# Patient Record
Sex: Male | Born: 1955
Health system: Southern US, Community
[De-identification: ages and names within clinical notes are randomized; demographics above are authoritative.]

## PROBLEM LIST (undated history)

## (undated) DIAGNOSIS — I10 Essential (primary) hypertension: Secondary | ICD-10-CM

## (undated) DIAGNOSIS — A048 Other specified bacterial intestinal infections: Secondary | ICD-10-CM

## (undated) DIAGNOSIS — J309 Allergic rhinitis, unspecified: Secondary | ICD-10-CM

## (undated) DIAGNOSIS — K259 Gastric ulcer, unspecified as acute or chronic, without hemorrhage or perforation: Secondary | ICD-10-CM

## (undated) DIAGNOSIS — E785 Hyperlipidemia, unspecified: Secondary | ICD-10-CM

## (undated) DIAGNOSIS — R011 Cardiac murmur, unspecified: Secondary | ICD-10-CM

## (undated) DIAGNOSIS — K922 Gastrointestinal hemorrhage, unspecified: Secondary | ICD-10-CM

## (undated) HISTORY — DX: Allergic rhinitis, unspecified: J30.9

## (undated) HISTORY — DX: Gastrointestinal hemorrhage, unspecified: K92.2

## (undated) HISTORY — DX: Other specified bacterial intestinal infections: A04.8

## (undated) HISTORY — DX: Cardiac murmur, unspecified: R01.1

## (undated) HISTORY — DX: Essential (primary) hypertension: I10

## (undated) HISTORY — PX: CLAVICLE SURGERY: SHX598

## (undated) HISTORY — DX: Gastric ulcer, unspecified as acute or chronic, without hemorrhage or perforation: K25.9

## (undated) HISTORY — PX: NASAL SEPTUM SURGERY: SHX37

## (undated) HISTORY — DX: Hyperlipidemia, unspecified: E78.5

## (undated) HISTORY — PX: APPENDECTOMY: SHX54

---

## 1998-12-25 ENCOUNTER — Emergency Department (HOSPITAL_COMMUNITY): Admission: EM | Admit: 1998-12-25 | Discharge: 1998-12-25 | Payer: Self-pay | Admitting: Emergency Medicine

## 2001-08-02 ENCOUNTER — Emergency Department (HOSPITAL_COMMUNITY): Admission: EM | Admit: 2001-08-02 | Discharge: 2001-08-02 | Payer: Self-pay | Admitting: Internal Medicine

## 2001-08-29 ENCOUNTER — Emergency Department (HOSPITAL_COMMUNITY): Admission: EM | Admit: 2001-08-29 | Discharge: 2001-08-29 | Payer: Self-pay | Admitting: Emergency Medicine

## 2001-10-16 ENCOUNTER — Emergency Department (HOSPITAL_COMMUNITY): Admission: EM | Admit: 2001-10-16 | Discharge: 2001-10-16 | Payer: Self-pay | Admitting: Emergency Medicine

## 2003-06-29 ENCOUNTER — Emergency Department (HOSPITAL_COMMUNITY): Admission: EM | Admit: 2003-06-29 | Discharge: 2003-06-29 | Payer: Self-pay | Admitting: Emergency Medicine

## 2004-11-24 ENCOUNTER — Ambulatory Visit: Payer: Self-pay | Admitting: Internal Medicine

## 2005-02-18 ENCOUNTER — Ambulatory Visit: Payer: Self-pay | Admitting: Internal Medicine

## 2006-03-25 ENCOUNTER — Ambulatory Visit: Payer: Self-pay | Admitting: Internal Medicine

## 2006-06-14 ENCOUNTER — Ambulatory Visit: Payer: Self-pay | Admitting: Internal Medicine

## 2007-06-23 ENCOUNTER — Ambulatory Visit: Payer: Self-pay | Admitting: Internal Medicine

## 2007-07-25 DIAGNOSIS — I10 Essential (primary) hypertension: Secondary | ICD-10-CM

## 2007-07-25 HISTORY — DX: Essential (primary) hypertension: I10

## 2007-10-24 ENCOUNTER — Ambulatory Visit: Payer: Self-pay | Admitting: Internal Medicine

## 2007-10-24 DIAGNOSIS — L0201 Cutaneous abscess of face: Secondary | ICD-10-CM | POA: Insufficient documentation

## 2007-10-24 DIAGNOSIS — J309 Allergic rhinitis, unspecified: Secondary | ICD-10-CM

## 2007-10-24 DIAGNOSIS — L03211 Cellulitis of face: Secondary | ICD-10-CM

## 2007-10-24 HISTORY — DX: Allergic rhinitis, unspecified: J30.9

## 2008-03-06 ENCOUNTER — Ambulatory Visit: Payer: Self-pay | Admitting: Internal Medicine

## 2008-03-06 DIAGNOSIS — J069 Acute upper respiratory infection, unspecified: Secondary | ICD-10-CM | POA: Insufficient documentation

## 2008-07-09 ENCOUNTER — Encounter: Payer: Self-pay | Admitting: Internal Medicine

## 2008-09-04 ENCOUNTER — Ambulatory Visit: Payer: Self-pay | Admitting: Internal Medicine

## 2008-09-19 ENCOUNTER — Telehealth: Payer: Self-pay | Admitting: Internal Medicine

## 2009-07-25 ENCOUNTER — Telehealth: Payer: Self-pay | Admitting: Internal Medicine

## 2009-07-30 ENCOUNTER — Ambulatory Visit: Payer: Self-pay | Admitting: Internal Medicine

## 2009-08-30 ENCOUNTER — Telehealth: Payer: Self-pay | Admitting: Internal Medicine

## 2010-10-09 ENCOUNTER — Ambulatory Visit: Payer: Self-pay | Admitting: Internal Medicine

## 2010-10-09 LAB — CONVERTED CEMR LAB
ALT: 32 units/L (ref 0–53)
AST: 26 units/L (ref 0–37)
Albumin: 3.9 g/dL (ref 3.5–5.2)
Alkaline Phosphatase: 53 units/L (ref 39–117)
BUN: 13 mg/dL (ref 6–23)
Basophils Absolute: 0 10*3/uL (ref 0.0–0.1)
Basophils Relative: 0.4 % (ref 0.0–3.0)
Bilirubin Urine: NEGATIVE
Bilirubin, Direct: 0.1 mg/dL (ref 0.0–0.3)
Blood in Urine, dipstick: NEGATIVE
CO2: 30 meq/L (ref 19–32)
Calcium: 9.3 mg/dL (ref 8.4–10.5)
Chloride: 98 meq/L (ref 96–112)
Cholesterol: 220 mg/dL — ABNORMAL HIGH (ref 0–200)
Creatinine, Ser: 0.9 mg/dL (ref 0.4–1.5)
Direct LDL: 163 mg/dL
Eosinophils Absolute: 0.1 10*3/uL (ref 0.0–0.7)
Eosinophils Relative: 2.2 % (ref 0.0–5.0)
GFR calc non Af Amer: 115.73 mL/min (ref >60)
Glucose, Bld: 98 mg/dL (ref 70–99)
Glucose, Urine, Semiquant: NEGATIVE
HCT: 39.8 % (ref 39.0–52.0)
HDL: 42.5 mg/dL (ref >39.00)
Hemoglobin: 13.7 g/dL (ref 13.0–17.0)
Ketones, urine, test strip: NEGATIVE
Lymphocytes Relative: 50 % — ABNORMAL HIGH (ref 12.0–46.0)
Lymphs Abs: 1.5 10*3/uL (ref 0.7–4.0)
MCHC: 34.5 g/dL (ref 30.0–36.0)
MCV: 96.3 fL (ref 78.0–100.0)
Monocytes Absolute: 0.3 10*3/uL (ref 0.1–1.0)
Monocytes Relative: 8.9 % (ref 3.0–12.0)
Neutro Abs: 1.2 10*3/uL — ABNORMAL LOW (ref 1.4–7.7)
Neutrophils Relative %: 38.5 % — ABNORMAL LOW (ref 43.0–77.0)
Nitrite: NEGATIVE
PSA: 1.47 ng/mL (ref 0.10–4.00)
Platelets: 236 10*3/uL (ref 150.0–400.0)
Potassium: 3.9 meq/L (ref 3.5–5.1)
Protein, U semiquant: NEGATIVE
RBC: 4.13 M/uL — ABNORMAL LOW (ref 4.22–5.81)
RDW: 14.4 % (ref 11.5–14.6)
Sodium: 136 meq/L (ref 135–145)
Specific Gravity, Urine: 1.01
TSH: 0.89 microintl units/mL (ref 0.35–5.50)
Total Bilirubin: 0.8 mg/dL (ref 0.3–1.2)
Total CHOL/HDL Ratio: 5
Total Protein: 7.1 g/dL (ref 6.0–8.3)
Triglycerides: 62 mg/dL (ref 0.0–149.0)
Urobilinogen, UA: 0.2
VLDL: 12.4 mg/dL (ref 0.0–40.0)
WBC Urine, dipstick: NEGATIVE
WBC: 3 10*3/uL — ABNORMAL LOW (ref 4.5–10.5)
pH: 7

## 2010-10-16 ENCOUNTER — Ambulatory Visit: Payer: Self-pay | Admitting: Internal Medicine

## 2010-10-16 DIAGNOSIS — E7800 Pure hypercholesterolemia, unspecified: Secondary | ICD-10-CM | POA: Insufficient documentation

## 2010-10-16 DIAGNOSIS — E785 Hyperlipidemia, unspecified: Secondary | ICD-10-CM

## 2010-10-16 HISTORY — DX: Hyperlipidemia, unspecified: E78.5

## 2010-11-05 ENCOUNTER — Telehealth: Payer: Self-pay | Admitting: Internal Medicine

## 2010-12-11 ENCOUNTER — Ambulatory Visit: Payer: Self-pay | Admitting: Internal Medicine

## 2010-12-12 DIAGNOSIS — R109 Unspecified abdominal pain: Secondary | ICD-10-CM | POA: Insufficient documentation

## 2011-01-29 NOTE — Assessment & Plan Note (Signed)
Summary: CPX//SLM   Vital Signs:  Patient profile:   55 year old male Height:      70 inches Weight:      174 pounds BMI:     25.06 Temp:     98.2 degrees F oral Pulse rate:   72 / minute Resp:     14 per minute BP sitting:   130 / 82  (left arm)  Vitals Entered By: Willy Eddy, LPN (October 16, 2010 3:01 PM) CC: cpx-pt didnt want to put on a gown Is Patient Diabetic? No   CC:  cpx-pt didnt want to put on a gown.  History of Present Illness: 55 -year-old patient who is seen today for a wellness exam.  He has a history of treated hypertension and has done quite well.  no concerns or complaints  Preventive Screening-Counseling & Management  Caffeine-Diet-Exercise     Does Patient Exercise: yes  Current Problems (verified): 1)  Contact With or Exposure To Venereal Diseases  (ICD-V01.6) 2)  Uri  (ICD-465.9) 3)  Cellulitis, Face, Left  (ICD-682.0) 4)  Allergic Rhinitis  (ICD-477.9) 5)  Hypertension  (ICD-401.9)  Current Medications (verified): 1)  Viagra 50 Mg Tabs (Sildenafil Citrate) .... One Tab By Mouth At Bedtime As Needed 2)  Fexofenadine Hcl 180 Mg  Tabs (Fexofenadine Hcl) .Marland Kitchen.. 1 Once Daily 3)  Benazepril-Hydrochlorothiazide 20-25 Mg Tabs (Benazepril-Hydrochlorothiazide) .... One Daily  Allergies (verified): 1)  ! Doxycycline  Contraindications/Deferment of Procedures/Staging:    Test/Procedure: FLU VAX    Reason for deferment: patient declined     Test/Procedure: Colonoscopy    Reason for deferment: patient declined   Past History:  Past Medical History: Hypertension Allergic rhinitis Hyperlipidemia  Past Surgical History: ENT surgery for septal deviation, turbinate hypertrophy, and nasal obstruction 2006 Appendectomy  age 26 broken collarbone, age 42 colonoscopy none  Family History: Reviewed history from 09/04/2008 and no changes required. positive for hypertension Father died age 12 COPD Mother died age 38 aneurysm  3 brothers- HTN 2  sisters-   Social History: Reviewed history from 09/04/2008 and no changes required. Single Regular exercise-yes works 2  jobsDoes Patient Exercise:  yes  Review of Systems  The patient denies anorexia, fever, weight loss, weight gain, vision loss, decreased hearing, hoarseness, chest pain, syncope, dyspnea on exertion, peripheral edema, prolonged cough, headaches, hemoptysis, abdominal pain, melena, hematochezia, severe indigestion/heartburn, hematuria, incontinence, genital sores, muscle weakness, suspicious skin lesions, transient blindness, difficulty walking, depression, unusual weight change, abnormal bleeding, enlarged lymph nodes, angioedema, breast masses, and testicular masses.    Physical Exam  General:  Well-developed,well-nourished,in no acute distress; alert,appropriate and cooperative throughout examination Head:  Normocephalic and atraumatic without obvious abnormalities. No apparent alopecia or balding. Eyes:  No corneal or conjunctival inflammation noted. EOMI. Perrla. Funduscopic exam benign, without hemorrhages, exudates or papilledema. Vision grossly normal. Ears:  External ear exam shows no significant lesions or deformities.  Otoscopic examination reveals clear canals, tympanic membranes are intact bilaterally without bulging, retraction, inflammation or discharge. Hearing is grossly normal bilaterally. Nose:  External nasal examination shows no deformity or inflammation. Nasal mucosa are pink and moist without lesions or exudates. Mouth:  Oral mucosa and oropharynx without lesions or exudates.  Teeth in good repair. Neck:  No deformities, masses, or tenderness noted. Chest Wall:  No deformities, masses, tenderness or gynecomastia noted. Breasts:  No masses or gynecomastia noted Lungs:  Normal respiratory effort, chest expands symmetrically. Lungs are clear to auscultation, no crackles or wheezes. Heart:  Normal  rate and regular rhythm. S1 and S2 normal without gallop,  murmur, click, rub or other extra sounds. Abdomen:  Bowel sounds positive,abdomen soft and non-tender without masses, organomegaly or hernias noted. Rectal:  No external abnormalities noted. Normal sphincter tone. No rectal masses or tenderness. Genitalia:  Testes bilaterally descended without nodularity, tenderness or masses. No scrotal masses or lesions. No penis lesions or urethral discharge. Prostate:  Prostate gland firm and smooth, no enlargement, nodularity, tenderness, mass, asymmetry or induration. Msk:  No deformity or scoliosis noted of thoracic or lumbar spine.   Pulses:  R and L carotid,radial,femoral,dorsalis pedis and posterior tibial pulses are full and equal bilaterally Extremities:  No clubbing, cyanosis, edema, or deformity noted with normal full range of motion of all joints.   Neurologic:  No cranial nerve deficits noted. Station and gait are normal. Plantar reflexes are down-going bilaterally. DTRs are symmetrical throughout. Sensory, motor and coordinative functions appear intact. Skin:  Intact without suspicious lesions or rashes Cervical Nodes:  No lymphadenopathy noted Axillary Nodes:  No palpable lymphadenopathy Inguinal Nodes:  No significant adenopathy Psych:  Cognition and judgment appear intact. Alert and cooperative with normal attention span and concentration. No apparent delusions, illusions, hallucinations   Impression & Recommendations:  Problem # 1:  Preventive Health Care (ICD-V70.0)  Complete Medication List: 1)  Viagra 50 Mg Tabs (Sildenafil citrate) .... One tab by mouth at bedtime as needed 2)  Fexofenadine Hcl 180 Mg Tabs (Fexofenadine hcl) .Marland Kitchen.. 1 once daily 3)  Benazepril-hydrochlorothiazide 20-25 Mg Tabs (Benazepril-hydrochlorothiazide) .... One daily  Patient Instructions: 1)  Limit your Sodium (Salt). 2)  It is important that you exercise regularly at least 20 minutes 5 times a week. If you develop chest pain, have severe difficulty breathing,  or feel very tired , stop exercising immediately and seek medical attention. 3)  Schedule a colonoscopy/sigmoidoscopy to help detect colon cancer. 4)  Check your Blood Pressure regularly. If it is above: 150/90  you should make an appointment. 5)  Please schedule a follow-up appointment in 6 months. Prescriptions: BENAZEPRIL-HYDROCHLOROTHIAZIDE 20-25 MG TABS (BENAZEPRIL-HYDROCHLOROTHIAZIDE) one daily  #90 Tablet x 6   Entered and Authorized by:   Gordy Savers  MD   Signed by:   Gordy Savers  MD on 10/16/2010   Method used:   Print then Give to Patient   RxID:   [id number] VIAGRA 50 MG TABS (SILDENAFIL CITRATE) one tab by mouth at bedtime as needed  #11 x 6   Entered and Authorized by:   Gordy Savers  MD   Signed by:   Gordy Savers  MD on 10/16/2010   Method used:   Print then Give to Patient   RxID:   [id number]    Orders Added: 1)  Est. Patient 40-64 years [[address]]

## 2011-01-29 NOTE — Assessment & Plan Note (Signed)
Summary: stomach virus//ccm   Vital Signs:  Patient profile:   55 year old male Weight:      173 pounds O2 Sat:      93 % on Room air Temp:     98.4 degrees F oral Pulse rate:   68 / minute BP sitting:   110 / 64  (left arm) Cuff size:   regular  Vitals Entered By: Romualdo Bolk, CMA (AAMA) (December 12, 2010 11:16 AM)  O2 Flow:  Room air CC: Stomach cramps, bloating, some nausea but no vomiting, no diarrhea or constipation x 2 weeks.   CC:  Stomach cramps, bloating, some nausea but no vomiting, and no diarrhea or constipation x 2 weeks.Marland Kitchen  History of Present Illness: Patient presents to clinic as a workin for evaluation of abdominal discomfort. Notes 2 wk h/o increased abdominal bloating, possible mild intermittent nausea and crampy periumbilical pain. Pain does not radiate and is not associated with food. No exacerbating factors. Believes lost 3lbs of wt over 2 weeks. Denies hematemesis, hematochezia or melena. Attempted antacids and zantac without improvment however 2-3 days of prilosec helped the symptoms.  Also notes possible sensation of intermittent food "hanging up" with swallowing in the low chest area.  Preventive Screening-Counseling & Management  Caffeine-Diet-Exercise     Does Patient Exercise: yes  Current Medications (verified): 1)  Viagra 50 Mg Tabs (Sildenafil Citrate) .... One Tab By Mouth At Bedtime As Needed 2)  Fexofenadine Hcl 180 Mg  Tabs (Fexofenadine Hcl) .Marland Kitchen.. 1 Once Daily 3)  Benazepril-Hydrochlorothiazide 20-25 Mg Tabs (Benazepril-Hydrochlorothiazide) .... One Daily  Allergies (verified): 1)  ! Doxycycline  Review of Systems      See HPI GI:  Complains of abdominal pain, gas, indigestion, and nausea; denies bloody stools, change in bowel habits, constipation, dark tarry stools, diarrhea, excessive appetite, vomiting, vomiting blood, and yellowish skin color.  Physical Exam  General:  Well-developed,well-nourished,in no acute distress;  alert,appropriate and cooperative throughout examination Head:  Normocephalic and atraumatic without obvious abnormalities. No apparent alopecia or balding. Eyes:  pupils equal, pupils round, corneas and lenses clear, and no injection.   Ears:  no external deformities.   Nose:  no external deformity.   Abdomen:  soft, non-tender, normal bowel sounds, no distention, no masses, no rigidity, no abdominal hernia, no hepatomegaly, and no splenomegaly.     Impression & Recommendations:  Problem # 1:  ABDOMINAL PAIN OTHER SPECIFIED SITE (ICD-789.09) Assessment New Exam nl. Suspect gastric etiology. Begin omeprazole 40mg  by mouth once daily x 1 month. Schedule close followup for re-evaluation. Consider EGD if dysphagia or abd pain persists greater than 3-4 wks on daily ppi. Avoid caffeine, alcohol and nsaids.  Complete Medication List: 1)  Viagra 50 Mg Tabs (Sildenafil citrate) .... One tab by mouth at bedtime as needed 2)  Fexofenadine Hcl 180 Mg Tabs (Fexofenadine hcl) .Marland Kitchen.. 1 once daily 3)  Benazepril-hydrochlorothiazide 20-25 Mg Tabs (Benazepril-hydrochlorothiazide) .... One daily 4)  Omeprazole 40 Mg Cpdr (Omeprazole) .... One by mouth qd Prescriptions: OMEPRAZOLE 40 MG CPDR (OMEPRAZOLE) one by mouth qd  #30 x 11   Entered and Authorized by:   Edwyna Perfect MD   Signed by:   Edwyna Perfect MD on 12/12/2010   Method used:   Electronically to        [id number] (ret[facility])       [address]       [place], [place]  [address]       Ph:  [phone] or [phone]       Fax: [phone]   RxID:   [id number]    Orders Added: 1)  Est. Patient Level IV [[address]]

## 2011-01-29 NOTE — Progress Notes (Signed)
  Phone Note Call from Patient   Caller: Patient Call For: Gordy Savers  MD Reason for Call: Acute Illness Action Taken: Provider Notified Summary of Call: Pt is asking for an antibiotic for a boil on buttocks. No Doxycycline. [facility] [phone] Initial call taken by: [facility] CMA AAMA,  November 05, 2010 11:04 AM  Follow-up for Phone Call        generic keflex 500  #30 one three times a day  Follow-up by: Gordy Savers  MD,  November 05, 2010 11:51 AM    New/Updated Medications: KEFLEX 500 MG CAPS (CEPHALEXIN) one by mouth tid Prescriptions: KEFLEX 500 MG CAPS (CEPHALEXIN) one by mouth tid  #30 x 0   Entered by:   Lynann Beaver CMA AAMA   Authorized by:   Gordy Savers  MD   Signed by:   Lynann Beaver CMA AAMA on 11/05/2010   Method used:   Electronically to        [id number] ([facility]       [address]       [place], [place]  [address]       Ph: [phone] or [phone]       Fax: [phone]   RxID:   [id number]

## 2011-01-29 NOTE — Assessment & Plan Note (Signed)
Summary: head congestion/jls   Vital Signs:  Patient Profile:   55 Years Old Male Weight:      175 pounds Temp:     98.2 degrees F oral BP sitting:   160 / 102  (left arm) Cuff size:   regular  Vitals Entered By: Raechel Ache, RN (March 06, 2008 10:36 AM)                 Chief Complaint:  C/o head congestion and achiness and BP up..  History of Present Illness: 55 year old gentleman seen today for follow-up.  He has a history of a hypertension, now controlled off medications for the past 3 days.  She has had head and chest congestion, sore throat, weakness, headache, and a general sense of unwellness.  He has been consuming considerable to do to his illness with high sodium intake.  Blood pressure readings at home have been consistently high    Current Allergies: ! DOXYCYCLINE      Physical Exam  General:     weak but in no acute distress Head:     Normocephalic and atraumatic without obvious abnormalities. No apparent alopecia or balding. Eyes:     No corneal or conjunctival inflammation noted. EOMI. Perrla. Funduscopic exam benign, without hemorrhages, exudates or papilledema. Vision grossly normal. Ears:     External ear exam shows no significant lesions or deformities.  Otoscopic examination reveals clear canals, tympanic membranes are intact bilaterally without bulging, retraction, inflammation or discharge. Hearing is grossly normal bilaterally. Mouth:     oropharynx slightly injected Neck:     supple no adenopathy Lungs:     Normal respiratory effort, chest expands symmetrically. Lungs are clear to auscultation, no crackles or wheezes. Heart:     Normal rate and regular rhythm. S1 and S2 normal without gallop, murmur, click, rub or other extra sounds. Abdomen:     Bowel sounds positive,abdomen soft and non-tender without masses, organomegaly or hernias noted.    Impression & Recommendations:  Problem # 1:  URI (ICD-465.9)  Problem # 2:  HYPERTENSION  (ICD-401.9)  Complete Medication List: 1)  Viagra 50 Mg Tabs (Sildenafil citrate)   Patient Instructions: 1)  Check your Blood Pressure regularly. If it is above: you should make an appointment. 2)  Acute sinusitis symptoms for less than 10 days are not helped by antibiotics.Use warm moist compresses, and over the counter decongestants ( only as directed). Call if no improvement in 5-7 days, sooner if increasing pain, fever, or new symptoms.    ]

## 2011-01-29 NOTE — Assessment & Plan Note (Signed)
Summary: blood pressure/mhf   Vital Signs:  Patient Profile:   55 Years Old Male Weight:      177 pounds Temp:     98 degrees F oral BP sitting:   142 / 104  (left arm) Cuff size:   regular  Vitals Entered By: Raechel Ache, RN (September 04, 2008 10:22 AM)                 Chief Complaint:  ROV; check BP.Jay Robertson  History of Present Illness: 55 year old male seen today for follow-up of his hypertension.  He has been checking blood pressures at home and frequently, and often gets elevated readings.  He states that he was exposed to chlamydia with a recent sexual contact.    Current Allergies: ! DOXYCYCLINE  Past Medical History:    Reviewed history from 10/24/2007 and no changes required:       Hypertension       Allergic rhinitis  Past Surgical History:    Reviewed history from 10/24/2007 and no changes required:       ENT surgery for septal deviation, turbinate hypertrophy, and nasal obstruction 2006       Appendectomy H12       broken collarbone, age 89   Family History:    positive for hypertension, and diabetes  Social History:    Single    Review of Systems  The patient denies anorexia, fever, weight loss, weight gain, vision loss, decreased hearing, hoarseness, chest pain, syncope, dyspnea on exertion, peripheral edema, prolonged cough, headaches, hemoptysis, abdominal pain, melena, hematochezia, severe indigestion/heartburn, hematuria, incontinence, genital sores, muscle weakness, suspicious skin lesions, transient blindness, difficulty walking, depression, unusual weight change, abnormal bleeding, enlarged lymph nodes, angioedema, breast masses, and testicular masses.     Physical Exam  General:     Well-developed,well-nourished,in no acute distress; alert,appropriate and cooperative throughout examination;  blood pressure 140 to 144/100 Head:     Normocephalic and atraumatic without obvious abnormalities. No apparent alopecia or balding. Eyes:     No  corneal or conjunctival inflammation noted. EOMI. Perrla. Funduscopic exam benign, without hemorrhages, exudates or papilledema. Vision grossly normal. Neck:     No deformities, masses, or tenderness noted. Lungs:     Normal respiratory effort, chest expands symmetrically. Lungs are clear to auscultation, no crackles or wheezes. Heart:     Normal rate and regular rhythm. S1 and S2 normal without gallop, murmur, click, rub or other extra sounds.    Impression & Recommendations:  Problem # 1:  HYPERTENSION (ICD-401.9)  His updated medication list for this problem includes:    Benazepril Hcl 20 Mg Tabs (Benazepril hcl) ..... One daily   Problem # 2:  CONTACT WITH OR EXPOSURE TO VENEREAL DISEASES (ICD-V01.6) will treat with doxycycline for 10 days  Complete Medication List: 1)  Viagra 50 Mg Tabs (Sildenafil citrate) 2)  Fexofenadine Hcl 180 Mg Tabs (Fexofenadine hcl) .Jay Robertson.. 1 once daily 3)  Benazepril Hcl 20 Mg Tabs (Benazepril hcl) .... One daily 4)  Azithromycin 250 Mg Tabs (Azithromycin) .... Daily for 3 days   Patient Instructions: 1)  return office visit 6 weeks 2)  Limit your Sodium (Salt) to less than 2 grams a day(slightly less than 1/2 a teaspoon) to prevent fluid retention, swelling, or worsening of symptoms. 3)  It is important that you exercise regularly at least 20 minutes 5 times a week. If you develop chest pain, have severe difficulty breathing, or feel very tired , stop exercising immediately  and seek medical attention.   Prescriptions: AZITHROMYCIN 250 MG TABS (AZITHROMYCIN) daily for 3 days  #6 x 0   Entered and Authorized by:   Gordy Savers  MD   Signed by:   Gordy Savers  MD on 09/04/2008   Method used:   Print then Give to Patient   RxID:   [id number] DOXYCYCLINE HYCLATE 100 MG CAPS (DOXYCYCLINE HYCLATE) one twice daily  #20 x 0   Entered and Authorized by:   Gordy Savers  MD   Signed by:   Gordy Savers  MD on 09/04/2008    Method used:   Print then Give to Patient   RxID:   [id number] BENAZEPRIL HCL 20 MG TABS (BENAZEPRIL HCL) one daily  #90 x 4   Entered and Authorized by:   Gordy Savers  MD   Signed by:   Gordy Savers  MD on 09/04/2008   Method used:   Print then Give to Patient   RxID:   [id number] FEXOFENADINE HCL 180 MG  TABS (FEXOFENADINE HCL) 1 once daily  #50 Tablet x 2   Entered and Authorized by:   Gordy Savers  MD   Signed by:   Gordy Savers  MD on 09/04/2008   Method used:   Print then Give to Patient   RxID:   [id number] VIAGRA 50 MG TABS (SILDENAFIL CITRATE)   #12 Tablet x 11   Entered and Authorized by:   Gordy Savers  MD   Signed by:   Gordy Savers  MD on 09/04/2008   Method used:   Print then Give to Patient   RxID:   [id number]  ]

## 2011-01-29 NOTE — Assessment & Plan Note (Signed)
Summary: KNOT ON HEAD/MHF  Medications Added VIAGRA 50 MG TABS (SILDENAFIL CITRATE)  CEPHALEXIN 500 MG  CAPS (CEPHALEXIN) one three times daily        Vital Signs:  Patient Profile:   55 Years Old Male Weight:      174 pounds Temp:     98.1 degrees F oral BP sitting:   122 / 88  (left arm)  Vitals Entered By: Raechel Ache, RN (October 24, 2007 11:15 AM)                 Chief Complaint:  C/o sore bump L ear. Has had other sores on face.Marland Kitchen  History of Present Illness: 55 year old patient is soft tissues swelling and pain involving the left ear lobe. His girlfriend decompressed lesion last night with a reduction in discomfort.  Current Allergies: ! DOXYCYCLINE  Past Medical History:    Hypertension    Allergic rhinitis  Past Surgical History:    ENT surgery for septal deviation, turbinate hypertrophy, and nasal obstruction 2006    Appendectomy H12    broken collarbone, age 49   Family History:    positive for hypertension, and diabetes, and     Physical Exam  General:     Well-developed,well-nourished,in no acute distress; alert,appropriate and cooperative throughout examination Skin:     patient had minimal swelling without fluctuance and mild erythema involving the left earlobe    Impression & Recommendations:  Problem # 1:  CELLULITIS, FACE, LEFT (ICD-682.0)  His updated medication list for this problem includes:    Cephalexin 500 Mg Caps (Cephalexin) ..... One three times daily local skin care discussed  Complete Medication List: 1)  Viagra 50 Mg Tabs (Sildenafil citrate) 2)  Cephalexin 500 Mg Caps (Cephalexin) .... One three times daily   Patient Instructions: 1)  Please schedule a follow-up appointment in 4 months for annual exam 2)  Limit your Sodium (Salt). 3)  It is important that you exercise regularly at least 20 minutes 5 times a week. If you develop chest pain, have severe difficulty breathing, or feel very tired , stop exercising  immediately and seek medical attention.    Prescriptions: CEPHALEXIN 500 MG  CAPS (CEPHALEXIN) one three times daily  #30 x 0   Entered and Authorized by:   Gordy Savers  MD   Signed by:   Gordy Savers  MD on 10/24/2007   Method used:   Print then Give to Patient   RxID:   [id number]  ]

## 2011-01-29 NOTE — Progress Notes (Signed)
  Phone Note Call from Patient   Caller: Patient Call For: Jay Savers  MD Summary of Call: calling to ask about status of FMLA form ph [phone] Initial call taken by: Raechel Ache, RN,  August 30, 2009 11:04 AM  Follow-up for Phone Call        will complete today Follow-up by: Jay Savers  MD,  August 30, 2009 12:24 PM  Additional Follow-up for Phone Call Additional follow up Details #1::        called for pick-up. Additional Follow-up by: Raechel Ache, RN,  August 30, 2009 12:35 PM

## 2011-01-29 NOTE — Progress Notes (Signed)
Summary: elevated B/P and FMLA papers  Phone Note Call from Patient   Summary of Call: Patient states his blood pressure is 148/93 today. He has taken his medication today. Patient also has FMLA papers he needs to drop off to be signed. Patient can be reached or leave a message at [phone]. Initial call taken by: Darra Lis RMA,  July 25, 2009 12:10 PM    needs ROV this week  Appended Document: elevated B/P and FMLA papers Appointment made and patient informed.

## 2011-01-29 NOTE — Progress Notes (Signed)
Summary: sinus med  Phone Note Call from Patient Call back at [phone]   Caller: live Call For: kwiat Summary of Call: Took up the arithromycin.  The erythromycin works better.  [facility] round [facility] to [facility] for sinus problem not resolved.  Allergic to doxycycline - gives headache.   Initial call taken by: Rudy Jew, RN,  September 19, 2008 8:25 AM  Follow-up for Phone Call        Erythromycin 500   #40 one QID Follow-up by: Gordy Savers  MD,  September 19, 2008 10:09 AM      Appended Document: sinus med [facility] for patient.  Appended Document: sinus med Pt made aware of med at his pharmacy

## 2011-01-29 NOTE — Assessment & Plan Note (Signed)
Summary: elevated B/P/LC   Vital Signs:  Patient profile:   55 year old male Height:      70 inches Weight:      176 pounds BMI:     25.34 Temp:     98.4 degrees F oral Pulse rate:   76 / minute Resp:     14 per minute BP sitting:   140 / 88  (left arm)  Vitals Entered By: Willy Eddy, LPN (July 30, 2009 2:50 PM)  CC:  roa- bp check and fmla completion.  History of Present Illness: 55 year old patient who is in today for follow-up of his hypertension; he has been controlled on Benazapril 20 mg daily.  He is doing quite well, but complains of job-related stress.  He requests FMLA form completion.  No other concerns or complaints.  He does track.  Blood pressure readings at home usually high normal to mildly elevated  Problems Prior to Update: 1)  Contact With or Exposure To Venereal Diseases  (ICD-V01.6) 2)  Uri  (ICD-465.9) 3)  Cellulitis, Face, Left  (ICD-682.0) 4)  Allergic Rhinitis  (ICD-477.9) 5)  Hypertension  (ICD-401.9)  Allergies: 1)  ! Doxycycline  Past History:  Past Medical History: Reviewed history from 10/24/2007 and no changes required. Hypertension Allergic rhinitis  Family History: Reviewed history from 09/04/2008 and no changes required. positive for hypertension, and diabetes  Review of Systems  The patient denies anorexia, fever, weight loss, weight gain, vision loss, decreased hearing, hoarseness, chest pain, syncope, dyspnea on exertion, peripheral edema, prolonged cough, headaches, hemoptysis, abdominal pain, melena, hematochezia, severe indigestion/heartburn, hematuria, incontinence, genital sores, muscle weakness, suspicious skin lesions, transient blindness, difficulty walking, depression, unusual weight change, abnormal bleeding, enlarged lymph nodes, angioedema, breast masses, and testicular masses.    Physical Exam  General:  Well-developed,well-nourished,in no acute distress; alert,appropriate and cooperative throughout examination;  140 to 150 systolic over 88 to 100 diastolic Head:  Normocephalic and atraumatic without obvious abnormalities. No apparent alopecia or balding. Eyes:  No corneal or conjunctival inflammation noted. EOMI. Perrla. Funduscopic exam benign, without hemorrhages, exudates or papilledema. Vision grossly normal. Mouth:  Oral mucosa and oropharynx without lesions or exudates.  Teeth in good repair. Neck:  No deformities, masses, or tenderness noted. Lungs:  Normal respiratory effort, chest expands symmetrically. Lungs are clear to auscultation, no crackles or wheezes. Heart:  Normal rate and regular rhythm. S1 and S2 normal without gallop, murmur, click, rub or other extra sounds. Abdomen:  Bowel sounds positive,abdomen soft and non-tender without masses, organomegaly or hernias noted. Msk:  No deformity or scoliosis noted of thoracic or lumbar spine.   Pulses:  R and L carotid,radial,femoral,dorsalis pedis and posterior tibial pulses are full and equal bilaterally Extremities:  No clubbing, cyanosis, edema, or deformity noted with normal full range of motion of all joints.     Impression & Recommendations:  Problem # 1:  HYPERTENSION (ICD-401.9)  The following medications were removed from the medication list:    Benazepril Hcl 20 Mg Tabs (Benazepril hcl) ..... One daily His updated medication list for this problem includes:    Benazepril-hydrochlorothiazide 20-25 Mg Tabs (Benazepril-hydrochlorothiazide) ..... One daily  The following medications were removed from the medication list:    Benazepril Hcl 20 Mg Tabs (Benazepril hcl) ..... One daily His updated medication list for this problem includes:    Benazepril-hydrochlorothiazide 20-25 Mg Tabs (Benazepril-hydrochlorothiazide) ..... One daily  Complete Medication List: 1)  Viagra 50 Mg Tabs (Sildenafil citrate) 2)  Fexofenadine Hcl  180 Mg Tabs (Fexofenadine hcl) .Marland Kitchen.. 1 once daily 3)  Azithromycin 250 Mg Tabs (Azithromycin) .... Daily for 3  days 4)  Benazepril-hydrochlorothiazide 20-25 Mg Tabs (Benazepril-hydrochlorothiazide) .... One daily  Patient Instructions: 1)  Please schedule a follow-up appointment in 6 months for CPX 2)  Limit your Sodium (Salt). 3)  It is important that you exercise regularly at least 20 minutes 5 times a week. If you develop chest pain, have severe difficulty breathing, or feel very tired , stop exercising immediately and seek medical attention. 4)  Check your Blood Pressure regularly. If it is above: 140/90 you should make an appointment. Prescriptions: BENAZEPRIL-HYDROCHLOROTHIAZIDE 20-25 MG TABS (BENAZEPRIL-HYDROCHLOROTHIAZIDE) one daily  #90 x 4   Entered and Authorized by:   Gordy Savers  MD   Signed by:   Gordy Savers  MD on 07/30/2009   Method used:   Print then Give to Patient   RxID:   [id number] FEXOFENADINE HCL 180 MG  TABS (FEXOFENADINE HCL) 1 once daily  #50 Tablet x 3   Entered and Authorized by:   Gordy Savers  MD   Signed by:   Gordy Savers  MD on 07/30/2009   Method used:   Print then Give to Patient   RxID:   [id number] VIAGRA 50 MG TABS (SILDENAFIL CITRATE)   #12 Tablet x 11   Entered and Authorized by:   Gordy Savers  MD   Signed by:   Gordy Savers  MD on 07/30/2009   Method used:   Print then Give to Patient   RxID:   [id number]

## 2011-01-29 NOTE — Miscellaneous (Signed)
  Clinical Lists Changes  Medications: Added new medication of FEXOFENADINE HCL 180 MG  TABS (FEXOFENADINE HCL) 1 once daily

## 2011-01-29 NOTE — Letter (Signed)
Summary: Out of Work  Adult nurse at [employer]  [address]   [place], [place] [address]   Phone: [phone]  Fax: [phone]    March 06, 2008   Employee:  WINDLE HUEBERT    To Whom It May Concern:   For Medical reasons, please excuse the above named employee from work for the following dates:  Start:   03-05-08  End:   03-09-08  If you need additional information, please feel free to contact our office.         Sincerely,    Gordy Savers  MD

## 2011-01-29 NOTE — Letter (Signed)
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         Sincerely,    Gordy Savers  MD

## 2011-02-13 ENCOUNTER — Telehealth: Payer: Self-pay | Admitting: Internal Medicine

## 2011-02-13 ENCOUNTER — Encounter: Payer: Self-pay | Admitting: Internal Medicine

## 2011-02-13 ENCOUNTER — Ambulatory Visit (INDEPENDENT_AMBULATORY_CARE_PROVIDER_SITE_OTHER): Payer: Federal, State, Local not specified - PPO | Admitting: Internal Medicine

## 2011-02-13 VITALS — BP 110/78 | Temp 98.2°F | Wt 178.0 lb

## 2011-02-13 DIAGNOSIS — I10 Essential (primary) hypertension: Secondary | ICD-10-CM

## 2011-02-13 DIAGNOSIS — R369 Urethral discharge, unspecified: Secondary | ICD-10-CM

## 2011-02-13 LAB — POCT URINALYSIS DIPSTICK
Bilirubin, UA: NEGATIVE
Glucose, UA: NEGATIVE
Ketones, UA: NEGATIVE
Nitrite, UA: NEGATIVE
Spec Grav, UA: 1.01
Urobilinogen, UA: 0.2
pH, UA: 7.5

## 2011-02-13 MED ORDER — CIPROFLOXACIN HCL 500 MG PO TABS: 500.0000 mg | ORAL_TABLET | Freq: Two times a day (BID) | ORAL | Status: AC

## 2011-02-13 MED ORDER — CIPROFLOXACIN HCL 500 MG PO TABS: 500.0000 mg | ORAL_TABLET | Freq: Two times a day (BID) | ORAL | Status: DC

## 2011-02-13 NOTE — Telephone Encounter (Signed)
Error------pt will come in today for an uti.

## 2011-02-13 NOTE — Progress Notes (Signed)
  Subjective:    Patient ID: Jay Robertson, male    DOB: 1956-01-01, 55 y.o.   MRN: [id number]  HPI 55 year old patient who has a history of treated hypertension.  This has been stable.  For the past two days.  He has noted a scanty urethral discharge.  There's been no penile lesions, dysuria, frequency.  He states that he has had some Unprotected sex.  He has had treatment for a urethral discharges in the past. Review of Systems  Genitourinary: Positive for discharge. Negative for dysuria, urgency, frequency, hematuria, decreased urine volume, penile swelling, scrotal swelling, difficulty urinating, genital sores, penile pain and testicular pain.       Objective:   Physical Exam  Constitutional: He appears well-developed and well-nourished. No distress.       Normal blood pressure  Genitourinary: Penis normal. No penile tenderness.          Assessment & Plan:  Urethral discharge-  Will treat with 7 days of Cipro 500 mg twice daily Hypertension stable

## 2011-02-13 NOTE — Patient Instructions (Signed)
Take your antibiotic as prescribed until ALL of it is gone, but stop if you develop a rash, swelling, or any side effects of the medication.  Contact our office as soon as possible if  there are side effects of the medication.  Limit your sodium (Salt) intake  Return in 6 months for follow-up

## 2011-05-12 ENCOUNTER — Telehealth: Payer: Self-pay | Admitting: Internal Medicine

## 2011-05-12 MED ORDER — BENAZEPRIL HCL 20 MG PO TABS: 20.0000 mg | ORAL_TABLET | Freq: Every day | ORAL | Status: DC

## 2011-05-12 MED ORDER — SPIRONOLACTONE 25 MG PO TABS: 25.0000 mg | ORAL_TABLET | Freq: Every day | ORAL | Status: DC

## 2011-05-12 NOTE — Telephone Encounter (Signed)
Please advise 

## 2011-05-12 NOTE — Telephone Encounter (Signed)
Attempt to call - VM - LMTCB if questions - new med rx's sent to pharmacy. Dc lotensin HC and start spironlactone and lotensin

## 2011-05-12 NOTE — Telephone Encounter (Signed)
Wants to change his fluid pill where the sun will not affect him. He is out in the sun all the time. Please advise.

## 2011-05-12 NOTE — Telephone Encounter (Signed)
OK to change to spironolactone 25  #90 one daily

## 2011-10-15 ENCOUNTER — Encounter: Payer: Self-pay | Admitting: Internal Medicine

## 2011-10-15 ENCOUNTER — Ambulatory Visit (INDEPENDENT_AMBULATORY_CARE_PROVIDER_SITE_OTHER): Payer: Federal, State, Local not specified - PPO | Admitting: Internal Medicine

## 2011-10-15 DIAGNOSIS — I10 Essential (primary) hypertension: Secondary | ICD-10-CM

## 2011-10-15 DIAGNOSIS — N529 Male erectile dysfunction, unspecified: Secondary | ICD-10-CM

## 2011-10-15 DIAGNOSIS — E785 Hyperlipidemia, unspecified: Secondary | ICD-10-CM

## 2011-10-15 LAB — TESTOSTERONE: Testosterone: 332.81 ng/dL — ABNORMAL LOW (ref 350.00–890.00)

## 2011-10-15 NOTE — Patient Instructions (Signed)
Limit your sodium (Salt) intake    It is important that you exercise regularly, at least 20 minutes 3 to 4 times per week.  If you develop chest pain or shortness of breath seek  medical attention.  Please check your blood pressure on a regular basis.  If it is consistently greater than 150/90, please make an office appointment.  Return in 6 months for follow-up   

## 2011-10-15 NOTE — Progress Notes (Signed)
  Subjective:    Patient ID: Jay Robertson, male    DOB: 06-23-1956, 55 y.o.   MRN: [id number]  HPI  55 year old patient who is in today for followup of his hypertension. He has mild dyslipidemia and a history of allergic rhinitis he is doing quite well. He has used Viagra due to ED. He is asking about a testosterone supplement. In general doing quite well. 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    Review of Systems  Constitutional: Positive for fatigue. Negative for fever, chills and appetite change.  HENT: Negative for hearing loss, ear pain, congestion, sore throat, trouble swallowing, neck stiffness, dental problem, voice change and tinnitus.   Eyes: Negative for pain, discharge and visual disturbance.  Respiratory: Negative for cough, chest tightness, wheezing and stridor.   Cardiovascular: Negative for chest pain, palpitations and leg swelling.  Gastrointestinal: Negative for nausea, vomiting, abdominal pain, diarrhea, constipation, blood in stool and abdominal distention.  Genitourinary: Negative for urgency, hematuria, flank pain, discharge, difficulty urinating and genital sores.  Musculoskeletal: Negative for myalgias, back pain, joint swelling, arthralgias and gait problem.  Skin: Negative for rash.  Neurological: Negative for dizziness, syncope, speech difficulty, weakness, numbness and headaches.  Hematological: Negative for adenopathy. Does not bruise/bleed easily.  Psychiatric/Behavioral: Negative for behavioral problems and dysphoric mood. The patient is not nervous/anxious.        Objective:   Physical Exam  Constitutional: He is oriented to person, place, and time. He appears well-developed.  HENT:  Head: Normocephalic.  Right Ear: External ear normal.  Left Ear: External ear normal.  Eyes: Conjunctivae and EOM are normal.  Neck: Normal range of motion.  Cardiovascular: Normal rate and normal heart sounds.   Pulmonary/Chest: Breath sounds normal.    Abdominal: Bowel sounds are normal.  Musculoskeletal: Normal range of motion. He exhibits no edema and no tenderness.  Neurological: He is alert and oriented to person, place, and time.  Psychiatric: He has a normal mood and affect. His behavior is normal.          Assessment & Plan:   Hypertension stable Allergic rhinitis ED  . We'll check a testosterone level at his request

## 2011-10-16 NOTE — Progress Notes (Signed)
Quick Note:  Attempt to call - VM - lmtcb if questions - lab normal repeat in 1 year ______

## 2011-10-20 ENCOUNTER — Other Ambulatory Visit: Payer: Self-pay | Admitting: Internal Medicine

## 2012-01-06 ENCOUNTER — Telehealth: Payer: Self-pay | Admitting: Internal Medicine

## 2012-01-06 MED ORDER — CEPHALEXIN 500 MG PO CAPS: 500.0000 mg | ORAL_CAPSULE | Freq: Four times a day (QID) | ORAL | Status: DC

## 2012-01-06 NOTE — Telephone Encounter (Signed)
Cephalexin 500 mg #28 one 4 times a day

## 2012-01-06 NOTE — Telephone Encounter (Signed)
Cephalexin 500 mg #28 one 4 times a day 

## 2012-01-06 NOTE — Telephone Encounter (Signed)
Pt is getting a boil and is requesting an rx be called in [address]

## 2012-01-06 NOTE — Telephone Encounter (Signed)
done

## 2012-01-06 NOTE — Telephone Encounter (Signed)
Please advise 

## 2012-02-03 ENCOUNTER — Other Ambulatory Visit: Payer: Self-pay | Admitting: Internal Medicine

## 2012-02-04 NOTE — Telephone Encounter (Signed)
ok 

## 2012-02-04 NOTE — Telephone Encounter (Signed)
Refill request for Kelfex - last seen 10/15/11 for med refills - last call 01/06/12 for abx for boil  Please advise

## 2012-02-16 ENCOUNTER — Encounter: Payer: Self-pay | Admitting: Internal Medicine

## 2012-02-16 ENCOUNTER — Ambulatory Visit (INDEPENDENT_AMBULATORY_CARE_PROVIDER_SITE_OTHER): Payer: Federal, State, Local not specified - PPO | Admitting: Internal Medicine

## 2012-02-16 VITALS — BP 132/90 | Temp 98.2°F | Wt 173.0 lb

## 2012-02-16 DIAGNOSIS — M545 Low back pain, unspecified: Secondary | ICD-10-CM

## 2012-02-16 DIAGNOSIS — N390 Urinary tract infection, site not specified: Secondary | ICD-10-CM

## 2012-02-16 DIAGNOSIS — I10 Essential (primary) hypertension: Secondary | ICD-10-CM

## 2012-02-16 LAB — POCT URINALYSIS DIPSTICK
Bilirubin, UA: NEGATIVE
Glucose, UA: NEGATIVE
Nitrite, UA: POSITIVE
Spec Grav, UA: 1.01
Urobilinogen, UA: 0.2
pH, UA: 5.5

## 2012-02-16 MED ORDER — CIPROFLOXACIN HCL 500 MG PO TABS: 500.0000 mg | ORAL_TABLET | Freq: Two times a day (BID) | ORAL | Status: AC

## 2012-02-16 NOTE — Progress Notes (Signed)
  Subjective:    Patient ID: Jay Robertson, male    DOB: Jan 25, 1956, 56 y.o.   MRN: [id number]  HPI  56 year old patient who is in today for followup. He has treated hypertension. On the weekend he noted some hematuria he also describes some fullness in suprapubic area when he voids but no true dysuria he does have a history of STDs in the past but no history of urethral discharge. He has been in a monogamous relationship for about one year.    Review of Systems  Genitourinary: Positive for hematuria. Negative for discharge.  Musculoskeletal: Positive for back pain.       Objective:   Physical Exam  Constitutional: He appears well-developed and well-nourished. No distress.       Blood pressure 124/80          Assessment & Plan:   Probable UTI. Urinalysis reviewed and did reveal +3 blood as well as +2 leukocytes. Will treat with Cipro for 7 days he is asked to return for repeat urinalysis in 3-4 weeks. If hematuria persists will need urological evaluation Hypertension stable Will set up for a complete physical in 3 months

## 2012-02-16 NOTE — Patient Instructions (Signed)
Return in 3 weeks to have a repeat urinalysis checked  Take your antibiotic as prescribed until ALL of it is gone, but stop if you develop a rash, swelling, or any side effects of the medication.  Contact our office as soon as possible if  there are side effects of the medication.  Annual examination in 3 months

## 2012-02-18 DIAGNOSIS — Z0289 Encounter for other administrative examinations: Secondary | ICD-10-CM

## 2012-03-09 ENCOUNTER — Other Ambulatory Visit: Payer: Federal, State, Local not specified - PPO

## 2012-04-07 ENCOUNTER — Other Ambulatory Visit: Payer: Federal, State, Local not specified - PPO

## 2012-04-14 ENCOUNTER — Encounter: Payer: Federal, State, Local not specified - PPO | Admitting: Internal Medicine

## 2012-06-08 ENCOUNTER — Other Ambulatory Visit: Payer: Self-pay | Admitting: Internal Medicine

## 2012-06-23 ENCOUNTER — Telehealth: Payer: Self-pay | Admitting: Internal Medicine

## 2012-06-23 NOTE — Telephone Encounter (Signed)
Caller: Myron/Patient; PCP: Eleonore Chiquito; CB#: [phone];  Call regarding Irregular Heart Beat;  Patient states he has been experiencing intermittent "heart fluttering", onset X 3 weeks. Denies chest pain. States intermittently accompanied by shortness of breath. States heart rate does not feel rapid, but feels irregular. States episode started 06/23/12 1100. States intermittent since 1100 06/23/12. Denies feeling weak or dizzy. Denies shortness of breath at present. Pulse rate 62. BP 141/74. States has noticed decreased energy at work.  Triage per Irregular Heartbeat Protocol. Care advice given per guidelines. Patient advised to avoid Caffeine/Stimulants. Patient advised to change positions slowly. Patient advised not to drive. Call back parameters reviewed. Patient verbalizes understanding.  RN spoke with Nelva Bush in office and requested to speak with [place] or [place]. States neither are available. RN spoke wtih Hal Morales in office. Informed of above. States discussed above sx with Dr. Frederica Kuster. Orders received: If heart rate is 100 or above with sx, patient to go to UC, if pulse rate is below 100, patient may seen in office 06/24/12. Patient advised of above. Patient declines offered appt. for 06/24/12 a.m. Patient requests afternoon appt. Appt. scheduled for 06/24/12 1430 with Dr. Frederica Kuster. Call back parameters reviewed. Patient verbalizes understanding.

## 2012-06-24 ENCOUNTER — Ambulatory Visit (INDEPENDENT_AMBULATORY_CARE_PROVIDER_SITE_OTHER): Payer: Federal, State, Local not specified - PPO | Admitting: Internal Medicine

## 2012-06-24 ENCOUNTER — Encounter: Payer: Self-pay | Admitting: Internal Medicine

## 2012-06-24 VITALS — BP 110/80 | Temp 97.8°F | Wt 174.0 lb

## 2012-06-24 DIAGNOSIS — I10 Essential (primary) hypertension: Secondary | ICD-10-CM

## 2012-06-24 DIAGNOSIS — R002 Palpitations: Secondary | ICD-10-CM

## 2012-06-24 NOTE — Progress Notes (Signed)
  Subjective:    Patient ID: Jay Robertson, male    DOB: Nov 03, 1956, 56 y.o.   MRN: [id number]  HPI 56 year old patient who has a history of treated hypertension. He also has a history of mild allergic rhinitis. He presents today complaining of irregular heartbeat. He states that he is under considerable situational stress due to to work and financial issues. He states he works 2 jobs and gets only 3-1/2 hours of sleep daily. He denies any excessive caffeine use although does consume a fairly modest amount. No decongestant use. Denies any exertional chest pain    Review of Systems  Constitutional: Negative for fever, chills, appetite change and fatigue.  HENT: Negative for hearing loss, ear pain, congestion, sore throat, trouble swallowing, neck stiffness, dental problem, voice change and tinnitus.   Eyes: Negative for pain, discharge and visual disturbance.  Respiratory: Negative for cough, chest tightness, wheezing and stridor.   Cardiovascular: Positive for palpitations. Negative for chest pain and leg swelling.  Gastrointestinal: Negative for nausea, vomiting, abdominal pain, diarrhea, constipation, blood in stool and abdominal distention.  Genitourinary: Negative for urgency, hematuria, flank pain, discharge, difficulty urinating and genital sores.  Musculoskeletal: Negative for myalgias, back pain, joint swelling, arthralgias and gait problem.  Skin: Negative for rash.  Neurological: Negative for dizziness, syncope, speech difficulty, weakness, numbness and headaches.  Hematological: Negative for adenopathy. Does not bruise/bleed easily.  Psychiatric/Behavioral: Negative for behavioral problems and dysphoric mood. The patient is not nervous/anxious.        Objective:   Physical Exam  Constitutional: He is oriented to person, place, and time. He appears well-developed.       Blood pressure 110/80  HENT:  Head: Normocephalic.  Right Ear: External ear normal.  Left Ear: External ear  normal.  Eyes: Conjunctivae and EOM are normal.  Neck: Normal range of motion.  Cardiovascular: Normal rate and normal heart sounds.   Pulmonary/Chest: Breath sounds normal.  Abdominal: Bowel sounds are normal.  Musculoskeletal: Normal range of motion. He exhibits no edema and no tenderness.  Neurological: He is alert and oriented to person, place, and time.  Psychiatric: He has a normal mood and affect. His behavior is normal.          Assessment & Plan:   Hypertension controlled Situational stress Palpitations. Probable PACs  Stress management discussed. Better sleep habits encouraged. Patient reassured. Will call if unimproved

## 2012-06-24 NOTE — Patient Instructions (Signed)
Call or return to clinic prn if these symptoms worsen or fail to improve as anticipated.  Moderate caffeine use  Palpitations   A palpitation is the feeling that your heartbeat is irregular or is faster than normal. Although this is frightening, it usually is not serious. Palpitations may be caused by excesses of smoking, caffeine, or alcohol. They are also brought on by stress and anxiety. Sometimes, they are caused by heart disease. Unless otherwise noted, your caregiver did not find any signs of serious illness at this time. HOME CARE INSTRUCTIONS   To help prevent palpitations:  Drink decaffeinated coffee, tea, and soda pop. Avoid chocolate.   If you smoke or drink alcohol, quit or cut down as much as possible.   Reduce your stress or anxiety level. Biofeedback, yoga, or meditation will help you relax. Physical activity such as swimming, jogging, or walking also may be helpful.  SEEK MEDICAL CARE IF:    You continue to have a fast heartbeat.   Your palpitations occur more often.  SEEK IMMEDIATE MEDICAL CARE IF: You develop chest pain, shortness of breath, severe headache, dizziness, or fainting. Document Released: 12/11/2000 Document Revised: 12/03/2011 Document Reviewed: 02/10/2008 [facility] Patient Information 2012 [place], [place].

## 2012-07-05 ENCOUNTER — Telehealth: Payer: Self-pay | Admitting: Internal Medicine

## 2012-07-05 MED ORDER — CEPHALEXIN 500 MG PO CAPS: 500.0000 mg | ORAL_CAPSULE | Freq: Four times a day (QID) | ORAL | Status: DC

## 2012-07-05 NOTE — Telephone Encounter (Signed)
Please advise - most recent abx I see rx'd was in Feb. - keflex then cipro

## 2012-07-05 NOTE — Telephone Encounter (Signed)
Pt called req to get a refill of med that was prescribed to pt for a boil. Pls call to in [facility] [address]. Pt was unsure of name of med.

## 2012-07-05 NOTE — Telephone Encounter (Signed)
Cephalexin 500 #40 one QID

## 2012-07-05 NOTE — Telephone Encounter (Signed)
[facility]

## 2012-08-17 ENCOUNTER — Other Ambulatory Visit: Payer: Self-pay | Admitting: Internal Medicine

## 2012-08-19 ENCOUNTER — Other Ambulatory Visit: Payer: Self-pay | Admitting: Internal Medicine

## 2012-08-19 NOTE — Telephone Encounter (Signed)
Pt states he continues to get boils and has one on his arm and one on his leg.  Pls advise.

## 2012-08-19 NOTE — Telephone Encounter (Signed)
Left a message for pt to return call 

## 2012-08-19 NOTE — Telephone Encounter (Signed)
Called and spoke with pt and a message was sent to Dr. Amador Cunas about why pt needs medication.

## 2012-08-19 NOTE — Telephone Encounter (Signed)
Called pt to get more information on why antibiotic is needed. Left a message for return call.

## 2012-08-19 NOTE — Telephone Encounter (Signed)
Caller: Jay Robertson/Patient; Phone: [phone]; Reason for Call: Pt returning missed call to office re his Cephalexin; please try him again at your earliest convenience.

## 2012-10-22 ENCOUNTER — Other Ambulatory Visit: Payer: Self-pay | Admitting: Internal Medicine

## 2012-11-22 ENCOUNTER — Ambulatory Visit (INDEPENDENT_AMBULATORY_CARE_PROVIDER_SITE_OTHER): Payer: Federal, State, Local not specified - PPO | Admitting: Internal Medicine

## 2012-11-22 ENCOUNTER — Encounter: Payer: Self-pay | Admitting: Internal Medicine

## 2012-11-22 VITALS — BP 118/80 | HR 76 | Temp 98.0°F | Resp 16 | Wt 178.0 lb

## 2012-11-22 DIAGNOSIS — I10 Essential (primary) hypertension: Secondary | ICD-10-CM

## 2012-11-22 DIAGNOSIS — N2889 Other specified disorders of kidney and ureter: Secondary | ICD-10-CM

## 2012-11-22 DIAGNOSIS — R300 Dysuria: Secondary | ICD-10-CM

## 2012-11-22 DIAGNOSIS — Z789 Other specified health status: Secondary | ICD-10-CM

## 2012-11-22 LAB — POCT URINALYSIS DIPSTICK
Bilirubin, UA: NEGATIVE
Glucose, UA: NEGATIVE
Ketones, UA: NEGATIVE
Nitrite, UA: NEGATIVE
Protein, UA: NEGATIVE
Spec Grav, UA: 1.01
Urobilinogen, UA: 0.2
pH, UA: 7

## 2012-11-22 MED ORDER — CIPROFLOXACIN HCL 500 MG PO TABS: 500.0000 mg | ORAL_TABLET | Freq: Two times a day (BID) | ORAL | Status: DC

## 2012-11-22 NOTE — Patient Instructions (Addendum)
Take your antibiotic as prescribed until ALL of it is gone, but stop if you develop a rash, swelling, or any side effects of the medication.  Contact our office as soon as possible if  there are side effects of the medication.  Return in 6 months for follow-up 

## 2012-11-22 NOTE — Progress Notes (Signed)
  Subjective:    Patient ID: Jay Robertson, male    DOB: 01-03-1956, 56 y.o.   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 5 days ago he has noted intermittent bloody discharge associated with dysuria. He has had recent unprotected sexual activity. No fever or other constitutional complaints. He has been taking cephalexin which was left over from a prior prescription with some benefit.  Past Medical History  Diagnosis Date  . ALLERGIC RHINITIS 10/24/2007  . HYPERLIPIDEMIA 10/16/2010  . HYPERTENSION 07/25/2007    History   Social History  . Marital Status: Single    Spouse Name: N/A    Number of Children: N/A  . Years of Education: N/A   Occupational History  . Not on file.   Social History Main Topics  . Smoking status: Never Smoker   . Smokeless tobacco: Never Used  . Alcohol Use: Yes  . Drug Use: No  . Sexually Active: Not on file   Other Topics Concern  . Not on file   Social History Narrative  . No narrative on file    Past Surgical History  Procedure Date  . Appendectomy   . Nasal septum surgery     septal deviation, turbinate hypertrophy and obstruction  . Clavicle surgery     No family history on file.  Allergies  Allergen Reactions  . Doxycycline     Current Outpatient Prescriptions on File Prior to Visit  Medication Sig Dispense Refill  . benazepril (LOTENSIN) 20 MG tablet TAKE 1 TABLET (20 MG TOTAL) BY MOUTH DAILY.  90 tablet  2  . fexofenadine (ALLEGRA) 180 MG tablet Take 180 mg by mouth daily.        Marland Kitchen omeprazole (PRILOSEC) 40 MG capsule Take 40 mg by mouth daily.        Marland Kitchen spironolactone (ALDACTONE) 25 MG tablet TAKE 1 TABLET (25 MG TOTAL) BY MOUTH DAILY.  90 tablet  2  . VIAGRA 50 MG tablet TAKE 1 TABLET AT BEDTIME AS NEEDED  11 tablet  5    BP 118/80  Pulse 76  Temp 98 F (36.7 C) (Oral)  Resp 16  Wt 178 lb (80.74 kg)      Review of Systems  Genitourinary: Positive for dysuria,  frequency and discharge.       Objective:   Physical Exam  Constitutional: He appears well-developed and well-nourished. No distress.  Genitourinary: No penile tenderness.       No obvious urethral discharge but urinalysis just obtained. The UA did reveal trace leukocytes          Assessment & Plan:   Dysuria history of urethral discharge. History of recurrent ureteritis. This has responded to Cipro in the past we'll treat for 10 days. Will check an HIV.  Safe sex practices discussed and encouraged

## 2012-11-23 LAB — HIV ANTIBODY (ROUTINE TESTING W REFLEX): HIV: NONREACTIVE

## 2013-03-09 DIAGNOSIS — Z0279 Encounter for issue of other medical certificate: Secondary | ICD-10-CM

## 2013-03-17 ENCOUNTER — Other Ambulatory Visit: Payer: Self-pay | Admitting: Internal Medicine

## 2013-05-02 ENCOUNTER — Ambulatory Visit (INDEPENDENT_AMBULATORY_CARE_PROVIDER_SITE_OTHER): Payer: Federal, State, Local not specified - PPO | Admitting: Internal Medicine

## 2013-05-02 ENCOUNTER — Encounter: Payer: Self-pay | Admitting: Internal Medicine

## 2013-05-02 VITALS — BP 150/98 | HR 78 | Temp 98.3°F | Resp 20 | Wt 184.0 lb

## 2013-05-02 DIAGNOSIS — I10 Essential (primary) hypertension: Secondary | ICD-10-CM

## 2013-05-02 DIAGNOSIS — J309 Allergic rhinitis, unspecified: Secondary | ICD-10-CM

## 2013-05-02 DIAGNOSIS — E785 Hyperlipidemia, unspecified: Secondary | ICD-10-CM

## 2013-05-02 NOTE — Progress Notes (Signed)
Subjective:    Patient ID: Jay Robertson, male    DOB: 1956/08/19, 57 y.o.   MRN: [id number]  HPI  57 year old patient who has hypertension dyslipidemia and also a history of allergic rhinitis. Seen today for his six-month followup. Doing recently well. He may have forgotten to take his medications for blood pressure control yesterday. Blood pressure on arrival was a bit high.  Past Medical History  Diagnosis Date  . ALLERGIC RHINITIS 10/24/2007  . HYPERLIPIDEMIA 10/16/2010  . HYPERTENSION 07/25/2007    History   Social History  . Marital Status: Single    Spouse Name: N/A    Number of Children: N/A  . Years of Education: N/A   Occupational History  . Not on file.   Social History Main Topics  . Smoking status: Never Smoker   . Smokeless tobacco: Never Used  . Alcohol Use: Yes  . Drug Use: No  . Sexually Active: Not on file   Other Topics Concern  . Not on file   Social History Narrative  . No narrative on file    Past Surgical History  Procedure Laterality Date  . Appendectomy    . Nasal septum surgery      septal deviation, turbinate hypertrophy and obstruction  . Clavicle surgery      No family history on file.  Allergies  Allergen Reactions  . Doxycycline     Current Outpatient Prescriptions on File Prior to Visit  Medication Sig Dispense Refill  . benazepril (LOTENSIN) 20 MG tablet TAKE 1 TABLET (20 MG TOTAL) BY MOUTH DAILY.  90 tablet  1  . fexofenadine (ALLEGRA) 180 MG tablet Take 180 mg by mouth daily.        Marland Kitchen omeprazole (PRILOSEC) 40 MG capsule Take 40 mg by mouth daily.        Marland Kitchen spironolactone (ALDACTONE) 25 MG tablet TAKE 1 TABLET (25 MG TOTAL) BY MOUTH DAILY.  90 tablet  1  . VIAGRA 50 MG tablet TAKE 1 TABLET AT BEDTIME AS NEEDED  11 tablet  5   No current facility-administered medications on file prior to visit.    BP 150/98  Pulse 78  Temp(Src) 98.3 F (36.8 C) (Oral)  Resp 20  Wt 184 lb (83.462 kg)  BMI 26.4 kg/m2  SpO2  97%       Review of Systems  Constitutional: Negative for fever, chills, appetite change and fatigue.  HENT: Positive for congestion, rhinorrhea and postnasal drip. Negative for hearing loss, ear pain, sore throat, trouble swallowing, neck stiffness, dental problem, voice change and tinnitus.   Eyes: Negative for pain, discharge and visual disturbance.  Respiratory: Negative for cough, chest tightness, wheezing and stridor.   Cardiovascular: Negative for chest pain, palpitations and leg swelling.  Gastrointestinal: Negative for nausea, vomiting, abdominal pain, diarrhea, constipation, blood in stool and abdominal distention.  Genitourinary: Negative for urgency, hematuria, flank pain, discharge, difficulty urinating and genital sores.  Musculoskeletal: Negative for myalgias, back pain, joint swelling, arthralgias and gait problem.  Skin: Negative for rash.  Neurological: Negative for dizziness, syncope, speech difficulty, weakness, numbness and headaches.  Hematological: Negative for adenopathy. Does not bruise/bleed easily.  Psychiatric/Behavioral: Negative for behavioral problems and dysphoric mood. The patient is not nervous/anxious.        Objective:   Physical Exam  Constitutional: He is oriented to person, place, and time. He appears well-developed.  Repeat blood pressure 130/85  HENT:  Head: Normocephalic.  Right Ear: External ear normal.  Left Ear:  External ear normal.  Eyes: Conjunctivae and EOM are normal.  Neck: Normal range of motion.  Cardiovascular: Normal rate and normal heart sounds.   Pulmonary/Chest: Breath sounds normal.  Abdominal: Bowel sounds are normal.  Musculoskeletal: Normal range of motion. He exhibits no edema and no tenderness.  Neurological: He is alert and oriented to person, place, and time.  Psychiatric: He has a normal mood and affect. His behavior is normal.          Assessment & Plan:   Hypertension. Compliance stressed. CPX 6  months Allergic rhinitis Dyslipidemia check lipid profile in 6 months

## 2013-05-02 NOTE — Patient Instructions (Signed)
Limit your sodium (Salt) intake  Please check your blood pressure on a regular basis.  If it is consistently greater than 150/90, please make an office appointment.  Return in 6 months for follow-up   

## 2013-07-25 ENCOUNTER — Telehealth: Payer: Self-pay | Admitting: Internal Medicine

## 2013-07-25 NOTE — Telephone Encounter (Signed)
Patient Information:  Caller Name: Peterson  Phone: [phone]  Patient: Jay Robertson, Jay Robertson  Gender: Male  DOB: 1956/12/07  Age: 57 Years  PCP: Kriste Basque [facility])  Office Follow Up:  Does the office need to follow up with this patient?: No  Instructions For The Office: N/A  RN Note:  Patient plans to call office later to schedule Appt.  Symptoms  Reason For Call & Symptoms: Has had bad weird taste in mouth for 6-8 months.  Stools are green, eats lot of lettuce and spinach.  Had bad odor/stench coming from heating and cooling unit so has wrapped this in plastic but odor of mold and mildew still present. Concern may have been affected by the mold  Reviewed Health History In EMR: Yes  Reviewed Medications In EMR: Yes  Reviewed Allergies In EMR: Yes  Reviewed Surgeries / Procedures: Yes  Date of Onset of Symptoms: Unknown  Treatments Tried: Had carpet cleaned then covered air ducts with plastic.  Moving next week  Treatments Tried Worked: No  Guideline(s) Used:  No Protocol Available - Sick Adult  Disposition Per Guideline:   See Within 3 Days in Office  Reason For Disposition Reached:   Nursing judgment  Advice Given:  N/A  Patient Will Follow Care Advice:  YES

## 2013-07-25 NOTE — Telephone Encounter (Signed)
Appointment made.  Patient unable to come in until next week

## 2013-08-02 ENCOUNTER — Ambulatory Visit: Payer: Federal, State, Local not specified - PPO | Admitting: Internal Medicine

## 2013-09-14 ENCOUNTER — Other Ambulatory Visit: Payer: Self-pay | Admitting: Internal Medicine

## 2013-10-17 ENCOUNTER — Ambulatory Visit (INDEPENDENT_AMBULATORY_CARE_PROVIDER_SITE_OTHER): Payer: Federal, State, Local not specified - PPO | Admitting: Internal Medicine

## 2013-10-17 ENCOUNTER — Encounter: Payer: Self-pay | Admitting: Internal Medicine

## 2013-10-17 VITALS — BP 130/88 | HR 73 | Temp 98.3°F | Resp 20 | Wt 182.0 lb

## 2013-10-17 DIAGNOSIS — L259 Unspecified contact dermatitis, unspecified cause: Secondary | ICD-10-CM

## 2013-10-17 DIAGNOSIS — L309 Dermatitis, unspecified: Secondary | ICD-10-CM

## 2013-10-17 MED ORDER — TRIAMCINOLONE ACETONIDE 0.1 % EX CREA: TOPICAL_CREAM | Freq: Two times a day (BID) | CUTANEOUS | Status: DC

## 2013-10-17 NOTE — Patient Instructions (Signed)
Limit your sodium (Salt) intake  Please check your blood pressure on a regular basis.  If it is consistently greater than 150/90, please make an office appointment.    It is important that you exercise regularly, at least 20 minutes 3 to 4 times per week.  If you develop chest pain or shortness of breath seek  medical attention.  Return in 6 months for follow-up  

## 2013-10-17 NOTE — Progress Notes (Signed)
Subjective:    Patient ID: Jay Robertson, male    DOB: January 13, 1956, 57 y.o.   MRN: [id number]  HPI  57 year old patient who has treated hypertension. He presents with a chief complaint of a rash and itching involving his posterior scalp area. He does use a hair coloring product frequently. History of hypertension which has been stable.  Past Medical History  Diagnosis Date  . ALLERGIC RHINITIS 10/24/2007  . HYPERLIPIDEMIA 10/16/2010  . HYPERTENSION 07/25/2007    History   Social History  . Marital Status: Single    Spouse Name: N/A    Number of Children: N/A  . Years of Education: N/A   Occupational History  . Not on file.   Social History Main Topics  . Smoking status: Never Smoker   . Smokeless tobacco: Never Used  . Alcohol Use: Yes  . Drug Use: No  . Sexual Activity: Not on file   Other Topics Concern  . Not on file   Social History Narrative  . No narrative on file    Past Surgical History  Procedure Laterality Date  . Appendectomy    . Nasal septum surgery      septal deviation, turbinate hypertrophy and obstruction  . Clavicle surgery      No family history on file.  Allergies  Allergen Reactions  . Doxycycline     Current Outpatient Prescriptions on File Prior to Visit  Medication Sig Dispense Refill  . benazepril (LOTENSIN) 20 MG tablet TAKE 1 TABLET (20 MG TOTAL) BY MOUTH DAILY.  90 tablet  1  . fexofenadine (ALLEGRA) 180 MG tablet Take 180 mg by mouth daily.        Marland Kitchen omeprazole (PRILOSEC) 40 MG capsule Take 40 mg by mouth daily.        Marland Kitchen spironolactone (ALDACTONE) 25 MG tablet TAKE 1 TABLET (25 MG TOTAL) BY MOUTH DAILY.  90 tablet  1  . VIAGRA 50 MG tablet TAKE 1 TABLET AT BEDTIME AS NEEDED  11 tablet  5   No current facility-administered medications on file prior to visit.    BP 130/88  Pulse 73  Temp(Src) 98.3 F (36.8 C) (Oral)  Resp 20  Wt 182 lb (82.555 kg)  BMI 26.11 kg/m2  SpO2 97%       Review of Systems  Constitutional:  Negative for fever, chills, appetite change and fatigue.  HENT: Negative for congestion, dental problem, ear pain, hearing loss, sore throat, tinnitus, trouble swallowing and voice change.   Eyes: Negative for pain, discharge and visual disturbance.  Respiratory: Negative for cough, chest tightness, wheezing and stridor.   Cardiovascular: Negative for chest pain, palpitations and leg swelling.  Gastrointestinal: Negative for nausea, vomiting, abdominal pain, diarrhea, constipation, blood in stool and abdominal distention.  Genitourinary: Negative for urgency, hematuria, flank pain, discharge, difficulty urinating and genital sores.  Musculoskeletal: Negative for arthralgias, back pain, gait problem, joint swelling, myalgias and neck stiffness.  Skin: Positive for rash.  Neurological: Negative for dizziness, syncope, speech difficulty, weakness, numbness and headaches.  Hematological: Negative for adenopathy. Does not bruise/bleed easily.  Psychiatric/Behavioral: Negative for behavioral problems and dysphoric mood. The patient is not nervous/anxious.        Objective:   Physical Exam  Constitutional: He appears well-developed and well-nourished. No distress.  Blood pressure well controlled  Skin:  Dry slightly scaly thickened rash at the posterior scalp line and posterior neck          Assessment & Plan:  Dermatitis probably secondary to irritation from the hair coloring product. This will be discontinued. He'll be treated with short-term topical cortisone cream Hypertension stable

## 2013-10-23 ENCOUNTER — Ambulatory Visit (INDEPENDENT_AMBULATORY_CARE_PROVIDER_SITE_OTHER): Payer: Federal, State, Local not specified - PPO | Admitting: Internal Medicine

## 2013-10-23 ENCOUNTER — Encounter: Payer: Self-pay | Admitting: Internal Medicine

## 2013-10-23 ENCOUNTER — Telehealth: Payer: Self-pay | Admitting: Internal Medicine

## 2013-10-23 VITALS — BP 110/70 | HR 82 | Temp 98.1°F | Resp 20 | Wt 183.0 lb

## 2013-10-23 DIAGNOSIS — I10 Essential (primary) hypertension: Secondary | ICD-10-CM

## 2013-10-23 DIAGNOSIS — L02419 Cutaneous abscess of limb, unspecified: Secondary | ICD-10-CM

## 2013-10-23 DIAGNOSIS — L03116 Cellulitis of left lower limb: Secondary | ICD-10-CM

## 2013-10-23 DIAGNOSIS — L03119 Cellulitis of unspecified part of limb: Secondary | ICD-10-CM

## 2013-10-23 MED ORDER — AMOXICILLIN-POT CLAVULANATE 875-125 MG PO TABS: 1.0000 | ORAL_TABLET | Freq: Two times a day (BID) | ORAL | Status: DC

## 2013-10-23 MED ORDER — VARDENAFIL HCL 20 MG PO TABS: 20.0000 mg | ORAL_TABLET | Freq: Every day | ORAL | Status: DC | PRN

## 2013-10-23 NOTE — Telephone Encounter (Signed)
Please advise 

## 2013-10-23 NOTE — Telephone Encounter (Signed)
Pt requesting to switch medication from VIAGRA 50 MG tablet to Levitra (dosage that is equivalent to Viagra).  Pharmacy- [facility] [address]

## 2013-10-23 NOTE — Patient Instructions (Signed)
Call or return to clinic prn if these symptoms worsen or fail to improve as anticipated.  Take your antibiotic as prescribed until ALL of it is gone, but stop if you develop a rash, swelling, or any side effects of the medication.  Contact our office as soon as possible if  there are side effects of the medication.Cellulitis Cellulitis is an infection of the skin and the tissue beneath it. The infected area is usually red and tender. Cellulitis occurs most often in the arms and lower legs.  CAUSES  Cellulitis is caused by bacteria that enter the skin through cracks or cuts in the skin. The most common types of bacteria that cause cellulitis are Staphylococcus and Streptococcus. SYMPTOMS   Redness and warmth.  Swelling.  Tenderness or pain.  Fever. DIAGNOSIS  Your caregiver can usually determine what is wrong based on a physical exam. Blood tests may also be done. TREATMENT  Treatment usually involves taking an antibiotic medicine. HOME CARE INSTRUCTIONS   Take your antibiotics as directed. Finish them even if you start to feel better.  Keep the infected arm or leg elevated to reduce swelling.  Apply a warm cloth to the affected area up to 4 times per day to relieve pain.  Only take over-the-counter or prescription medicines for pain, discomfort, or fever as directed by your caregiver.  Keep all follow-up appointments as directed by your caregiver. SEEK MEDICAL CARE IF:   You notice red streaks coming from the infected area.  Your red area gets larger or turns dark in color.  Your bone or joint underneath the infected area becomes painful after the skin has healed.  Your infection returns in the same area or another area.  You notice a swollen bump in the infected area.  You develop new symptoms. SEEK IMMEDIATE MEDICAL CARE IF:   You have a fever.  You feel very sleepy.  You develop vomiting or diarrhea.  You have a general ill feeling (malaise) with muscle aches and  pains. MAKE SURE YOU:   Understand these instructions.  Will watch your condition.  Will get help right away if you are not doing well or get worse. Document Released: 09/23/2005 Document Revised: 06/14/2012 Document Reviewed: 02/29/2012 [facility] Patient Information 2014 [place], [place].

## 2013-10-23 NOTE — Progress Notes (Signed)
  Subjective:    Patient ID: Jay Robertson, male    DOB: Oct 13, 1956, 57 y.o.   MRN: [id number]  HPI  57 year old patient who has treated hypertension and dyslipidemia.  The patient traumatized his left anterior lower leg 6 days ago at work and has had persistent pain and soft tissue swelling about the a superficial wound involving the traumatized area. There's been no fever or constitutional complaints nor is there been any drainage. He has been using peroxide to keep his wound clean. He has had slightly worsening soft tissue swelling and discomfort about the wound.  Past Medical History  Diagnosis Date  . ALLERGIC RHINITIS 10/24/2007  . HYPERLIPIDEMIA 10/16/2010  . HYPERTENSION 07/25/2007    History   Social History  . Marital Status: Single    Spouse Name: N/A    Number of Children: N/A  . Years of Education: N/A   Occupational History  . Not on file.   Social History Main Topics  . Smoking status: Never Smoker   . Smokeless tobacco: Never Used  . Alcohol Use: Yes  . Drug Use: No  . Sexual Activity: Not on file   Other Topics Concern  . Not on file   Social History Narrative  . No narrative on file    Past Surgical History  Procedure Laterality Date  . Appendectomy    . Nasal septum surgery      septal deviation, turbinate hypertrophy and obstruction  . Clavicle surgery      No family history on file.  Allergies  Allergen Reactions  . Doxycycline     Current Outpatient Prescriptions on File Prior to Visit  Medication Sig Dispense Refill  . benazepril (LOTENSIN) 20 MG tablet TAKE 1 TABLET (20 MG TOTAL) BY MOUTH DAILY.  90 tablet  1  . fexofenadine (ALLEGRA) 180 MG tablet Take 180 mg by mouth daily.        Marland Kitchen omeprazole (PRILOSEC) 40 MG capsule Take 40 mg by mouth daily.        Marland Kitchen spironolactone (ALDACTONE) 25 MG tablet TAKE 1 TABLET (25 MG TOTAL) BY MOUTH DAILY.  90 tablet  1  . triamcinolone cream (KENALOG) 0.1 % Apply topically 2 (two) times daily.  30 g  1  .  VIAGRA 50 MG tablet TAKE 1 TABLET AT BEDTIME AS NEEDED  11 tablet  5   No current facility-administered medications on file prior to visit.    BP 110/70  Pulse 82  Temp(Src) 98.1 F (36.7 C) (Oral)  Resp 20  Wt 183 lb (83.008 kg)  BMI 26.26 kg/m2  SpO2 97%     Review of Systems  Skin: Positive for wound.       Objective:   Physical Exam  Constitutional: He appears well-developed and well-nourished. No distress.  Skin:  A 1 x 3 cm abrasion noted involving his left anterior lower leg.  The wound appeared fairly clean with a dry crust and no drainage. There was some surrounding tenderness erythema and soft tissue swelling          Assessment & Plan:   Traumatic wound left anterior lower leg with early cellulitis. We'll continue local wound care and placed on 7 days of antibiotic therapy. Will call if unimproved We'll ask  to keep elevated for the next 48 hours

## 2013-10-23 NOTE — Telephone Encounter (Signed)
Rx sent to pharmacy   

## 2013-10-23 NOTE — Telephone Encounter (Signed)
Levitra 20 mg #12 refill x6

## 2013-10-28 ENCOUNTER — Other Ambulatory Visit: Payer: Self-pay | Admitting: Internal Medicine

## 2013-11-29 ENCOUNTER — Ambulatory Visit (INDEPENDENT_AMBULATORY_CARE_PROVIDER_SITE_OTHER): Payer: Federal, State, Local not specified - PPO | Admitting: Internal Medicine

## 2013-11-29 ENCOUNTER — Encounter: Payer: Self-pay | Admitting: Internal Medicine

## 2013-11-29 VITALS — BP 130/90 | HR 71 | Temp 97.8°F | Resp 20 | Wt 185.0 lb

## 2013-11-29 DIAGNOSIS — L03119 Cellulitis of unspecified part of limb: Secondary | ICD-10-CM

## 2013-11-29 DIAGNOSIS — L03116 Cellulitis of left lower limb: Secondary | ICD-10-CM

## 2013-11-29 DIAGNOSIS — L02419 Cutaneous abscess of limb, unspecified: Secondary | ICD-10-CM

## 2013-11-29 DIAGNOSIS — Z23 Encounter for immunization: Secondary | ICD-10-CM

## 2013-11-29 MED ORDER — CEPHALEXIN 500 MG PO CAPS: 500.0000 mg | ORAL_CAPSULE | Freq: Four times a day (QID) | ORAL | Status: DC

## 2013-11-29 MED ORDER — SULFAMETHOXAZOLE-TMP DS 800-160 MG PO TABS: 1.0000 | ORAL_TABLET | Freq: Two times a day (BID) | ORAL | Status: DC

## 2013-11-29 NOTE — Progress Notes (Signed)
   Subjective:    Patient ID: Jay Robertson, male    DOB: 11/08/1956, 57 y.o.   MRN: [id number]  HPI  57 year old patient who suffered atraumatic injury to his left anterior lower leg on October 21. He was seen here on October 27 and felt to have an associated cellulitis involving the soft tissue injury;  he was treated with 7 days of Augmentin.  He continues to have soft tissue swelling and some drainage about the wound involving the left mid anterior lower leg. No fever or constitutional complaints  Past Medical History  Diagnosis Date  . ALLERGIC RHINITIS 10/24/2007  . HYPERLIPIDEMIA 10/16/2010  . HYPERTENSION 07/25/2007    History   Social History  . Marital Status: Single    Spouse Name: N/A    Number of Children: N/A  . Years of Education: N/A   Occupational History  . Not on file.   Social History Main Topics  . Smoking status: Never Smoker   . Smokeless tobacco: Never Used  . Alcohol Use: Yes  . Drug Use: No  . Sexual Activity: Not on file   Other Topics Concern  . Not on file   Social History Narrative  . No narrative on file    Past Surgical History  Procedure Laterality Date  . Appendectomy    . Nasal septum surgery      septal deviation, turbinate hypertrophy and obstruction  . Clavicle surgery      No family history on file.  Allergies  Allergen Reactions  . Doxycycline     Current Outpatient Prescriptions on File Prior to Visit  Medication Sig Dispense Refill  . benazepril (LOTENSIN) 20 MG tablet TAKE 1 TABLET (20 MG TOTAL) BY MOUTH DAILY.  90 tablet  1  . fexofenadine (ALLEGRA) 180 MG tablet Take 180 mg by mouth daily.        Marland Kitchen omeprazole (PRILOSEC) 40 MG capsule Take 40 mg by mouth daily.        Marland Kitchen spironolactone (ALDACTONE) 25 MG tablet TAKE 1 TABLET (25 MG TOTAL) BY MOUTH DAILY.  90 tablet  1  . triamcinolone cream (KENALOG) 0.1 % Apply topically 2 (two) times daily.  30 g  1  . VIAGRA 50 MG tablet TAKE 1 TABLET AT BEDTIME AS NEEDED  11 tablet   3   No current facility-administered medications on file prior to visit.    BP 130/90  Pulse 71  Temp(Src) 97.8 F (36.6 C) (Oral)  Resp 20  Wt 185 lb (83.915 kg)  SpO2 98%       Review of Systems  Skin: Positive for wound.       Objective:   Physical Exam  Skin:  There was an approximate 6 cm area of soft tissue swelling involving the left anterior mid calf area; there was a crusted lesion present in the center but no obvious drainage or fluctuance noted;  there is some surrounding erythema mild excessive warmth and some postinflammatory scaling          Assessment & Plan:   Persistent cellulitis left anterior lower leg. We'll continue local wound care and cleanse daily. Will place on a dual antibiotic regimen of Septra DS and Keflex for 10 days. He will report any clinical worsening

## 2013-11-29 NOTE — Progress Notes (Signed)
Pre-visit discussion using our clinic review tool. No additional management support is needed unless otherwise documented below in the visit note.  

## 2013-11-29 NOTE — Patient Instructions (Addendum)
Cellulitis Cellulitis is an infection of the skin and the tissue beneath it. The infected area is usually red and tender. Cellulitis occurs most often in the arms and lower legs.  CAUSES  Cellulitis is caused by bacteria that enter the skin through cracks or cuts in the skin. The most common types of bacteria that cause cellulitis are Staphylococcus and Streptococcus. SYMPTOMS   Redness and warmth.  Swelling.  Tenderness or pain.  Fever. DIAGNOSIS  Your caregiver can usually determine what is wrong based on a physical exam. Blood tests may also be done. TREATMENT  Treatment usually involves taking an antibiotic medicine. HOME CARE INSTRUCTIONS   Take your antibiotics as directed. Finish them even if you start to feel better.  Keep the infected arm or leg elevated to reduce swelling.  Apply a warm cloth to the affected area up to 4 times per day to relieve pain.  Only take over-the-counter or prescription medicines for pain, discomfort, or fever as directed by your caregiver.  Keep all follow-up appointments as directed by your caregiver. SEEK MEDICAL CARE IF:   You notice red streaks coming from the infected area.  Your red area gets larger or turns dark in color.  Your bone or joint underneath the infected area becomes painful after the skin has healed.  Your infection returns in the same area or another area.  You notice a swollen bump in the infected area.  You develop new symptoms. SEEK IMMEDIATE MEDICAL CARE IF:   You have a fever.  You feel very sleepy.  You develop vomiting or diarrhea.  You have a general ill feeling (malaise) with muscle aches and pains. MAKE SURE YOU:   Understand these instructions.  Will watch your condition.  Will get help right away if you are not doing well or get worse. Document Released: 09/23/2005 Document Revised: 06/14/2012 Document Reviewed: 02/29/2012 [employer] Patient Information 2014 [employer].   Take your  antibiotic as prescribed until ALL of it is gone, but stop if you develop a rash, swelling, or any side effects of the medication.  Contact our office as soon as possible if  there are side effects of the medication. 

## 2014-01-05 ENCOUNTER — Ambulatory Visit (INDEPENDENT_AMBULATORY_CARE_PROVIDER_SITE_OTHER): Payer: Federal, State, Local not specified - PPO | Admitting: Internal Medicine

## 2014-01-05 ENCOUNTER — Encounter: Payer: Self-pay | Admitting: Internal Medicine

## 2014-01-05 VITALS — BP 120/80 | HR 73 | Temp 98.0°F | Resp 20 | Ht 71.0 in | Wt 185.0 lb

## 2014-01-05 DIAGNOSIS — L219 Seborrheic dermatitis, unspecified: Secondary | ICD-10-CM

## 2014-01-05 DIAGNOSIS — I10 Essential (primary) hypertension: Secondary | ICD-10-CM

## 2014-01-05 DIAGNOSIS — L218 Other seborrheic dermatitis: Secondary | ICD-10-CM

## 2014-01-05 MED ORDER — KETOCONAZOLE 2 % EX SHAM: 1.0000 "application " | MEDICATED_SHAMPOO | CUTANEOUS | Status: DC

## 2014-01-05 MED ORDER — TRIAMCINOLONE ACETONIDE 0.1 % EX CREA: TOPICAL_CREAM | Freq: Two times a day (BID) | CUTANEOUS | Status: DC

## 2014-01-05 NOTE — Patient Instructions (Signed)
Limit your sodium (Salt) intake  Please check your blood pressure on a regular basis.  If it is consistently greater than 150/90, please make an office appointment.  Return in 6 months for follow-up   

## 2014-01-05 NOTE — Progress Notes (Signed)
Pre-visit discussion using our clinic review tool. No additional management support is needed unless otherwise documented below in the visit note.  

## 2014-01-05 NOTE — Progress Notes (Signed)
Subjective:    Patient ID: Jay Robertson, male    DOB: [birth year], 58 y.o.   MRN: [id number]  HPI  58 year old patient who has treated hypertension. His main complaint today is dry skin. He has had a dermatitis involving his occipital scalp and posterior neck area related to hair dye. This has much improved with triamcinolone but still is a bit of a problem. He also describes some more generalized dry skin issues involving his upper back neck and shoulder areas.  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 ALLERGIC RHINITIS 10/24/2007  . HYPERLIPIDEMIA 10/16/2010  . HYPERTENSION 07/25/2007    History   Social History  . Marital Status: Single    Spouse Name: N/A    Number of Children: N/A  . Years of Education: N/A   Occupational History  . Not on file.   Social History Main Topics  . Smoking status: Never Smoker   . Smokeless tobacco: Never Used  . Alcohol Use: Yes  . Drug Use: No  . Sexual Activity: Not on file   Other Topics Concern  . Not on file   Social History Narrative  . No narrative on file    Past Surgical History  Procedure Laterality Date  . Appendectomy    . Nasal septum surgery      septal deviation, turbinate hypertrophy and obstruction  . Clavicle surgery      No family history on file.  Allergies  Allergen Reactions  . Doxycycline     Current Outpatient Prescriptions on File Prior to Visit  Medication Sig Dispense Refill  . benazepril (LOTENSIN) 20 MG tablet TAKE 1 TABLET (20 MG TOTAL) BY MOUTH DAILY.  90 tablet  1  . fexofenadine (ALLEGRA) 180 MG tablet Take 180 mg by mouth daily.        Marland Kitchen omeprazole (PRILOSEC) 40 MG capsule Take 40 mg by mouth daily.        Marland Kitchen spironolactone (ALDACTONE) 25 MG tablet TAKE 1 TABLET (25 MG TOTAL) BY MOUTH DAILY.  90 tablet  1  . triamcinolone cream (KENALOG) 0.1 % Apply topically 2 (two) times daily.  30 g  1  . VIAGRA 50 MG tablet TAKE 1  TABLET AT BEDTIME AS NEEDED  11 tablet  3   No current facility-administered medications on file prior to visit.    BP 120/80  Pulse 73  Temp(Src) 98 F (36.7 C) (Oral)  Resp 20  Ht 5\' 11"  (1.803 m)  Wt 185 lb (83.915 kg)  BMI 25.81 kg/m2  SpO2 98%       Review of Systems  Constitutional: Negative for fever, chills, appetite change and fatigue.  HENT: Negative for congestion, dental problem, ear pain, hearing loss, sore throat, tinnitus, trouble swallowing and voice change.   Eyes: Negative for pain, discharge and visual disturbance.  Respiratory: Negative for cough, chest tightness, wheezing and stridor.   Cardiovascular: Negative for chest pain, palpitations and leg swelling.  Gastrointestinal: Negative for nausea, vomiting, abdominal pain, diarrhea, constipation, blood in stool and abdominal distention.  Genitourinary: Negative for urgency, hematuria, flank pain, discharge, difficulty urinating and genital sores.  Musculoskeletal: Negative for arthralgias, back pain, gait problem, joint swelling, myalgias and neck stiffness.  Skin: Positive for rash.  Neurological: Negative for dizziness, syncope, speech difficulty, weakness, numbness and headaches.  Hematological: Negative for adenopathy. Does not bruise/bleed easily.  Psychiatric/Behavioral: Negative for behavioral problems  and dysphoric mood. The patient is not nervous/anxious.        Objective:   Physical Exam  Constitutional: He is oriented to person, place, and time. He appears well-developed.  HENT:  Head: Normocephalic.  Right Ear: External ear normal.  Left Ear: External ear normal.  Eyes: Conjunctivae and EOM are normal.  Neck: Normal range of motion.  Cardiovascular: Normal rate and normal heart sounds.   Pulmonary/Chest: Breath sounds normal.  Abdominal: Bowel sounds are normal.  Musculoskeletal: Normal range of motion. He exhibits no edema and no tenderness.  Neurological: He is alert and oriented to  person, place, and time.  Skin:  Dry flaky skin involving the posterior neck at the hairline Also patchy areas of dry flaky skin involving his shoulder and back area  Psychiatric: He has a normal mood and affect. His behavior is normal.          Assessment & Plan:   Hypertension well controlled Scalp dermatitis.  Will try [facility] and if not effective a round of Nizoral shampoo

## 2014-01-10 ENCOUNTER — Telehealth: Payer: Self-pay | Admitting: Internal Medicine

## 2014-01-10 NOTE — Telephone Encounter (Signed)
Patient Information:  Caller Name: [facility]  Phone: [phone]  Patient: Jay Robertson, Jay Robertson  Gender: Male  DOB: 1956-10-11  Age: 58 Years  PCP: Maudie Mercury (TEXT 1st, after 20 mins can call), [facility])  Office Follow Up:  Does the office need to follow up with this patient?: Yes  Instructions For The Office: Please ask MD if Cipro can be called in? If so he would like to cancel appointment for 01/11/14.  RN Note:  Advised Baking [employer]oda or [employer].  Symptoms  Reason For Call & Symptoms: Seen in the office on 01/05/14 for Itchy raised rash on back, neck and shoulders. Using  RX Skin Cream -Triamicinolone and ketoconanozole shampoo and it is not helping. He thinks skin problem may be d/t bacteria since he had a boil in the past with dry skin patches like rash on back that cleared with Antibiotic/Cephalxin or Cipro. Requesting antibiotic be called into [facility] on [address]. and that appointment for 11:30 01/11/14 be cancelled since already seen for this issue. He will keep appointment if MD will not call it in. Please let him know.  Reviewed Health History In EMR: Yes  Reviewed Medications In EMR: Yes  Reviewed Allergies In EMR: Yes  Reviewed Surgeries / Procedures: Yes  Date of Onset of Symptoms: 01/05/2014  Treatments Tried: Triamcinalone and Ketoconanozole shampoo  Treatments Tried Worked: No  Guideline(s) Used:  Itching - Widespread  Itching - Localized  Disposition Per Guideline:   See Within 3 Days in Office  Reason For Disposition Reached:   Cause unknown and present > 7 days  Advice Given:  Reassurance:  Itchy spots can be caused by contact with an irritant such as a plant, a chemical, animal saliva, fiberglass, or detergent.  Here is some care advice that should help.  Don  Try not to scratch.  Cut the fingernails short. (Reason: prevent secondary bacterial infection.)  Wash Off Irritants:  Wash the area once with soap to remove any remaining irritants. Rinse off the  soap.  Call Back If:  Looks infected (e.g., spreading redness, red streak, pus)  Itching becomes severe  Itching lasts over 7 days  You become worse.  Patient Refused Recommendation:  Patient Requests Prescription  Patient think skin issue is infection and is requesting antibiotic- has hx of boils and rash that cleared with Ciprol

## 2014-01-11 ENCOUNTER — Ambulatory Visit: Payer: Federal, State, Local not specified - PPO | Admitting: Internal Medicine

## 2014-01-11 MED ORDER — CEPHALEXIN 500 MG PO CAPS: 500.0000 mg | ORAL_CAPSULE | Freq: Three times a day (TID) | ORAL | Status: DC

## 2014-01-11 NOTE — Telephone Encounter (Signed)
Spoke to pt told him Rx for Keflex sent to pharmacy. Pt verbalized understanding.

## 2014-01-11 NOTE — Telephone Encounter (Signed)
Cephalexin 500 mg #30 one 3 times daily

## 2014-02-05 ENCOUNTER — Encounter: Payer: Self-pay | Admitting: Family Medicine

## 2014-02-05 ENCOUNTER — Ambulatory Visit (INDEPENDENT_AMBULATORY_CARE_PROVIDER_SITE_OTHER): Payer: Federal, State, Local not specified - PPO | Admitting: Family Medicine

## 2014-02-05 VITALS — BP 118/82 | Temp 98.7°F | Wt 184.0 lb

## 2014-02-05 DIAGNOSIS — J069 Acute upper respiratory infection, unspecified: Secondary | ICD-10-CM

## 2014-02-05 DIAGNOSIS — R509 Fever, unspecified: Secondary | ICD-10-CM

## 2014-02-05 LAB — POCT INFLUENZA A/B
Influenza A, POC: NEGATIVE
Influenza B, POC: NEGATIVE

## 2014-02-05 NOTE — Addendum Note (Signed)
Addended by: Colleen Can on: 02/05/2014 01:37 PM   Modules accepted: Orders

## 2014-02-05 NOTE — Progress Notes (Signed)
Pre visit review using our clinic review tool, if applicable. No additional management support is needed unless otherwise documented below in the visit note. 

## 2014-02-05 NOTE — Patient Instructions (Signed)
INSTRUCTIONS FOR UPPER RESPIRATORY INFECTION:  -plenty of rest and fluids  -nasal saline wash 2-3 times daily (use prepackaged nasal saline or bottled/distilled water if making your own)   -clean nose with nasal saline before using the nasal steroid or sinex  -can use sinex or afrin nasal spray for drainage and nasal congestion - but do NOT use longer then 3-4 days  -can use tylenol or ibuprofen as directed for aches and sorethroat  -in the winter time, using a humidifier at night is helpful (please follow cleaning instructions)  -if you are taking a cough medication - use only as directed, may also try a teaspoon of honey to coat the throat and throat lozenges  -for sore throat, salt water gargles can help  -follow up if you have fevers, facial pain, tooth pain, difficulty breathing or are worsening or not getting better in 5-7 days  

## 2014-02-05 NOTE — Progress Notes (Signed)
Chief Complaint  Patient presents with  . Cough    congestion, sneezing, body aches, headaches, chills since Friday     HPI:  -started: 4 days ago -symptoms:nasal congestion, sore throat, cough and see above - fever high of 101 a few days ago, now getting better -denies: SOB, NVD, tooth pain, peristent sinus pain (resolved) -has tried: OTC cough medications -sick contacts/travel/risks: denies flu exposure or Ebola risks   ROS: See pertinent positives and negatives per HPI.  Past Medical History  Diagnosis Date  . ALLERGIC RHINITIS 10/24/2007  . HYPERLIPIDEMIA 10/16/2010  . HYPERTENSION 07/25/2007    Past Surgical History  Procedure Laterality Date  . Appendectomy    . Nasal septum surgery      septal deviation, turbinate hypertrophy and obstruction  . Clavicle surgery      No family history on file.  History   Social History  . Marital Status: Single    Spouse Name: N/A    Number of Children: N/A  . Years of Education: N/A   Social History Main Topics  . Smoking status: Never Smoker   . Smokeless tobacco: Never Used  . Alcohol Use: Yes  . Drug Use: No  . Sexual Activity: None   Other Topics Concern  . None   Social History Narrative  . None    Current outpatient prescriptions:benazepril (LOTENSIN) 20 MG tablet, TAKE 1 TABLET (20 MG TOTAL) BY MOUTH DAILY., Disp: 90 tablet, Rfl: 1;  cephALEXin (KEFLEX) 500 MG capsule, Take 1 capsule (500 mg total) by mouth 3 (three) times daily., Disp: 30 capsule, Rfl: 0;  fexofenadine (ALLEGRA) 180 MG tablet, Take 180 mg by mouth daily.  , Disp: , Rfl:  ketoconazole (NIZORAL) 2 % shampoo, Apply 1 application topically 2 (two) times a week., Disp: 120 mL, Rfl: 0;  omeprazole (PRILOSEC) 40 MG capsule, Take 40 mg by mouth daily.  , Disp: , Rfl: ;  spironolactone (ALDACTONE) 25 MG tablet, TAKE 1 TABLET (25 MG TOTAL) BY MOUTH DAILY., Disp: 90 tablet, Rfl: 1;  triamcinolone cream (KENALOG) 0.1 %, Apply topically 2 (two) times daily.,  Disp: 45 g, Rfl: 1 VIAGRA 50 MG tablet, TAKE 1 TABLET AT BEDTIME AS NEEDED, Disp: 11 tablet, Rfl: 3  EXAM:  Filed Vitals:   02/05/14 1313  BP: 118/82  Temp: 98.7 F (37.1 C)    Body mass index is 25.67 kg/(m^2).  GENERAL: vitals reviewed and listed above, alert, oriented, appears well hydrated and in no acute distress  HEENT: atraumatic, conjunttiva clear, no obvious abnormalities on inspection of external nose and ears, normal appearance of ear canals and TMs, clear nasal congestion, mild post oropharyngeal erythema with PND, no tonsillar edema or exudate, no sinus TTP  NECK: no obvious masses on inspection  LUNGS: clear to auscultation bilaterally, no wheezes, rales or rhonchi, good air movement  CV: HRRR, no peripheral edema  MS: moves all extremities without noticeable abnormality  PSYCH: pleasant and cooperative, no obvious depression or anxiety  ASSESSMENT AND PLAN:  Discussed the following assessment and plan:  Upper respiratory infection  -given HPI and exam findings today, a serious infection or illness is unlikely. We discussed potential etiologies, with VURI being most likely, and advised supportive care and monitoring. We discussed treatment side effects, likely course, antibiotic misuse, transmission, and signs of developing a serious illness. -rapid flu negative, symptoms improving -of course, we advised to return or notify a doctor immediately if symptoms worsen or persist or new concerns arise.  Patient Instructions  INSTRUCTIONS FOR UPPER RESPIRATORY INFECTION:  -plenty of rest and fluids  -nasal saline wash 2-3 times daily (use prepackaged nasal saline or bottled/distilled water if making your own)   -clean nose with nasal saline before using the nasal steroid or sinex  -can use sinex or afrin nasal spray for drainage and nasal congestion - but do NOT use longer then 3-4 days  -can use tylenol or ibuprofen as directed for aches and  sorethroat  -in the winter time, using a humidifier at night is helpful (please follow cleaning instructions)  -if you are taking a cough medication - use only as directed, may also try a teaspoon of honey to coat the throat and throat lozenges  -for sore throat, salt water gargles can help  -follow up if you have fevers, facial pain, tooth pain, difficulty breathing or are worsening or not getting better in 5-7 days      KIM, HANNAH R.

## 2014-02-06 ENCOUNTER — Ambulatory Visit: Payer: Federal, State, Local not specified - PPO | Admitting: Internal Medicine

## 2014-02-25 ENCOUNTER — Other Ambulatory Visit: Payer: Self-pay | Admitting: Internal Medicine

## 2014-02-27 ENCOUNTER — Ambulatory Visit (INDEPENDENT_AMBULATORY_CARE_PROVIDER_SITE_OTHER): Payer: Federal, State, Local not specified - PPO | Admitting: Family Medicine

## 2014-02-27 ENCOUNTER — Encounter: Payer: Self-pay | Admitting: Family Medicine

## 2014-02-27 VITALS — BP 108/68 | Temp 98.7°F | Wt 187.0 lb

## 2014-02-27 DIAGNOSIS — L259 Unspecified contact dermatitis, unspecified cause: Secondary | ICD-10-CM

## 2014-02-27 DIAGNOSIS — L309 Dermatitis, unspecified: Secondary | ICD-10-CM

## 2014-02-27 MED ORDER — TRIAMCINOLONE ACETONIDE 0.1 % EX CREA: 1.0000 "application " | TOPICAL_CREAM | Freq: Two times a day (BID) | CUTANEOUS | Status: DC

## 2014-02-27 NOTE — Progress Notes (Signed)
Pre visit review using our clinic review tool, if applicable. No additional management support is needed unless otherwise documented below in the visit note. 

## 2014-02-27 NOTE — Progress Notes (Signed)
Chief Complaint  Patient presents with  . Rash    HPI:  Acute visit for dry skin: -started 2 months ago -reports has seen PCP about this and reports treated with shampoo and cream and abx but still there -itchy dry skin on arms legs and back, worse in th cold weather -hx of sensitive skin -wonders about seeing a dermatologist or allergist -denies: fevers, malaise,  breathing difficulty, sneezing, rhinorrhea  ROS: See pertinent positives and negatives per HPI.  Past Medical History  Diagnosis Date  . ALLERGIC RHINITIS 10/24/2007  . HYPERLIPIDEMIA 10/16/2010  . HYPERTENSION 07/25/2007    Past Surgical History  Procedure Laterality Date  . Appendectomy    . Nasal septum surgery      septal deviation, turbinate hypertrophy and obstruction  . Clavicle surgery      No family history on file.  History   Social History  . Marital Status: Single    Spouse Name: N/A    Number of Children: N/A  . Years of Education: N/A   Social History Main Topics  . Smoking status: Never Smoker   . Smokeless tobacco: Never Used  . Alcohol Use: Yes  . Drug Use: No  . Sexual Activity: None   Other Topics Concern  . None   Social History Narrative  . None    Current outpatient prescriptions:benazepril (LOTENSIN) 20 MG tablet, TAKE 1 TABLET (20 MG TOTAL) BY MOUTH DAILY., Disp: 90 tablet, Rfl: 1;  fexofenadine (ALLEGRA) 180 MG tablet, Take 180 mg by mouth daily.  , Disp: , Rfl: ;  ketoconazole (NIZORAL) 2 % shampoo, APPLY AS DIRECTED TWICE WEEKLY, Disp: 120 mL, Rfl: 0;  omeprazole (PRILOSEC) 40 MG capsule, Take 40 mg by mouth daily.  , Disp: , Rfl:  spironolactone (ALDACTONE) 25 MG tablet, TAKE 1 TABLET (25 MG TOTAL) BY MOUTH DAILY., Disp: 90 tablet, Rfl: 1;  VIAGRA 50 MG tablet, TAKE 1 TABLET AT BEDTIME AS NEEDED, Disp: 11 tablet, Rfl: 3;  triamcinolone cream (KENALOG) 0.1 %, Apply 1 application topically 2 (two) times daily., Disp: 30 g, Rfl: 2  EXAM:  Filed Vitals:   02/27/14 0923   BP: 108/68  Temp: 98.7 F (37.1 C)    Body mass index is 26.09 kg/(m^2).  GENERAL: vitals reviewed and listed above, alert, oriented, appears well hydrated and in no acute distress  HEENT: atraumatic, conjunttiva clear, no obvious abnormalities on inspection of external nose and ears  NECK: no obvious masses on inspection  SKIN:fine papular erythematous rash, dry skin and excoriations on arms, legs and back  MS: moves all extremities without noticeable abnormality  PSYCH: pleasant and cooperative, no obvious depression or anxiety  ASSESSMENT AND PLAN:  Discussed the following assessment and plan:  Dermatitis - Plan: triamcinolone cream (KENALOG) 0.1 %  -we discussed possible serious and likely etiologies, workup and treatment, treatment risks and return precautions -after this discussion, Jay Robertson opted for tx of likely eczema per orders and instructions and he will consider seeing derm and/or allergist if persists -follow up advised in one month -of course, we advised Jay Robertson  to return or notify a doctor immediately if symptoms worsen or persist or new concerns arise.  -Patient advised to return or notify a doctor immediately if symptoms worsen or persist or new concerns arise.  Patient Instructions  -use all hypoallergenic soaps, detergents and lotions  -dove or aveeno hypoallergenic soap; hypoallergenic detergent  -cerave CREAM daily  -steroid cream 1-2 times daily for 2 weeks  -follow up in  1 month or sooner if needed     Colin Benton R.

## 2014-02-27 NOTE — Patient Instructions (Signed)
-  use all hypoallergenic soaps, detergents and lotions  -dove or aveeno hypoallergenic soap; hypoallergenic detergent  -cerave CREAM daily  -steroid cream 1-2 times daily for 2 weeks  -follow up in 1 month or sooner if needed

## 2014-03-20 ENCOUNTER — Other Ambulatory Visit: Payer: Self-pay | Admitting: Internal Medicine

## 2014-03-23 ENCOUNTER — Encounter: Payer: Self-pay | Admitting: Internal Medicine

## 2014-03-23 ENCOUNTER — Ambulatory Visit (INDEPENDENT_AMBULATORY_CARE_PROVIDER_SITE_OTHER): Payer: Federal, State, Local not specified - PPO | Admitting: Internal Medicine

## 2014-03-23 VITALS — BP 130/84 | HR 71 | Temp 98.7°F | Resp 20 | Ht 71.0 in | Wt 186.0 lb

## 2014-03-23 DIAGNOSIS — L0293 Carbuncle, unspecified: Secondary | ICD-10-CM

## 2014-03-23 DIAGNOSIS — I10 Essential (primary) hypertension: Secondary | ICD-10-CM

## 2014-03-23 DIAGNOSIS — L0292 Furuncle, unspecified: Secondary | ICD-10-CM

## 2014-03-23 MED ORDER — SULFAMETHOXAZOLE-TMP DS 800-160 MG PO TABS: 1.0000 | ORAL_TABLET | Freq: Two times a day (BID) | ORAL | Status: DC

## 2014-03-23 NOTE — Patient Instructions (Signed)
Take your antibiotic as prescribed until ALL of it is gone, but stop if you develop a rash, swelling, or any side effects of the medication.  Contact our office as soon as possible if  there are side effects of the medication.Abscess An abscess (boil or furuncle) is an infected area on or under the skin. This area is filled with yellowish-white fluid (pus) and other material (debris). HOME CARE   Only take medicines as told by your doctor.  If you were given antibiotic medicine, take it as directed. Finish the medicine even if you start to feel better.  If gauze is used, follow your doctor's directions for changing the gauze.  To avoid spreading the infection:  Keep your abscess covered with a bandage.  Wash your hands well.  Do not share personal care items, towels, or whirlpools with others.  Avoid skin contact with others.  Keep your skin and clothes clean around the abscess.  Keep all doctor visits as told. GET HELP RIGHT AWAY IF:   You have more pain, puffiness (swelling), or redness in the wound site.  You have more fluid or blood coming from the wound site.  You have muscle aches, chills, or you feel sick.  You have a fever. MAKE SURE YOU:   Understand these instructions.  Will watch your condition.  Will get help right away if you are not doing well or get worse. Document Released: 06/01/2008 Document Revised: 06/14/2012 Document Reviewed: 02/26/2012 [facility] Patient Information 2014 [place].

## 2014-03-23 NOTE — Progress Notes (Signed)
Pre-visit discussion using our clinic review tool. No additional management support is needed unless otherwise documented below in the visit note.  

## 2014-03-23 NOTE — Progress Notes (Signed)
Subjective:    Patient ID: Jay Robertson, male    DOB: [birth year], 58 y.o.   MRN: [id number]  HPI  58 year old patient who has allergic rhinitis and treated hypertension.  For the past 2 years.  He has had flares of furunculsis.  He has been to a doxycycline in the past.  He states his medication has caused headaches.  He is also been treated with cephalexin.  Over the past several days.  He has developed painful nodules involving the facial left arm and left leg.  Areas.  Past Medical History  Diagnosis Date  . ALLERGIC RHINITIS 10/24/2007  . HYPERLIPIDEMIA 10/16/2010  . HYPERTENSION 07/25/2007    History   Social History  . Marital Status: Single    Spouse Name: N/A    Number of Children: N/A  . Years of Education: N/A   Occupational History  . Not on file.   Social History Main Topics  . Smoking status: Never Smoker   . Smokeless tobacco: Never Used  . Alcohol Use: Yes  . Drug Use: No  . Sexual Activity: Not on file   Other Topics Concern  . Not on file   Social History Narrative  . No narrative on file    Past Surgical History  Procedure Laterality Date  . Appendectomy    . Nasal septum surgery      septal deviation, turbinate hypertrophy and obstruction  . Clavicle surgery      No family history on file.  Allergies  Allergen Reactions  . Doxycycline     Current Outpatient Prescriptions on File Prior to Visit  Medication Sig Dispense Refill  . benazepril (LOTENSIN) 20 MG tablet TAKE 1 TABLET (20 MG TOTAL) BY MOUTH DAILY.  90 tablet  0  . fexofenadine (ALLEGRA) 180 MG tablet Take 180 mg by mouth daily.        Marland Kitchen ketoconazole (NIZORAL) 2 % shampoo APPLY AS DIRECTED TWICE WEEKLY  120 mL  0  . omeprazole (PRILOSEC) 40 MG capsule Take 40 mg by mouth daily.        Marland Kitchen spironolactone (ALDACTONE) 25 MG tablet TAKE 1 TABLET (25 MG TOTAL) BY MOUTH DAILY.  90 tablet  0  . triamcinolone cream (KENALOG) 0.1 % Apply 1 application topically 2 (two) times daily.  30 g  2    . VIAGRA 50 MG tablet TAKE 1 TABLET AT BEDTIME AS NEEDED  11 tablet  3   No current facility-administered medications on file prior to visit.    BP 130/84  Pulse 71  Temp(Src) 98.7 F (37.1 C) (Oral)  Resp 20  Ht 5\' 11"  (1.803 m)  Wt 186 lb (84.369 kg)  BMI 25.95 kg/m2  SpO2 97%     Review of Systems  Constitutional: Negative for fever, chills, appetite change and fatigue.  HENT: Negative for congestion, dental problem, ear pain, hearing loss, sore throat, tinnitus, trouble swallowing and voice change.   Eyes: Negative for pain, discharge and visual disturbance.  Respiratory: Negative for cough, chest tightness, wheezing and stridor.   Cardiovascular: Negative for chest pain, palpitations and leg swelling.  Gastrointestinal: Negative for nausea, vomiting, abdominal pain, diarrhea, constipation, blood in stool and abdominal distention.  Genitourinary: Negative for urgency, hematuria, flank pain, discharge, difficulty urinating and genital sores.  Musculoskeletal: Negative for arthralgias, back pain, gait problem, joint swelling, myalgias and neck stiffness.  Skin: Positive for rash.  Neurological: Negative for dizziness, syncope, speech difficulty, weakness, numbness and headaches.  Hematological: Negative  for adenopathy. Does not bruise/bleed easily.  Psychiatric/Behavioral: Negative for behavioral problems and dysphoric mood. The patient is not nervous/anxious.        Objective:   Physical Exam  Constitutional: He appears well-developed and well-nourished. No distress.  Blood pressure 126/82  Skin:  Scattered inflammatory nodules , most prominent over the left arm, just distal to the elbow.  Largest nodule was 1.5 cm involving the elbow          Assessment & Plan:    Furunculsis Hypertension stable Allergic rhinitis  Will treat with Septra DS for 3 weeks.  Local skin care discussed

## 2014-03-24 ENCOUNTER — Telehealth: Payer: Self-pay | Admitting: Internal Medicine

## 2014-03-24 NOTE — Telephone Encounter (Signed)
Relevant patient education mailed to patient.  

## 2014-03-28 ENCOUNTER — Ambulatory Visit: Payer: Federal, State, Local not specified - PPO | Admitting: Internal Medicine

## 2014-06-20 ENCOUNTER — Telehealth: Payer: Self-pay | Admitting: Internal Medicine

## 2014-06-20 MED ORDER — CEPHALEXIN 500 MG PO CAPS: 500.0000 mg | ORAL_CAPSULE | Freq: Four times a day (QID) | ORAL | Status: DC

## 2014-06-20 NOTE — Telephone Encounter (Signed)
Spoke to pt told him Rx for Cephalexin sent to pharmacy. Pt verbalized understanding.

## 2014-06-20 NOTE — Telephone Encounter (Signed)
Pt has boil on his arm would like if dr Raliegh Ip would send rx cephALEXin (KEFLEX) 500 MG capsule to [facility] /[address]

## 2014-06-20 NOTE — Telephone Encounter (Signed)
Cephalexin 500, #40 one 4 times a day

## 2014-06-25 ENCOUNTER — Other Ambulatory Visit: Payer: Self-pay | Admitting: Internal Medicine

## 2014-09-29 ENCOUNTER — Other Ambulatory Visit: Payer: Self-pay | Admitting: Internal Medicine

## 2014-10-05 ENCOUNTER — Encounter: Payer: Self-pay | Admitting: Internal Medicine

## 2014-10-05 ENCOUNTER — Ambulatory Visit (INDEPENDENT_AMBULATORY_CARE_PROVIDER_SITE_OTHER): Payer: Federal, State, Local not specified - PPO | Admitting: Internal Medicine

## 2014-10-05 VITALS — BP 148/96 | HR 72 | Temp 97.3°F | Ht 71.0 in | Wt 183.0 lb

## 2014-10-05 DIAGNOSIS — E7800 Pure hypercholesterolemia, unspecified: Secondary | ICD-10-CM

## 2014-10-05 DIAGNOSIS — J3089 Other allergic rhinitis: Secondary | ICD-10-CM

## 2014-10-05 DIAGNOSIS — I10 Essential (primary) hypertension: Secondary | ICD-10-CM

## 2014-10-05 MED ORDER — SPIRONOLACTONE 25 MG PO TABS: ORAL_TABLET | ORAL | Status: DC

## 2014-10-05 MED ORDER — BENAZEPRIL HCL 20 MG PO TABS: ORAL_TABLET | ORAL | Status: DC

## 2014-10-05 NOTE — Patient Instructions (Signed)
Limit your sodium (Salt) intake  Please check your blood pressure on a regular basis.  If it is consistently greater than 150/90, please make an office appointment.    It is important that you exercise regularly, at least 20 minutes 3 to 4 times per week.  If you develop chest pain or shortness of breath seek  medical attention.  Return in 6 months for follow-up  

## 2014-10-05 NOTE — Progress Notes (Signed)
Subjective:    Patient ID: Jay Robertson, male    DOB: 23-Nov-1956, 58 y.o.   MRN: [id number]  HPI BP Readings from Last 3 Encounters:  10/05/14 148/96  03/23/14 130/84  02/27/14 69/30   58 year old patient who has long-standing hypertension.  He has been on combination therapy, but self discontinued medication 2 months ago and tried a nonpharmacological approach.  Blood pressure has trended up.  He had 2 blood pressure readings in March, which were well-controlled. He requires FMLA form completion.  Due to his hypertension.  He works at the [employer].  He may be retiring next month  Past Medical History  Diagnosis Date  . ALLERGIC RHINITIS 10/24/2007  . HYPERLIPIDEMIA 10/16/2010  . HYPERTENSION 07/25/2007    History   Social History  . Marital Status: Single    Spouse Name: N/A    Number of Children: N/A  . Years of Education: N/A   Occupational History  . Not on file.   Social History Main Topics  . Smoking status: Never Smoker   . Smokeless tobacco: Never Used  . Alcohol Use: Yes  . Drug Use: No  . Sexual Activity: Not on file   Other Topics Concern  . Not on file   Social History Narrative  . No narrative on file    Past Surgical History  Procedure Laterality Date  . Appendectomy    . Nasal septum surgery      septal deviation, turbinate hypertrophy and obstruction  . Clavicle surgery      No family history on file.  Allergies  Allergen Reactions  . Doxycycline     Current Outpatient Prescriptions on File Prior to Visit  Medication Sig Dispense Refill  . benazepril (LOTENSIN) 20 MG tablet TAKE 1 TABLET (20 MG TOTAL) BY MOUTH DAILY.  90 tablet  0  . spironolactone (ALDACTONE) 25 MG tablet TAKE 1 TABLET (25 MG TOTAL) BY MOUTH DAILY.  90 tablet  0  . VIAGRA 50 MG tablet TAKE 1 TABLET AT BEDTIME AS NEEDED  11 tablet  3  . ketoconazole (NIZORAL) 2 % shampoo APPLY AS DIRECTED TWICE WEEKLY  120 mL  0  . triamcinolone cream (KENALOG) 0.1 % Apply 1  application topically 2 (two) times daily.  30 g  2   No current facility-administered medications on file prior to visit.    BP 148/96  Pulse 72  Temp(Src) 97.3 F (36.3 C) (Oral)  Ht 5\' 11"  (1.803 m)  Wt 183 lb (83.008 kg)  BMI 25.53 kg/m2     Review of Systems  Constitutional: Negative for fever, chills, appetite change and fatigue.  HENT: Negative for congestion, dental problem, ear pain, hearing loss, sore throat, tinnitus, trouble swallowing and voice change.   Eyes: Negative for pain, discharge and visual disturbance.  Respiratory: Negative for cough, chest tightness, wheezing and stridor.   Cardiovascular: Negative for chest pain, palpitations and leg swelling.  Gastrointestinal: Negative for nausea, vomiting, abdominal pain, diarrhea, constipation, blood in stool and abdominal distention.  Genitourinary: Negative for urgency, hematuria, flank pain, discharge, difficulty urinating and genital sores.  Musculoskeletal: Negative for arthralgias, back pain, gait problem, joint swelling, myalgias and neck stiffness.  Skin: Negative for rash.  Neurological: Negative for dizziness, syncope, speech difficulty, weakness, numbness and headaches.  Hematological: Negative for adenopathy. Does not bruise/bleed easily.  Psychiatric/Behavioral: Negative for behavioral problems and dysphoric mood. The patient is not nervous/anxious.        Objective:   Physical Exam  Constitutional: He is oriented to person, place, and time. He appears well-developed.  Blood pressure 160/90  HENT:  Head: Normocephalic.  Right Ear: External ear normal.  Left Ear: External ear normal.  Eyes: Conjunctivae and EOM are normal.  Neck: Normal range of motion.  Cardiovascular: Normal rate and normal heart sounds.   Pulmonary/Chest: Breath sounds normal.  Abdominal: Bowel sounds are normal.  Musculoskeletal: Normal range of motion. He exhibits no edema and no tenderness.  Neurological: He is alert and  oriented to person, place, and time.  Psychiatric: He has a normal mood and affect. His behavior is normal.          Assessment & Plan:   Hypertension.  The patient has resumed antihypertensive medication within the past 48 hours.  We'll continue to track home blood pressure monitoring compliance with his medications.  Encouraged.  Low salt diet recommended as well as regular exercise program Allergic rhinitis, stable  FMLA form completion  Schedule CPX  Medications refilled

## 2014-10-05 NOTE — Progress Notes (Signed)
Pre visit review using our clinic review tool, if applicable. No additional management support is needed unless otherwise documented below in the visit note. 

## 2014-10-16 ENCOUNTER — Telehealth: Payer: Self-pay | Admitting: Internal Medicine

## 2014-10-16 MED ORDER — CIPROFLOXACIN HCL 500 MG PO TABS: 500.0000 mg | ORAL_TABLET | Freq: Two times a day (BID) | ORAL | Status: DC

## 2014-10-16 NOTE — Telephone Encounter (Signed)
Ok #14

## 2014-10-16 NOTE — Telephone Encounter (Signed)
Please advise if okay to refill. 

## 2014-10-16 NOTE — Telephone Encounter (Signed)
Pt would like a refill on generic cipro call into [facility] [address]. Pt has break out on his face. Pt stated his girlfriend has too many animals.

## 2014-10-16 NOTE — Telephone Encounter (Signed)
Pt notified Rx sent to pharmacy

## 2014-11-27 ENCOUNTER — Other Ambulatory Visit: Payer: Self-pay | Admitting: Internal Medicine

## 2015-01-09 ENCOUNTER — Encounter (HOSPITAL_COMMUNITY): Payer: Self-pay | Admitting: Emergency Medicine

## 2015-01-09 ENCOUNTER — Emergency Department (HOSPITAL_COMMUNITY): Payer: Federal, State, Local not specified - PPO

## 2015-01-09 ENCOUNTER — Emergency Department (HOSPITAL_COMMUNITY)
Admission: EM | Admit: 2015-01-09 | Discharge: 2015-01-10 | Disposition: A | Discharge status: Home / Self Care | Payer: Federal, State, Local not specified - PPO | Attending: Emergency Medicine | Admitting: Emergency Medicine

## 2015-01-09 DIAGNOSIS — Z8639 Personal history of other endocrine, nutritional and metabolic disease: Secondary | ICD-10-CM | POA: Diagnosis not present

## 2015-01-09 DIAGNOSIS — H5500 Unspecified nystagmus: Secondary | ICD-10-CM | POA: Diagnosis not present

## 2015-01-09 DIAGNOSIS — I10 Essential (primary) hypertension: Secondary | ICD-10-CM | POA: Insufficient documentation

## 2015-01-09 DIAGNOSIS — Z79899 Other long term (current) drug therapy: Secondary | ICD-10-CM | POA: Insufficient documentation

## 2015-01-09 DIAGNOSIS — R112 Nausea with vomiting, unspecified: Secondary | ICD-10-CM | POA: Diagnosis not present

## 2015-01-09 DIAGNOSIS — R42 Dizziness and giddiness: Secondary | ICD-10-CM | POA: Insufficient documentation

## 2015-01-09 LAB — CBC WITH DIFFERENTIAL/PLATELET
Basophils Absolute: 0 10*3/uL (ref 0.0–0.1)
Basophils Relative: 0 % (ref 0–1)
Eosinophils Absolute: 0.1 10*3/uL (ref 0.0–0.7)
Eosinophils Relative: 2 % (ref 0–5)
HCT: 38.4 % — ABNORMAL LOW (ref 39.0–52.0)
Hemoglobin: 13.7 g/dL (ref 13.0–17.0)
Lymphocytes Relative: 24 % (ref 12–46)
Lymphs Abs: 1.7 10*3/uL (ref 0.7–4.0)
MCH: 31.5 pg (ref 26.0–34.0)
MCHC: 35.7 g/dL (ref 30.0–36.0)
MCV: 88.3 fL (ref 78.0–100.0)
Monocytes Absolute: 0.5 10*3/uL (ref 0.1–1.0)
Monocytes Relative: 7 % (ref 3–12)
Neutro Abs: 5 10*3/uL (ref 1.7–7.7)
Neutrophils Relative %: 67 % (ref 43–77)
Platelets: 242 10*3/uL (ref 150–400)
RBC: 4.35 MIL/uL (ref 4.22–5.81)
RDW: 13.8 % (ref 11.5–15.5)
WBC: 7.3 10*3/uL (ref 4.0–10.5)

## 2015-01-09 LAB — URINALYSIS, ROUTINE W REFLEX MICROSCOPIC
Bilirubin Urine: NEGATIVE
Glucose, UA: NEGATIVE mg/dL
Hgb urine dipstick: NEGATIVE
Ketones, ur: NEGATIVE mg/dL
Leukocytes, UA: NEGATIVE
Nitrite: NEGATIVE
Protein, ur: NEGATIVE mg/dL
Specific Gravity, Urine: 1.023 (ref 1.005–1.030)
Urobilinogen, UA: 0.2 mg/dL (ref 0.0–1.0)
pH: 7 (ref 5.0–8.0)

## 2015-01-09 LAB — BASIC METABOLIC PANEL
Anion gap: 7 (ref 5–15)
BUN: 17 mg/dL (ref 6–23)
CO2: 30 mmol/L (ref 19–32)
Calcium: 9.4 mg/dL (ref 8.4–10.5)
Chloride: 101 mEq/L (ref 96–112)
Creatinine, Ser: 0.98 mg/dL (ref 0.50–1.35)
GFR calc Af Amer: >90 mL/min (ref >90)
GFR calc non Af Amer: 88 mL/min — ABNORMAL LOW (ref >90)
Glucose, Bld: 105 mg/dL — ABNORMAL HIGH (ref 70–99)
Potassium: 5.1 mmol/L (ref 3.5–5.1)
Sodium: 138 mmol/L (ref 135–145)

## 2015-01-09 LAB — TROPONIN I: Troponin I: <0.03 ng/mL (ref <0.031)

## 2015-01-09 MED ORDER — MECLIZINE HCL 25 MG PO TABS
25.0000 mg | ORAL_TABLET | Freq: Once | ORAL | Status: AC
  Administered 2015-01-09: 25 mg via ORAL
  Filled 2015-01-09: qty 1

## 2015-01-09 MED ORDER — FAMOTIDINE IN NACL 20-0.9 MG/50ML-% IV SOLN
20.0000 mg | Freq: Once | INTRAVENOUS | Status: AC
  Administered 2015-01-09: 20 mg via INTRAVENOUS
  Filled 2015-01-09: qty 50

## 2015-01-09 MED ORDER — ONDANSETRON HCL 4 MG/2ML IJ SOLN
4.0000 mg | Freq: Once | INTRAMUSCULAR | Status: AC
  Administered 2015-01-09: 4 mg via INTRAVENOUS
  Filled 2015-01-09: qty 2

## 2015-01-09 MED ORDER — SODIUM CHLORIDE 0.9 % IV BOLUS (SEPSIS)
1000.0000 mL | Freq: Once | INTRAVENOUS | Status: AC
  Administered 2015-01-09: 1000 mL via INTRAVENOUS

## 2015-01-09 NOTE — ED Provider Notes (Signed)
CSN: [id number]     Arrival date & time 01/09/15  1752 History   First MD Initiated Contact with Patient 01/09/15 1800     Chief Complaint  Patient presents with  . Dizziness     (Consider location/radiation/quality/duration/timing/severity/associated sxs/prior Treatment) HPI Comments: Patient is a 59 year old male past medical history significant for hyperlipidemia, hypertension presenting to the emergency department for acute onset dizziness with nausea and vomiting that began at work around 4:30 PM this afternoon. 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 He states his dizziness is aggravated with positional changes. No modifying factors identified. Patient denies any fevers, chills, CP, SOB, syncope, abdominal pain. No history of similar symptoms.    Past Medical History  Diagnosis Date  . ALLERGIC RHINITIS 10/24/2007  . HYPERLIPIDEMIA 10/16/2010  . HYPERTENSION 07/25/2007   Past Surgical History  Procedure Laterality Date  . Appendectomy    . Nasal septum surgery      septal deviation, turbinate hypertrophy and obstruction  . Clavicle surgery     History reviewed. No pertinent family history. History  Substance Use Topics  . Smoking status: Never Smoker   . Smokeless tobacco: Never Used  . Alcohol Use: Yes    Review of Systems  Gastrointestinal: Positive for nausea and vomiting.  Neurological: Positive for dizziness and light-headedness.  All other systems reviewed and are negative.     Allergies  Doxycycline  Home Medications   Prior to Admission medications   Medication Sig Start Date End Date Taking? Authorizing Provider  benazepril (LOTENSIN) 20 MG tablet TAKE 1 TABLET (20 MG TOTAL) BY MOUTH DAILY. 10/05/14  Yes Marletta Lor, MD  fexofenadine (ALLEGRA) 180 MG tablet Take 180 mg by mouth daily.   Yes Historical Provider, MD  spironolactone (ALDACTONE) 25 MG tablet TAKE 1 TABLET (25 MG TOTAL) BY MOUTH DAILY. 10/05/14  Yes  Marletta Lor, MD  tetrahydrozoline 0.05 % ophthalmic solution Place 1-2 drops into both eyes 2 (two) times daily as needed (for dry eyes and irritation).   Yes Historical Provider, MD  VIAGRA 50 MG tablet TAKE 1 TABLET AT BEDTIME AS NEEDED 11/28/14  Yes Marletta Lor, MD  ciprofloxacin (CIPRO) 500 MG tablet Take 1 tablet (500 mg total) by mouth 2 (two) times daily. Patient not taking: Reported on 01/09/2015 10/16/14   Marletta Lor, MD  ketoconazole (NIZORAL) 2 % shampoo APPLY AS DIRECTED TWICE WEEKLY Patient not taking: Reported on 01/09/2015 03/20/14   Marletta Lor, MD  meclizine (ANTIVERT) 50 MG tablet Take 1 tablet (50 mg total) by mouth 3 (three) times daily as needed. 01/10/15   Jennifer L Piepenbrink, PA-C  triamcinolone cream (KENALOG) 0.1 % Apply 1 application topically 2 (two) times daily. Patient not taking: Reported on 01/09/2015 02/27/14   Lucretia Kern, DO   BP 130/80 mmHg  Pulse 63  Temp(Src) 98 F (36.7 C) (Oral)  Resp 20  Ht 5\' 11"  (1.803 m)  Wt 174 lb (78.926 kg)  BMI 24.28 kg/m2  SpO2 99% Physical Exam  Constitutional: He is oriented to person, place, and time. He appears well-developed and well-nourished. No distress.  HENT:  Head: Normocephalic and atraumatic.  Right Ear: Hearing, tympanic membrane, external ear and ear canal normal.  Left Ear: Hearing, tympanic membrane, external ear and ear canal normal.  Nose: Nose normal.  Mouth/Throat: Oropharynx is clear and moist. No oropharyngeal exudate.  Eyes: Conjunctivae and lids are normal. Pupils are equal, round, and reactive  to light. Right eye exhibits nystagmus (horizontal). Left eye exhibits nystagmus (horizontal).  Neck: Normal range of motion. Neck supple.  Cardiovascular: Normal rate, regular rhythm, normal heart sounds and intact distal pulses.   Pulmonary/Chest: Effort normal and breath sounds normal. No respiratory distress.  Abdominal: Soft. There is no tenderness.  Neurological: He is  alert and oriented to person, place, and time. He has normal strength. No cranial nerve deficit. Gait normal. GCS eye subscore is 4. GCS verbal subscore is 5. GCS motor subscore is 6.  Sensation grossly intact.  No pronator drift.  Bilateral heel-knee-shin intact.  Skin: Skin is warm and dry. He is not diaphoretic.  Nursing note and vitals reviewed.   ED Course  Procedures (including critical care time) Medications  sodium chloride 0.9 % bolus 1,000 mL (0 mLs Intravenous Stopped 01/09/15 2003)  meclizine (ANTIVERT) tablet 25 mg (25 mg Oral Given 01/09/15 1847)  ondansetron (ZOFRAN) injection 4 mg (4 mg Intravenous Given 01/09/15 2122)  famotidine (PEPCID) IVPB 20 mg (0 mg Intravenous Stopped 01/09/15 2243)  meclizine (ANTIVERT) tablet 25 mg (25 mg Oral Given 01/10/15 0024)    Labs Review Labs Reviewed  BASIC METABOLIC PANEL - Abnormal; Notable for the following:    Glucose, Bld 105 (*)    GFR calc non Af Amer 88 (*)    All other components within normal limits  CBC WITH DIFFERENTIAL - Abnormal; Notable for the following:    HCT 38.4 (*)    All other components within normal limits  URINALYSIS, ROUTINE W REFLEX MICROSCOPIC - Abnormal; Notable for the following:    APPearance HAZY (*)    All other components within normal limits  TROPONIN I    Imaging Review Ct Head Wo Contrast  01/09/2015   CLINICAL DATA:  Dizziness, no loss of consciousness, alert and oriented  EXAM: CT HEAD WITHOUT CONTRAST  TECHNIQUE: Contiguous axial images were obtained from the base of the skull through the vertex without intravenous contrast.  COMPARISON:  None.  FINDINGS: There is no evidence of mass effect, midline shift or extra-axial fluid collections. There is no evidence of a space-occupying lesion or intracranial hemorrhage. There is no evidence of a cortical-based area of acute infarction.  The ventricles and sulci are appropriate for the patient's age. The basal cisterns are patent.  Visualized portions  of the orbits are unremarkable. The visualized portions of the paranasal sinuses and mastoid air cells are unremarkable.  The osseous structures are unremarkable.  IMPRESSION: Normal CT of the brain without intravenous contrast.   Electronically Signed   By: Kathreen Devoid   On: 01/09/2015 20:11     EKG Interpretation   Date/Time:  Wednesday January 09 2015 17:58:38 EST Ventricular Rate:  63 PR Interval:  196 QRS Duration: 103 QT Interval:  409 QTC Calculation: 419 R Axis:   76 Text Interpretation:  Sinus rhythm Abnormal R-wave progression, early  transition ST elevation suggests acute pericarditis No old tracing to  compare Confirmed by Winfred Leeds  MD, SAM [id number]) on 01/09/2015 6:07:00 PM      MDM   Final diagnoses:  Dizziness  Vertigo    Filed Vitals:   01/09/15 2300  BP: 130/80  Pulse: 63  Temp:   Resp:    Afebrile, NAD, non-toxic appearing, AAOx4. I have reviewed nursing notes, vital signs, and all appropriate lab and imaging results for this patient. No neurofocal deficits on examination. Symptoms induced with position changes. Symptoms improved with Meclizine, patient able to ambulate. Second episode  of nausea and vomiting in the ED, antiemetics given and patient passed PO challenge. Given acute severe onset of symptoms with reproducibility likely peripheral etiology rather than central. Return precautions discussed. Patient is agreeable to plan. Patient is stable at time of discharge. Patient d/w with Dr. Winfred Leeds, agrees with plan.      Harlow Mares, PA-C 01/10/15 0110  Orlie Dakin, MD 01/10/15 0110

## 2015-01-09 NOTE — ED Notes (Signed)
Pt ambulated in the hallway with no assistance. Pt had no c/o dizziness. Pt got back in bed and was nauseas and vomited x1. EDP notified

## 2015-01-09 NOTE — ED Notes (Signed)
Water given to pt 

## 2015-01-09 NOTE — ED Notes (Signed)
Pt has drank 2 cups of water at this time without any problems.

## 2015-01-09 NOTE — ED Notes (Signed)
Pt reports dizziness no N/V. Notified EDP

## 2015-01-09 NOTE — ED Notes (Signed)
Patient transported to CT 

## 2015-01-09 NOTE — ED Notes (Signed)
Per EMS, pt comes from work at [employer] with c/o of dizziness/lightheadness with n/v. No syncope or LOC. Pt A&OX4, NAD noted. Pt denies chest pain, SOB or headache. Pt has h/o hypertension. Pt took BP med at 1545. VSS: BP 140/80, P66, R16, 96% rm air, CBG 111.

## 2015-01-10 MED ORDER — MECLIZINE HCL 50 MG PO TABS: 50.0000 mg | ORAL_TABLET | Freq: Three times a day (TID) | ORAL | Status: DC | PRN

## 2015-01-10 MED ORDER — MECLIZINE HCL 25 MG PO TABS
25.0000 mg | ORAL_TABLET | Freq: Once | ORAL | Status: AC
  Administered 2015-01-10: 25 mg via ORAL
  Filled 2015-01-10: qty 1

## 2015-01-10 NOTE — Discharge Instructions (Signed)
Please follow up with your primary care physician in 1-2 days. If you do not have one please call the [place] [facility] number listed above. Please take Meclizine as prescribed. Please read all discharge instructions and return precautions.     Benign Positional Vertigo Vertigo means you feel like you or your surroundings are moving when they are not. Benign positional vertigo is the most common form of vertigo. Benign means that the cause of your condition is not serious. Benign positional vertigo is more common in older adults. CAUSES  Benign positional vertigo is the result of an upset in the labyrinth system. This is an area in the middle ear that helps control your balance. This may be caused by a viral infection, head injury, or repetitive motion. However, often no specific cause is found. SYMPTOMS  Symptoms of benign positional vertigo occur when you move your head or eyes in different directions. Some of the symptoms may include:  Loss of balance and falls.  Vomiting.  Blurred vision.  Dizziness.  Nausea.  Involuntary eye movements (nystagmus). DIAGNOSIS  Benign positional vertigo is usually diagnosed by physical exam. If the specific cause of your benign positional vertigo is unknown, your caregiver may perform imaging tests, such as magnetic resonance imaging (MRI) or computed tomography (CT). TREATMENT  Your caregiver may recommend movements or procedures to correct the benign positional vertigo. Medicines such as meclizine, benzodiazepines, and medicines for nausea may be used to treat your symptoms. In rare cases, if your symptoms are caused by certain conditions that affect the inner ear, you may need surgery. HOME CARE INSTRUCTIONS   Follow your caregiver's instructions.  Move slowly. Do not make sudden body or head movements.  Avoid driving.  Avoid operating heavy machinery.  Avoid performing any tasks that would be dangerous to you or others during a  vertigo episode.  Drink enough fluids to keep your urine clear or pale yellow. SEEK IMMEDIATE MEDICAL CARE IF:   You develop problems with walking, weakness, numbness, or using your arms, hands, or legs.  You have difficulty speaking.  You develop severe headaches.  Your nausea or vomiting continues or gets worse.  You develop visual changes.  Your family or friends notice any behavioral changes.  Your condition gets worse.  You have a fever.  You develop a stiff neck or sensitivity to light. MAKE SURE YOU:   Understand these instructions.  Will watch your condition.  Will get help right away if you are not doing well or get worse. Document Released: 09/21/2006 Document Revised: 03/07/2012 Document Reviewed: 09/03/2011 [facility] Patient Information 2015 [place], [place]. This information is not intended to replace advice given to you by your health care provider. Make sure you discuss any questions you have with your health care provider.

## 2015-01-14 ENCOUNTER — Ambulatory Visit: Payer: Federal, State, Local not specified - PPO | Admitting: Internal Medicine

## 2015-01-31 ENCOUNTER — Encounter: Payer: Self-pay | Admitting: Internal Medicine

## 2015-01-31 ENCOUNTER — Ambulatory Visit (INDEPENDENT_AMBULATORY_CARE_PROVIDER_SITE_OTHER): Payer: Federal, State, Local not specified - PPO | Admitting: Internal Medicine

## 2015-01-31 VITALS — BP 130/88 | HR 74 | Temp 98.0°F | Resp 20 | Ht 71.0 in | Wt 183.0 lb

## 2015-01-31 DIAGNOSIS — I10 Essential (primary) hypertension: Secondary | ICD-10-CM

## 2015-01-31 DIAGNOSIS — E7800 Pure hypercholesterolemia, unspecified: Secondary | ICD-10-CM

## 2015-01-31 DIAGNOSIS — J3089 Other allergic rhinitis: Secondary | ICD-10-CM

## 2015-01-31 NOTE — Progress Notes (Signed)
Subjective:    Patient ID: Jay Robertson, male    DOB: 03-31-1956, 59 y.o.   MRN: [id number]  HPI  BP Readings from Last 3 Encounters:  01/31/15 130/88  01/09/15 130/80  10/05/14 79/63   59 year old patient who is seen today in follow-up.  He was seen in the ED approximately 2 weeks ago for vertigo.  This was fairly brief and self-limited.  Generally has done fairly well.  Has had some blood pressures concerns with some readings in a low-normal range.  He also has had some palpitations that have improved since cutting back on his caffeine load.  EKGs in the past reviewed and have revealed PVCs. Denies any chest pain or shortness of breath  Past Medical History  Diagnosis Date  . ALLERGIC RHINITIS 10/24/2007  . HYPERLIPIDEMIA 10/16/2010  . HYPERTENSION 07/25/2007    History   Social History  . Marital Status: Single    Spouse Name: N/A    Number of Children: N/A  . Years of Education: N/A   Occupational History  . Not on file.   Social History Main Topics  . Smoking status: Never Smoker   . Smokeless tobacco: Never Used  . Alcohol Use: Yes  . Drug Use: No  . Sexual Activity: Not on file   Other Topics Concern  . Not on file   Social History Narrative    Past Surgical History  Procedure Laterality Date  . Appendectomy    . Nasal septum surgery      septal deviation, turbinate hypertrophy and obstruction  . Clavicle surgery      No family history on file.  Allergies  Allergen Reactions  . Doxycycline Other (See Comments)    Headaches    Current Outpatient Prescriptions on File Prior to Visit  Medication Sig Dispense Refill  . benazepril (LOTENSIN) 20 MG tablet TAKE 1 TABLET (20 MG TOTAL) BY MOUTH DAILY. 90 tablet 0  . fexofenadine (ALLEGRA) 180 MG tablet Take 180 mg by mouth daily.    . meclizine (ANTIVERT) 50 MG tablet Take 1 tablet (50 mg total) by mouth 3 (three) times daily as needed. 30 tablet 0  . spironolactone (ALDACTONE) 25 MG tablet TAKE 1  TABLET (25 MG TOTAL) BY MOUTH DAILY. 90 tablet 0  . tetrahydrozoline 0.05 % ophthalmic solution Place 1-2 drops into both eyes 2 (two) times daily as needed (for dry eyes and irritation).    Marland Kitchen VIAGRA 50 MG tablet TAKE 1 TABLET AT BEDTIME AS NEEDED 11 tablet 0   No current facility-administered medications on file prior to visit.    BP 130/88 mmHg  Pulse 74  Temp(Src) 98 F (36.7 C) (Oral)  Resp 20  Ht 5\' 11"  (1.803 m)  Wt 183 lb (83.008 kg)  BMI 25.53 kg/m2  SpO2 98%    Review of Systems  Constitutional: Negative for fever, chills, appetite change and fatigue.  HENT: Negative for congestion, dental problem, ear pain, hearing loss, sore throat, tinnitus, trouble swallowing and voice change.   Eyes: Negative for pain, discharge and visual disturbance.  Respiratory: Negative for cough, chest tightness, wheezing and stridor.   Cardiovascular: Positive for palpitations. Negative for chest pain and leg swelling.  Gastrointestinal: Negative for nausea, vomiting, abdominal pain, diarrhea, constipation, blood in stool and abdominal distention.  Genitourinary: Negative for urgency, hematuria, flank pain, discharge, difficulty urinating and genital sores.  Musculoskeletal: Negative for myalgias, back pain, joint swelling, arthralgias, gait problem and neck stiffness.  Skin: Negative for rash.  Neurological: Negative for dizziness, syncope, speech difficulty, weakness, numbness and headaches.  Hematological: Negative for adenopathy. Does not bruise/bleed easily.  Psychiatric/Behavioral: Negative for behavioral problems and dysphoric mood. The patient is not nervous/anxious.        Objective:   Physical Exam  Constitutional: He is oriented to person, place, and time. He appears well-developed.  Blood pressure 130/84  HENT:  Head: Normocephalic.  Right Ear: External ear normal.  Left Ear: External ear normal.  Eyes: Conjunctivae and EOM are normal.  Neck: Normal range of motion.    Cardiovascular: Normal rate, regular rhythm and normal heart sounds.   No ectopics noted during the course of the exam  Pulmonary/Chest: Breath sounds normal.  Abdominal: Bowel sounds are normal.  Musculoskeletal: Normal range of motion. He exhibits no edema or tenderness.  Neurological: He is alert and oriented to person, place, and time.  Psychiatric: He has a normal mood and affect. His behavior is normal.          Assessment & Plan:   Palpitations.  These have improved with the cutting back on caffeine.  We'll continue to observe.  Discussed at length and patient information dispensed Hypertension.  Blood pressure appears to be well controlled and flex weight from a low-normal to a high normal range.  No change in therapy.  We'll continue home blood pressure monitoring and low-salt diet  CPX 6 months

## 2015-01-31 NOTE — Patient Instructions (Signed)
Limit your sodium (Salt) intake  Please check your blood pressure on a regular basis.  If it is consistently greater than 150/90, please make an office appointment.  Palpitations A palpitation is the feeling that your heartbeat is irregular or is faster than normal. It may feel like your heart is fluttering or skipping a beat. Palpitations are usually not a serious problem. However, in some cases, you may need further medical evaluation. CAUSES  Palpitations can be caused by:  Smoking.  Caffeine or other stimulants, such as diet pills or energy drinks.  Alcohol.  Stress and anxiety.  Strenuous physical activity.  Fatigue.  Certain medicines.  Heart disease, especially if you have a history of irregular heart rhythms (arrhythmias), such as atrial fibrillation, atrial flutter, or supraventricular tachycardia.  An improperly working pacemaker or defibrillator. DIAGNOSIS  To find the cause of your palpitations, your health care provider will take your medical history and perform a physical exam. Your health care provider may also have you take a test called an ambulatory electrocardiogram (ECG). An ECG records your heartbeat patterns over a 24-hour period. You may also have other tests, such as:  Transthoracic echocardiogram (TTE). During echocardiography, sound waves are used to evaluate how blood flows through your heart.  Transesophageal echocardiogram (TEE).  Cardiac monitoring. This allows your health care provider to monitor your heart rate and rhythm in real time.  Holter monitor. This is a portable device that records your heartbeat and can help diagnose heart arrhythmias. It allows your health care provider to track your heart activity for several days, if needed.  Stress tests by exercise or by giving medicine that makes the heart beat faster. TREATMENT  Treatment of palpitations depends on the cause of your symptoms and can vary greatly. Most cases of palpitations do not  require any treatment other than time, relaxation, and monitoring your symptoms. Other causes, such as atrial fibrillation, atrial flutter, or supraventricular tachycardia, usually require further treatment. HOME CARE INSTRUCTIONS   Avoid:  Caffeinated coffee, tea, soft drinks, diet pills, and energy drinks.  Chocolate.  Alcohol.  Stop smoking if you smoke.  Reduce your stress and anxiety. Things that can help you relax include:  A method of controlling things in your body, such as your heartbeats, with your mind (biofeedback).  Yoga.  Meditation.  Physical activity such as swimming, jogging, or walking.  Get plenty of rest and sleep. SEEK MEDICAL CARE IF:   You continue to have a fast or irregular heartbeat beyond 24 hours.  Your palpitations occur more often. SEEK IMMEDIATE MEDICAL CARE IF:  You have chest pain or shortness of breath.  You have a severe headache.  You feel dizzy or you faint. MAKE SURE YOU:  Understand these instructions.  Will watch your condition.  Will get help right away if you are not doing well or get worse. Document Released: 12/11/2000 Document Revised: 12/19/2013 Document Reviewed: 02/12/2012 [facility] Patient Information 2015 [place], [place]. This information is not intended to replace advice given to you by your health care provider. Make sure you discuss any questions you have with your health care provider.

## 2015-01-31 NOTE — Progress Notes (Signed)
Pre visit review using our clinic review tool, if applicable. No additional management support is needed unless otherwise documented below in the visit note. 

## 2015-02-08 ENCOUNTER — Other Ambulatory Visit: Payer: Self-pay | Admitting: Internal Medicine

## 2015-04-07 ENCOUNTER — Other Ambulatory Visit: Payer: Self-pay | Admitting: Internal Medicine

## 2015-07-04 ENCOUNTER — Encounter: Payer: Self-pay | Admitting: Internal Medicine

## 2015-07-04 ENCOUNTER — Ambulatory Visit (INDEPENDENT_AMBULATORY_CARE_PROVIDER_SITE_OTHER): Payer: Federal, State, Local not specified - PPO | Admitting: Internal Medicine

## 2015-07-04 ENCOUNTER — Other Ambulatory Visit: Payer: Self-pay | Admitting: Internal Medicine

## 2015-07-04 VITALS — BP 130/90 | HR 70 | Temp 98.1°F | Resp 20 | Ht 71.0 in | Wt 191.0 lb

## 2015-07-04 DIAGNOSIS — E7800 Pure hypercholesterolemia, unspecified: Secondary | ICD-10-CM

## 2015-07-04 DIAGNOSIS — L03116 Cellulitis of left lower limb: Secondary | ICD-10-CM | POA: Diagnosis not present

## 2015-07-04 DIAGNOSIS — I10 Essential (primary) hypertension: Secondary | ICD-10-CM | POA: Diagnosis not present

## 2015-07-04 MED ORDER — AMOXICILLIN-POT CLAVULANATE 875-125 MG PO TABS: 1.0000 | ORAL_TABLET | Freq: Two times a day (BID) | ORAL | Status: DC

## 2015-07-04 NOTE — Progress Notes (Signed)
Subjective:    Patient ID: Jay Robertson, male    DOB: September 20, 1956, 59 y.o.   MRN: [id number]  HPI  BP Readings from Last 3 Encounters:  07/04/15 130/90  01/31/15 130/88  01/09/15 106/24   59 year old patient who is seen today for his six-month follow-up of essential hypertension. For the past 5 days has had increasing pain, swelling and redness involving the lateral aspect of his left lower leg just distal to his knee.  He did have a mild injury in breakage of the skin in this area prior to the onset of pain and swelling.  There is been no fever or constitutional complaints. Otherwise, doing quite well  Past Medical History  Diagnosis Date  . ALLERGIC RHINITIS 10/24/2007  . HYPERLIPIDEMIA 10/16/2010  . HYPERTENSION 07/25/2007    History   Social History  . Marital Status: Single    Spouse Name: N/A  . Number of Children: N/A  . Years of Education: N/A   Occupational History  . Not on file.   Social History Main Topics  . Smoking status: Never Smoker   . Smokeless tobacco: Never Used  . Alcohol Use: Yes  . Drug Use: No  . Sexual Activity: Not on file   Other Topics Concern  . Not on file   Social History Narrative    Past Surgical History  Procedure Laterality Date  . Appendectomy    . Nasal septum surgery      septal deviation, turbinate hypertrophy and obstruction  . Clavicle surgery      No family history on file.  Allergies  Allergen Reactions  . Doxycycline Other (See Comments)    Headaches    Current Outpatient Prescriptions on File Prior to Visit  Medication Sig Dispense Refill  . benazepril (LOTENSIN) 20 MG tablet TAKE 1 TABLET (20 MG TOTAL) BY MOUTH DAILY. 90 tablet 0  . fexofenadine (ALLEGRA) 180 MG tablet Take 180 mg by mouth daily.    Marland Kitchen spironolactone (ALDACTONE) 25 MG tablet TAKE 1 TABLET (25 MG TOTAL) BY MOUTH DAILY. 90 tablet 0  . tetrahydrozoline 0.05 % ophthalmic solution Place 1-2 drops into both eyes 2 (two) times daily as needed (for  dry eyes and irritation).    Marland Kitchen VIAGRA 50 MG tablet TAKE 1 TABLET AT BEDTIME AS NEEDED 11 tablet 0   No current facility-administered medications on file prior to visit.    BP 130/90 mmHg  Pulse 70  Temp(Src) 98.1 F (36.7 C) (Oral)  Resp 20  Ht 5\' 11"  (1.803 m)  Wt 191 lb (86.637 kg)  BMI 26.65 kg/m2  SpO2 98%      Review of Systems  Constitutional: Negative for fever, chills, appetite change and fatigue.  HENT: Negative for congestion, dental problem, ear pain, hearing loss, sore throat, tinnitus, trouble swallowing and voice change.   Eyes: Negative for pain, discharge and visual disturbance.  Respiratory: Negative for cough, chest tightness, wheezing and stridor.   Cardiovascular: Negative for chest pain, palpitations and leg swelling.  Gastrointestinal: Negative for nausea, vomiting, abdominal pain, diarrhea, constipation, blood in stool and abdominal distention.  Genitourinary: Negative for urgency, hematuria, flank pain, discharge, difficulty urinating and genital sores.  Musculoskeletal: Negative for myalgias, back pain, joint swelling, arthralgias, gait problem and neck stiffness.  Skin: Positive for rash.  Neurological: Negative for dizziness, syncope, speech difficulty, weakness, numbness and headaches.  Hematological: Negative for adenopathy. Does not bruise/bleed easily.  Psychiatric/Behavioral: Negative for behavioral problems and dysphoric mood. The patient is  not nervous/anxious.        Objective:   Physical Exam  Constitutional: He appears well-developed and well-nourished. No distress.  Repeat blood pressure 122/80  Skin:  Area of cellulitis approximately 6 x 8 cm involving the left lateral lower leg just distal to the knee          Assessment & Plan:   Hypertension, well-controlled Cellulitis, left lower leg.  Will treat with Augmentin.  Patient instructions discussed and dispensed  CPX 6 months

## 2015-07-04 NOTE — Progress Notes (Signed)
Pre visit review using our clinic review tool, if applicable. No additional management support is needed unless otherwise documented below in the visit note. 

## 2015-07-04 NOTE — Patient Instructions (Addendum)
Cellulitis Cellulitis is an infection of the skin and the tissue beneath it. The infected area is usually red and tender. Cellulitis occurs most often in the arms and lower legs.  CAUSES  Cellulitis is caused by bacteria that enter the skin through cracks or cuts in the skin. The most common types of bacteria that cause cellulitis are staphylococci and streptococci. SIGNS AND SYMPTOMS   Redness and warmth.  Swelling.  Tenderness or pain.  Fever. DIAGNOSIS  Your health care provider can usually determine what is wrong based on a physical exam. Blood tests may also be done. TREATMENT  Treatment usually involves taking an antibiotic medicine. HOME CARE INSTRUCTIONS   Take your antibiotic medicine as directed by your health care provider. Finish the antibiotic even if you start to feel better.  Keep the infected arm or leg elevated to reduce swelling.  Apply a warm cloth to the affected area up to 4 times per day to relieve pain.  Take medicines only as directed by your health care provider.  Keep all follow-up visits as directed by your health care provider. SEEK MEDICAL CARE IF:   You notice red streaks coming from the infected area.  Your red area gets larger or turns dark in color.  Your bone or joint underneath the infected area becomes painful after the skin has healed.  Your infection returns in the same area or another area.  You notice a swollen bump in the infected area.  You develop new symptoms.  You have a fever. SEEK IMMEDIATE MEDICAL CARE IF:   You feel very sleepy.  You develop vomiting or diarrhea.  You have a general ill feeling (malaise) with muscle aches and pains. MAKE SURE YOU:   Understand these instructions.  Will watch your condition.  Will get help right away if you are not doing well or get worse. Document Released: 09/23/2005 Document Revised: 04/30/2014 Document Reviewed: 02/29/2012 [facility] Patient Information 2015 [place], [place].  This information is not intended to replace advice given to you by your health care provider. Make sure you discuss any questions you have with your health care provider.  Limit your sodium (Salt) intake  Please check your blood pressure on a regular basis.  If it is consistently greater than 150/90, please make an office appointment.  Return in 6 months for follow-up

## 2015-07-08 ENCOUNTER — Ambulatory Visit: Payer: Federal, State, Local not specified - PPO | Admitting: Internal Medicine

## 2015-07-29 ENCOUNTER — Other Ambulatory Visit: Payer: Self-pay | Admitting: Internal Medicine

## 2015-10-01 ENCOUNTER — Other Ambulatory Visit: Payer: Self-pay | Admitting: Internal Medicine

## 2015-10-08 ENCOUNTER — Telehealth: Payer: Self-pay | Admitting: Internal Medicine

## 2015-10-08 MED ORDER — AMOXICILLIN-POT CLAVULANATE 875-125 MG PO TABS: 1.0000 | ORAL_TABLET | Freq: Two times a day (BID) | ORAL | Status: DC

## 2015-10-08 NOTE — Telephone Encounter (Signed)
Pt needs refill on amoxicillin-clavulanate (AUGMENTIN) 875-125 MG per tablet for cellulitis.

## 2015-10-08 NOTE — Telephone Encounter (Signed)
Please advise if okay for refill.

## 2015-10-08 NOTE — Telephone Encounter (Signed)
Ok to rf? 

## 2015-10-08 NOTE — Telephone Encounter (Signed)
Left message on voicemail Rx sent to pharmacy as requested. 

## 2015-11-19 ENCOUNTER — Other Ambulatory Visit: Payer: Self-pay | Admitting: Family Medicine

## 2015-12-04 ENCOUNTER — Telehealth: Payer: Self-pay | Admitting: Internal Medicine

## 2015-12-04 MED ORDER — AMOXICILLIN-POT CLAVULANATE 875-125 MG PO TABS: 1.0000 | ORAL_TABLET | Freq: Two times a day (BID) | ORAL | Status: DC

## 2015-12-04 NOTE — Telephone Encounter (Signed)
Pt request refill of the following: amoxicillin-clavulanate (AUGMENTIN) 875-125 MG tablet   Phamacy: [place]

## 2015-12-04 NOTE — Telephone Encounter (Signed)
Please see message and advise if refill okay? 

## 2015-12-04 NOTE — Telephone Encounter (Signed)
Spoke to pt, said he has a boil and you usually give him antibiotic. Okay to refill?

## 2015-12-04 NOTE — Telephone Encounter (Signed)
Is this for recurrent cellulitis?

## 2015-12-04 NOTE — Telephone Encounter (Signed)
Ok 20

## 2015-12-04 NOTE — Telephone Encounter (Signed)
Left message on personal voicemail Rx sent to pharmacy as requested. 

## 2015-12-10 ENCOUNTER — Ambulatory Visit: Payer: Federal, State, Local not specified - PPO | Admitting: Internal Medicine

## 2016-01-01 ENCOUNTER — Other Ambulatory Visit: Payer: Self-pay | Admitting: Internal Medicine

## 2016-01-08 ENCOUNTER — Other Ambulatory Visit: Payer: Self-pay | Admitting: Internal Medicine

## 2016-01-08 ENCOUNTER — Telehealth: Payer: Self-pay | Admitting: Internal Medicine

## 2016-01-08 MED ORDER — DOXYCYCLINE HYCLATE 100 MG PO TABS: 100.0000 mg | ORAL_TABLET | Freq: Two times a day (BID) | ORAL | Status: DC

## 2016-01-08 MED ORDER — AMOXICILLIN-POT CLAVULANATE 875-125 MG PO TABS: 1.0000 | ORAL_TABLET | Freq: Two times a day (BID) | ORAL | Status: DC

## 2016-01-08 NOTE — Telephone Encounter (Signed)
Spoke to pt, told him Rx for Doxycycline was sent to pharmacy. Pt said he can not take Doxy gets headaches. Told pt okay will discuss with Dr. Raliegh Ip and get back to him. Pt verbalized understanding.   Discussed with Dr. Raliegh Ip, verbal order given for Augmentin 875 mg one BID, disp # 14.  Called pt back told him Rx changed to Augmentin one twice a day x 7 days. If symptoms do not improve with in a day or two need to schedule appt. Pt verbalized understanding.

## 2016-01-08 NOTE — Telephone Encounter (Signed)
Doxycycline 100 mg #14 one twice a day Follow-up office visit.  If there is not prompt clinical improvement

## 2016-01-08 NOTE — Telephone Encounter (Signed)
Pt was last seen in July 2016. Pt would like cipro call [facility] [address] for cellulitis

## 2016-01-08 NOTE — Telephone Encounter (Signed)
Please see message and advise 

## 2016-01-15 ENCOUNTER — Encounter: Payer: Self-pay | Admitting: Internal Medicine

## 2016-01-15 ENCOUNTER — Ambulatory Visit (INDEPENDENT_AMBULATORY_CARE_PROVIDER_SITE_OTHER): Payer: Federal, State, Local not specified - PPO | Admitting: Internal Medicine

## 2016-01-15 VITALS — BP 128/90 | HR 72 | Temp 98.3°F | Resp 20 | Ht 71.0 in | Wt 179.0 lb

## 2016-01-15 DIAGNOSIS — I10 Essential (primary) hypertension: Secondary | ICD-10-CM | POA: Diagnosis not present

## 2016-01-15 DIAGNOSIS — J309 Allergic rhinitis, unspecified: Secondary | ICD-10-CM

## 2016-01-15 NOTE — Progress Notes (Signed)
Subjective:    Patient ID: Jay Robertson, male    DOB: 1956/05/12, 60 y.o.   MRN: [id number]  HPI  60 year old patient who is seen today for follow-up.  He has essential hypertension.  He is still well today without any major concerns or complaints.  He states he may be retiring later this year.  He has allergic rhinitis which has been stable.  History of mild dyslipidemia.  Past Medical History  Diagnosis Date  . ALLERGIC RHINITIS 10/24/2007  . HYPERLIPIDEMIA 10/16/2010  . HYPERTENSION 07/25/2007    Social History   Social History  . Marital Status: Single    Spouse Name: N/A  . Number of Children: N/A  . Years of Education: N/A   Occupational History  . Not on file.   Social History Main Topics  . Smoking status: Never Smoker   . Smokeless tobacco: Never Used  . Alcohol Use: Yes  . Drug Use: No  . Sexual Activity: Not on file   Other Topics Concern  . Not on file   Social History Narrative    Past Surgical History  Procedure Laterality Date  . Appendectomy    . Nasal septum surgery      septal deviation, turbinate hypertrophy and obstruction  . Clavicle surgery      No family history on file.  Allergies  Allergen Reactions  . Doxycycline Other (See Comments)    Headaches    Current Outpatient Prescriptions on File Prior to Visit  Medication Sig Dispense Refill  . amoxicillin-clavulanate (AUGMENTIN) 875-125 MG tablet Take 1 tablet by mouth 2 (two) times daily. 14 tablet 0  . benazepril (LOTENSIN) 20 MG tablet TAKE 1 TABLET (20 MG TOTAL) BY MOUTH DAILY. 90 tablet 0  . doxycycline (VIBRA-TABS) 100 MG tablet Take 1 tablet (100 mg total) by mouth 2 (two) times daily. 14 tablet 0  . fexofenadine (ALLEGRA) 180 MG tablet Take 180 mg by mouth daily.    Marland Kitchen ketoconazole (NIZORAL) 2 % shampoo APPLY AS DIRECTED TWICE WEEKLY 120 mL 0  . spironolactone (ALDACTONE) 25 MG tablet TAKE 1 TABLET (25 MG TOTAL) BY MOUTH DAILY. 90 tablet 0  . tetrahydrozoline 0.05 % ophthalmic  solution Place 1-2 drops into both eyes 2 (two) times daily as needed (for dry eyes and irritation).    . triamcinolone cream (KENALOG) 0.1 % APPLY 1 APPLICATION TOPICALLY 2 (TWO) TIMES DAILY. 30 g 2  . VIAGRA 50 MG tablet TAKE 1 TABLET AT BEDTIME AS NEEDED 11 tablet 2   No current facility-administered medications on file prior to visit.    BP 128/90 mmHg  Pulse 72  Temp(Src) 98.3 F (36.8 C) (Oral)  Resp 20  Ht 5\' 11"  (1.803 m)  Wt 179 lb (81.194 kg)  BMI 24.98 kg/m2  SpO2 97%     Review of Systems  Constitutional: Negative for fever, chills, appetite change and fatigue.  HENT: Negative for congestion, dental problem, ear pain, hearing loss, sore throat, tinnitus, trouble swallowing and voice change.   Eyes: Negative for pain, discharge and visual disturbance.  Respiratory: Negative for cough, chest tightness, wheezing and stridor.   Cardiovascular: Negative for chest pain, palpitations and leg swelling.  Gastrointestinal: Negative for nausea, vomiting, abdominal pain, diarrhea, constipation, blood in stool and abdominal distention.  Genitourinary: Negative for urgency, hematuria, flank pain, discharge, difficulty urinating and genital sores.  Musculoskeletal: Negative for myalgias, back pain, joint swelling, arthralgias, gait problem and neck stiffness.  Skin: Negative for rash.  Neurological:  Negative for dizziness, syncope, speech difficulty, weakness, numbness and headaches.  Hematological: Negative for adenopathy. Does not bruise/bleed easily.  Psychiatric/Behavioral: Negative for behavioral problems and dysphoric mood. The patient is not nervous/anxious.        Objective:   Physical Exam  Constitutional: He is oriented to person, place, and time. He appears well-developed.  Repeat blood pressure improved 120 over 80  HENT:  Head: Normocephalic.  Right Ear: External ear normal.  Left Ear: External ear normal.  Eyes: Conjunctivae and EOM are normal.  Neck: Normal  range of motion.  Cardiovascular: Normal rate and normal heart sounds.   Pulmonary/Chest: Breath sounds normal.  Abdominal: Bowel sounds are normal.  Musculoskeletal: Normal range of motion. He exhibits no edema or tenderness.  Neurological: He is alert and oriented to person, place, and time.  Psychiatric: He has a normal mood and affect. His behavior is normal.          Assessment & Plan:   Hypertension, well-controlled Mild dyslipidemia.  Will recheck lipid profile at the time of his CPX in 6 months allergic rhinitis, stable  Medicines updated CPX 6 months

## 2016-01-15 NOTE — Progress Notes (Signed)
   Subjective:    Patient ID: Jay Robertson, male    DOB: 1956-05-03, 60 y.o.   MRN: [id number]  HPI  BP Readings from Last 3 Encounters:  01/15/16 128/90  07/04/15 130/90  01/31/15 130/88    Review of Systems     Objective:   Physical Exam        Assessment & Plan:

## 2016-01-15 NOTE — Progress Notes (Signed)
Pre visit review using our clinic review tool, if applicable. No additional management support is needed unless otherwise documented below in the visit note. 

## 2016-01-15 NOTE — Patient Instructions (Signed)
Limit your sodium (Salt) intake    It is important that you exercise regularly, at least 20 minutes 3 to 4 times per week.  If you develop chest pain or shortness of breath seek  medical attention.  Return in 6 months for follow-up  

## 2016-01-23 ENCOUNTER — Telehealth: Payer: Self-pay | Admitting: Internal Medicine

## 2016-01-23 MED ORDER — SULFAMETHOXAZOLE-TRIMETHOPRIM 800-160 MG PO TABS: 1.0000 | ORAL_TABLET | Freq: Two times a day (BID) | ORAL | Status: DC

## 2016-01-23 NOTE — Telephone Encounter (Signed)
Pt states the cellulitis has returned on knees and legs after finishing that last med Dr Raliegh Ip prescribed. It went away but has returned. Pt states he has used Cipro before and that helped. Pt has appt on Monday at 11:30 am. Would like to know if dr Raliegh Ip will call in Cipro . Even if dr Raliegh Ip does refill Cipro, pt still wants to keep his appointment on Monday,.  [facility] [address]

## 2016-01-23 NOTE — Telephone Encounter (Signed)
Spoke to pt, told him Rx for Bactrim DS sent to pharmacy, take one twice a day x 10 days. Also please keep appt for Monday. Pt verbalized understanding.

## 2016-01-23 NOTE — Telephone Encounter (Signed)
Please see message and advise if okay to refill Cipro?

## 2016-01-23 NOTE — Telephone Encounter (Signed)
Generic Bactrim DS No. 20 one twice a day

## 2016-01-27 ENCOUNTER — Ambulatory Visit (INDEPENDENT_AMBULATORY_CARE_PROVIDER_SITE_OTHER): Payer: Federal, State, Local not specified - PPO | Admitting: Internal Medicine

## 2016-01-27 ENCOUNTER — Encounter: Payer: Self-pay | Admitting: Internal Medicine

## 2016-01-27 VITALS — BP 110/78 | HR 80 | Temp 98.3°F | Resp 20 | Ht 71.0 in | Wt 177.0 lb

## 2016-01-27 DIAGNOSIS — L739 Follicular disorder, unspecified: Secondary | ICD-10-CM

## 2016-01-27 DIAGNOSIS — I10 Essential (primary) hypertension: Secondary | ICD-10-CM | POA: Diagnosis not present

## 2016-01-27 NOTE — Progress Notes (Signed)
   Subjective:    Patient ID: Jay Robertson, male    DOB: 09/13/1956, 60 y.o.   MRN: [id number]  HPI 60 year old patient who is seen today in follow-up.  Last week he complained of a lower extremity rash and was treated with Bactrim DS.  The rash has responding well.  He has treated hypertension which has been stable  Past Medical History  Diagnosis Date  . ALLERGIC RHINITIS 10/24/2007  . HYPERLIPIDEMIA 10/16/2010  . HYPERTENSION 07/25/2007    Social History   Social History  . Marital Status: Single    Spouse Name: N/A  . Number of Children: N/A  . Years of Education: N/A   Occupational History  . Not on file.   Social History Main Topics  . Smoking status: Never Smoker   . Smokeless tobacco: Never Used  . Alcohol Use: Yes  . Drug Use: No  . Sexual Activity: Not on file   Other Topics Concern  . Not on file   Social History Narrative    Past Surgical History  Procedure Laterality Date  . Appendectomy    . Nasal septum surgery      septal deviation, turbinate hypertrophy and obstruction  . Clavicle surgery      No family history on file.  Allergies  Allergen Reactions  . Doxycycline Other (See Comments)    Headaches    Current Outpatient Prescriptions on File Prior to Visit  Medication Sig Dispense Refill  . benazepril (LOTENSIN) 20 MG tablet TAKE 1 TABLET (20 MG TOTAL) BY MOUTH DAILY. 90 tablet 0  . fexofenadine (ALLEGRA) 180 MG tablet Take 180 mg by mouth daily.    Marland Kitchen ketoconazole (NIZORAL) 2 % shampoo APPLY AS DIRECTED TWICE WEEKLY 120 mL 0  . spironolactone (ALDACTONE) 25 MG tablet TAKE 1 TABLET (25 MG TOTAL) BY MOUTH DAILY. 90 tablet 0  . sulfamethoxazole-trimethoprim (BACTRIM DS,SEPTRA DS) 800-160 MG tablet Take 1 tablet by mouth 2 (two) times daily. 20 tablet 0  . tetrahydrozoline 0.05 % ophthalmic solution Place 1-2 drops into both eyes 2 (two) times daily as needed (for dry eyes and irritation).    . triamcinolone cream (KENALOG) 0.1 % APPLY 1  APPLICATION TOPICALLY 2 (TWO) TIMES DAILY. 30 g 2  . VIAGRA 50 MG tablet TAKE 1 TABLET AT BEDTIME AS NEEDED 11 tablet 2   No current facility-administered medications on file prior to visit.    BP 110/78 mmHg  Pulse 80  Temp(Src) 98.3 F (36.8 C) (Oral)  Resp 20  Ht 5\' 11"  (1.803 m)  Wt 177 lb (80.287 kg)  BMI 24.70 kg/m2  SpO2 98%      Review of Systems  Skin: Positive for rash.       Objective:   Physical Exam  Constitutional:  Blood pressure 110/78  Skin:  Scattered areas of folliculitis over the anterior lower legs          Assessment & Plan:   Folliculitis.  Patient is responding well to Bactrim.  Will complete antibiotic regimen Hypertension, stable  CPX as scheduled in one year

## 2016-01-27 NOTE — Patient Instructions (Signed)
Take your antibiotic as prescribed until ALL of it is gone, but stop if you develop a rash, swelling, or any side effects of the medication.  Contact our office as soon as possible if  there are side effects of the medication.  Call or return to clinic prn if these symptoms worsen or fail to improve as anticipated.  Limit your sodium (Salt) intake  Please check your blood pressure on a regular basis.  If it is consistently greater than 150/90, please make an office appointment.

## 2016-01-27 NOTE — Progress Notes (Signed)
Pre visit review using our clinic review tool, if applicable. No additional management support is needed unless otherwise documented below in the visit note. 

## 2016-03-02 ENCOUNTER — Other Ambulatory Visit: Payer: Self-pay | Admitting: Internal Medicine

## 2016-03-06 ENCOUNTER — Telehealth: Payer: Self-pay | Admitting: Internal Medicine

## 2016-03-06 NOTE — Telephone Encounter (Signed)
[facility] - Client [place] [facility]  Patient Name: Jay Robertson  DOB: 1956-08-09    Initial Comment Caller states he is running a fever. He wants to know if a stress test will help determine heart issues.   Nurse Assessment  Nurse: Justine Null, RN, Rodena Piety Date/Time (Eastern Time): 03/06/2016 4:30:40 PM  Confirm and document reason for call. If symptomatic, describe symptoms. You must click the next button to save text entered. ---Caller states he is running a fever. He wants to know if a stress test will help determine heart issues. caller stated that he has been having the fevers and has not taken his temperature and has no chest pain at present and has been having a runny nose with clear drainage for 3 to 4 days and has been cough with congestion and has decreased and has no difficulty breathing and has been able to eat and drink well  Has the patient traveled out of the country within the last 30 days? ---No  Does the patient have any new or worsening symptoms? ---Yes  Will a triage be completed? ---Yes  Related visit to physician within the last 2 weeks? ---No  Does the PT have any chronic conditions? (i.e. diabetes, asthma, etc.) ---Yes  List chronic conditions. ---HTN  Is this a behavioral health or substance abuse call? ---No     Guidelines    Guideline Title Affirmed Question Affirmed Notes  Common Cold Fever present > 3 days (72 hours)    Final Disposition User   See Physician within [place], RN, [facility]    Referrals  [facility]   Disagree/Comply: Comply

## 2016-03-06 NOTE — Telephone Encounter (Signed)
FYI

## 2016-04-08 ENCOUNTER — Other Ambulatory Visit: Payer: Self-pay | Admitting: Internal Medicine

## 2016-07-08 ENCOUNTER — Other Ambulatory Visit: Payer: Self-pay | Admitting: *Deleted

## 2016-07-08 MED ORDER — BENAZEPRIL HCL 20 MG PO TABS: ORAL_TABLET | ORAL | Status: DC

## 2016-07-08 MED ORDER — SPIRONOLACTONE 25 MG PO TABS: ORAL_TABLET | ORAL | Status: DC

## 2016-08-08 ENCOUNTER — Other Ambulatory Visit: Payer: Self-pay | Admitting: Internal Medicine

## 2016-09-02 ENCOUNTER — Encounter: Payer: Self-pay | Admitting: Internal Medicine

## 2016-09-02 ENCOUNTER — Ambulatory Visit (INDEPENDENT_AMBULATORY_CARE_PROVIDER_SITE_OTHER): Payer: Federal, State, Local not specified - PPO | Admitting: Internal Medicine

## 2016-09-02 VITALS — BP 126/90 | HR 65 | Temp 97.9°F | Resp 20 | Ht 71.0 in | Wt 176.0 lb

## 2016-09-02 DIAGNOSIS — E7800 Pure hypercholesterolemia, unspecified: Secondary | ICD-10-CM

## 2016-09-02 DIAGNOSIS — I10 Essential (primary) hypertension: Secondary | ICD-10-CM

## 2016-09-02 MED ORDER — SILDENAFIL CITRATE 20 MG PO TABS: ORAL_TABLET | ORAL | 4 refills | Status: DC

## 2016-09-02 NOTE — Patient Instructions (Signed)
Limit your sodium (Salt) intake  Please check your blood pressure on a regular basis.  If it is consistently greater than 150/90, please make an office appointment.    It is important that you exercise regularly, at least 20 minutes 3 to 4 times per week.  If you develop chest pain or shortness of breath seek  medical attention.  Return in 6 months for follow-up  

## 2016-09-02 NOTE — Progress Notes (Signed)
Pre visit review using our clinic review tool, if applicable. No additional management support is needed unless otherwise documented below in the visit note. 

## 2016-09-02 NOTE — Progress Notes (Signed)
   Subjective:    Patient ID: Jay Robertson, male    DOB: 20-Apr-1956, 60 y.o.   MRN: [id number]  HPI    Review of Systems     Objective:   Physical Exam        Assessment & Plan:

## 2016-09-02 NOTE — Progress Notes (Signed)
Subjective:    Patient ID: Jay Robertson, male    DOB: 1956-08-19, 60 y.o.   MRN: [id number]  HPI  60 year old seen today for follow-up of hypertension.  He has a history of ADD and uses Viagra No new concerns or complaints  Very little in the way of home blood pressure monitoring. No recent lab  Past Medical History:  Diagnosis Date  . ALLERGIC RHINITIS 10/24/2007  . HYPERLIPIDEMIA 10/16/2010  . HYPERTENSION 07/25/2007     Social History   Social History  . Marital status: Single    Spouse name: N/A  . Number of children: N/A  . Years of education: N/A   Occupational History  . Not on file.   Social History Main Topics  . Smoking status: Never Smoker  . Smokeless tobacco: Never Used  . Alcohol use Yes  . Drug use: No  . Sexual activity: Not on file   Other Topics Concern  . Not on file   Social History Narrative  . No narrative on file    Past Surgical History:  Procedure Laterality Date  . APPENDECTOMY    . CLAVICLE SURGERY    . NASAL SEPTUM SURGERY     septal deviation, turbinate hypertrophy and obstruction    No family history on file.  Allergies  Allergen Reactions  . Doxycycline Other (See Comments)    Headaches    Current Outpatient Prescriptions on File Prior to Visit  Medication Sig Dispense Refill  . benazepril (LOTENSIN) 20 MG tablet TAKE 1 TABLET (20 MG TOTAL) BY MOUTH DAILY. 90 tablet 0  . fexofenadine (ALLEGRA) 180 MG tablet Take 180 mg by mouth daily.    Marland Kitchen ketoconazole (NIZORAL) 2 % shampoo APPLY AS DIRECTED TWICE WEEKLY 120 mL 2  . spironolactone (ALDACTONE) 25 MG tablet TAKE 1 TABLET (25 MG TOTAL) BY MOUTH DAILY. 90 tablet 0  . tetrahydrozoline 0.05 % ophthalmic solution Place 1-2 drops into both eyes 2 (two) times daily as needed (for dry eyes and irritation).    . triamcinolone cream (KENALOG) 0.1 % APPLY 1 APPLICATION TOPICALLY 2 (TWO) TIMES DAILY. 30 g 2  . VIAGRA 50 MG tablet TAKE 1 TABLET BY MOUTH AT BEDTIME AS NEEDED 11 tablet  1   No current facility-administered medications on file prior to visit.     BP 126/90 (BP Location: Right Arm, Patient Position: Sitting, Cuff Size: Normal)   Pulse 65   Temp 97.9 F (36.6 C) (Oral)   Resp 20   Ht 5\' 11"  (1.803 m)   Wt 176 lb (79.8 kg)   SpO2 97%   BMI 24.55 kg/m     Review of Systems  Constitutional: Negative for appetite change, chills, fatigue and fever.  HENT: Negative for congestion, dental problem, ear pain, hearing loss, sore throat, tinnitus, trouble swallowing and voice change.   Eyes: Negative for pain, discharge and visual disturbance.  Respiratory: Negative for cough, chest tightness, wheezing and stridor.   Cardiovascular: Negative for chest pain, palpitations and leg swelling.  Gastrointestinal: Negative for abdominal distention, abdominal pain, blood in stool, constipation, diarrhea, nausea and vomiting.  Genitourinary: Negative for difficulty urinating, discharge, flank pain, genital sores, hematuria and urgency.  Musculoskeletal: Negative for arthralgias, back pain, gait problem, joint swelling, myalgias and neck stiffness.  Skin: Negative for rash.  Neurological: Negative for dizziness, syncope, speech difficulty, weakness, numbness and headaches.  Hematological: Negative for adenopathy. Does not bruise/bleed easily.  Psychiatric/Behavioral: Negative for behavioral problems and dysphoric mood. The patient  is not nervous/anxious.        Objective:   Physical Exam  Constitutional: He is oriented to person, place, and time. He appears well-developed.  Blood pressure 124/84  HENT:  Head: Normocephalic.  Right Ear: External ear normal.  Left Ear: External ear normal.  Eyes: Conjunctivae and EOM are normal.  Neck: Normal range of motion.  Cardiovascular: Normal rate and normal heart sounds.   Pulmonary/Chest: Breath sounds normal.  Abdominal: Bowel sounds are normal.  Musculoskeletal: Normal range of motion. He exhibits no edema or  tenderness.  Neurological: He is alert and oriented to person, place, and time.  Psychiatric: He has a normal mood and affect. His behavior is normal.          Assessment & Plan:   Essential hypertension, stable History of mild dyslipidemia Allergic rhinitis ED.  New prescription for sildenafil, dispensed  Home blood pressure monitoring.  Encouraged Low-salt diet recommended CPX 6 months  Nyoka Cowden

## 2016-10-06 ENCOUNTER — Other Ambulatory Visit: Payer: Self-pay | Admitting: Internal Medicine

## 2016-11-07 ENCOUNTER — Other Ambulatory Visit: Payer: Self-pay | Admitting: Internal Medicine

## 2016-11-18 ENCOUNTER — Telehealth: Payer: Self-pay | Admitting: Internal Medicine

## 2016-11-18 MED ORDER — AMOXICILLIN-POT CLAVULANATE 875-125 MG PO TABS: 1.0000 | ORAL_TABLET | Freq: Two times a day (BID) | ORAL | 0 refills | Status: DC

## 2016-11-18 NOTE — Telephone Encounter (Signed)
Please see message and advise 

## 2016-11-18 NOTE — Telephone Encounter (Signed)
Generic Augmentin 875 #14 one twice a day

## 2016-11-18 NOTE — Telephone Encounter (Signed)
° °  Pt call to say that he is having issues with a boil and Dr Raliegh Ip had called him in something before. He is asking if Dr Raliegh Ip will call that same med in for in    [place]

## 2017-01-06 ENCOUNTER — Telehealth: Payer: Self-pay | Admitting: Internal Medicine

## 2017-01-06 NOTE — Telephone Encounter (Signed)
See message below, Please advise 

## 2017-01-06 NOTE — Telephone Encounter (Signed)
Pt would like to see if Dr. Raliegh Ip would refill the antibiotic for the  Cellulitis that is on both legs pt state that he does not want to get an infection.  Pt has an appointment on 1/17 @ 10:30   Pharm:  [facility].

## 2017-01-08 NOTE — Telephone Encounter (Signed)
Ask  patient to report any clinical worsening.  Needs office visit to assess need for antibiotic therapy

## 2017-01-08 NOTE — Telephone Encounter (Signed)
Called pt and a detailed message on voicemail. Message stated that pt needs an office visit to assess need for antibiotic therapy. Asked pt to return call to schedule an appointment. [place] asked patient to report any clinical worsening.

## 2017-01-11 ENCOUNTER — Ambulatory Visit (INDEPENDENT_AMBULATORY_CARE_PROVIDER_SITE_OTHER): Payer: Federal, State, Local not specified - PPO | Admitting: Internal Medicine

## 2017-01-11 ENCOUNTER — Encounter: Payer: Self-pay | Admitting: Internal Medicine

## 2017-01-11 VITALS — BP 130/78 | HR 79 | Temp 98.1°F | Ht 71.0 in | Wt 178.8 lb

## 2017-01-11 DIAGNOSIS — E7800 Pure hypercholesterolemia, unspecified: Secondary | ICD-10-CM | POA: Diagnosis not present

## 2017-01-11 DIAGNOSIS — L739 Follicular disorder, unspecified: Secondary | ICD-10-CM | POA: Diagnosis not present

## 2017-01-11 DIAGNOSIS — I10 Essential (primary) hypertension: Secondary | ICD-10-CM

## 2017-01-11 DIAGNOSIS — J309 Allergic rhinitis, unspecified: Secondary | ICD-10-CM | POA: Diagnosis not present

## 2017-01-11 NOTE — Patient Instructions (Addendum)

## 2017-01-11 NOTE — Progress Notes (Signed)
Pre visit review using our clinic review tool, if applicable. No additional management support is needed unless otherwise documented below in the visit note. 

## 2017-01-11 NOTE — Progress Notes (Signed)
Subjective:    Patient ID: Jay Robertson, male    DOB: [birth year], 61 y.o.   MRN: [id number]  HPI  61 year old patient who has a history of essential hypertension and dyslipidemia. He is seen today with concerns about recurrent skin and soft tissue infections involving primarily his lower extremities. He has developed episodes of folliculitis that have responded to local therapy.  He states that he has had at least 1 episode of more diffuse cellulitis that has required oral antibiotics. At the present time.  He has had a small episode of resolving folliculitis involving his right lower anterior leg  Past Medical History:  Diagnosis Date  . ALLERGIC RHINITIS 10/24/2007  . HYPERLIPIDEMIA 10/16/2010  . HYPERTENSION 07/25/2007     Social History   Social History  . Marital status: Single    Spouse name: N/A  . Number of children: N/A  . Years of education: N/A   Occupational History  . Not on file.   Social History Main Topics  . Smoking status: Never Smoker  . Smokeless tobacco: Never Used  . Alcohol use Yes  . Drug use: No  . Sexual activity: Not on file   Other Topics Concern  . Not on file   Social History Narrative  . No narrative on file    Past Surgical History:  Procedure Laterality Date  . APPENDECTOMY    . CLAVICLE SURGERY    . NASAL SEPTUM SURGERY     septal deviation, turbinate hypertrophy and obstruction    No family history on file.  Allergies  Allergen Reactions  . Doxycycline Other (See Comments)    Headaches    Current Outpatient Prescriptions on File Prior to Visit  Medication Sig Dispense Refill  . benazepril (LOTENSIN) 20 MG tablet TAKE 1 TABLET (20 MG TOTAL) BY MOUTH DAILY. 90 tablet 3  . fexofenadine (ALLEGRA) 180 MG tablet Take 180 mg by mouth daily.    Marland Kitchen ketoconazole (NIZORAL) 2 % shampoo APPLY AS DIRECTED TWICE WEEKLY 120 mL 2  . sildenafil (REVATIO) 20 MG tablet 2 tablets daily as needed 30 tablet 4  . spironolactone (ALDACTONE) 25  MG tablet TAKE 1 TABLET BY MOUTH EVERY DAY 90 tablet 3  . triamcinolone cream (KENALOG) 0.1 % APPLY TO AFFECTED AREA TWICE A DAY 30 g 2  . VIAGRA 50 MG tablet TAKE 1 TABLET BY MOUTH AT BEDTIME AS NEEDED 11 tablet 1   No current facility-administered medications on file prior to visit.     BP 130/78 (BP Location: Right Arm, Patient Position: Sitting, Cuff Size: Normal)   Pulse 79   Temp 98.1 F (36.7 C) (Oral)   Ht 5\' 11"  (1.803 m)   Wt 178 lb 12.8 oz (81.1 kg)   SpO2 98%   BMI 24.94 kg/m     Review of Systems  Constitutional: Negative for appetite change, chills, fatigue and fever.  HENT: Negative for congestion, dental problem, ear pain, hearing loss, sore throat, tinnitus, trouble swallowing and voice change.   Eyes: Negative for pain, discharge and visual disturbance.  Respiratory: Negative for cough, chest tightness, wheezing and stridor.   Cardiovascular: Negative for chest pain, palpitations and leg swelling.  Gastrointestinal: Negative for abdominal distention, abdominal pain, blood in stool, constipation, diarrhea, nausea and vomiting.  Genitourinary: Negative for difficulty urinating, discharge, flank pain, genital sores, hematuria and urgency.  Musculoskeletal: Negative for arthralgias, back pain, gait problem, joint swelling, myalgias and neck stiffness.  Skin: Positive for rash and wound.  Neurological: Negative for dizziness, syncope, speech difficulty, weakness, numbness and headaches.  Hematological: Negative for adenopathy. Does not bruise/bleed easily.  Psychiatric/Behavioral: Negative for behavioral problems and dysphoric mood. The patient is not nervous/anxious.        Objective:   Physical Exam  Constitutional: He is oriented to person, place, and time. He appears well-developed.  Blood pressure 126/76  HENT:  Head: Normocephalic.  Right Ear: External ear normal.  Left Ear: External ear normal.  Eyes: Conjunctivae and EOM are normal.  Neck: Normal range  of motion.  Cardiovascular: Normal rate and normal heart sounds.   Pulmonary/Chest: Breath sounds normal.  Abdominal: Bowel sounds are normal.  Musculoskeletal: Normal range of motion. He exhibits no edema or tenderness.  Neurological: He is alert and oriented to person, place, and time.  Skin:  3.  Small areas of resolving folliculitis involving the anterior lower legs  Psychiatric: He has a normal mood and affect. His behavior is normal.          Assessment & Plan:   Recurrent folliculitis.  This has been managed well with warm compresses and topical therapy.  Indications for oral antibiotics.  Discussed.  He was given patient information concerning folliculitis. Essential hypertension, well-controlled  CPX as scheduled  Nyoka Cowden

## 2017-01-13 ENCOUNTER — Ambulatory Visit: Payer: Federal, State, Local not specified - PPO | Admitting: Internal Medicine

## 2017-01-17 ENCOUNTER — Other Ambulatory Visit: Payer: Self-pay | Admitting: Internal Medicine

## 2017-07-05 ENCOUNTER — Other Ambulatory Visit: Payer: Self-pay | Admitting: Internal Medicine

## 2017-09-08 ENCOUNTER — Ambulatory Visit (INDEPENDENT_AMBULATORY_CARE_PROVIDER_SITE_OTHER): Payer: Federal, State, Local not specified - PPO | Admitting: Internal Medicine

## 2017-09-08 ENCOUNTER — Encounter: Payer: Self-pay | Admitting: Internal Medicine

## 2017-09-08 VITALS — BP 128/70 | HR 96 | Temp 97.9°F | Ht 71.0 in | Wt 179.2 lb

## 2017-09-08 DIAGNOSIS — J302 Other seasonal allergic rhinitis: Secondary | ICD-10-CM

## 2017-09-08 DIAGNOSIS — I10 Essential (primary) hypertension: Secondary | ICD-10-CM | POA: Diagnosis not present

## 2017-09-08 MED ORDER — TRIAMCINOLONE ACETONIDE 0.1 % EX CREA: TOPICAL_CREAM | CUTANEOUS | 2 refills | Status: DC

## 2017-09-08 NOTE — Patient Instructions (Signed)
Limit your sodium (Salt) intake  Please check your blood pressure on a regular basis.  If it is consistently greater than 150/90, please make an office appointment.  Return in 6 months for follow-up   

## 2017-09-08 NOTE — Progress Notes (Signed)
Subjective:    Patient ID: Jay Robertson, male    DOB: Sep 28, 1956, 61 y.o.   MRN: [id number]  HPI  61 year old patient who has essential hypertension.  He has allergic rhinitis and dyslipidemia. He continues to do well.  Blood pressure readings at home have been well controlled.  No concerns or complaints today  Past Medical History:  Diagnosis Date  . ALLERGIC RHINITIS 10/24/2007  . HYPERLIPIDEMIA 10/16/2010  . HYPERTENSION 07/25/2007     Social History   Social History  . Marital status: Single    Spouse name: N/A  . Number of children: N/A  . Years of education: N/A   Occupational History  . Not on file.   Social History Main Topics  . Smoking status: Never Smoker  . Smokeless tobacco: Never Used  . Alcohol use Yes  . Drug use: No  . Sexual activity: Not on file   Other Topics Concern  . Not on file   Social History Narrative  . No narrative on file    Past Surgical History:  Procedure Laterality Date  . APPENDECTOMY    . CLAVICLE SURGERY    . NASAL SEPTUM SURGERY     septal deviation, turbinate hypertrophy and obstruction    No family history on file.  Allergies  Allergen Reactions  . Doxycycline Other (See Comments)    Headaches    Current Outpatient Prescriptions on File Prior to Visit  Medication Sig Dispense Refill  . benazepril (LOTENSIN) 20 MG tablet TAKE 1 TABLET (20 MG TOTAL) BY MOUTH DAILY. 90 tablet 3  . fexofenadine (ALLEGRA) 180 MG tablet Take 180 mg by mouth daily.    Marland Kitchen ketoconazole (NIZORAL) 2 % shampoo APPLY AS DIRECTED TWICE WEEKLY 120 mL 2  . sildenafil (REVATIO) 20 MG tablet 2 tablets daily as needed 30 tablet 4  . spironolactone (ALDACTONE) 25 MG tablet TAKE 1 TABLET BY MOUTH EVERY DAY 90 tablet 3  . triamcinolone cream (KENALOG) 0.1 % APPLY TO AFFECTED AREA TWICE A DAY 30 g 2  . VIAGRA 50 MG tablet TAKE 1 TABLET BY MOUTH AT BEDTIME AS NEEDED 11 tablet 1   No current facility-administered medications on file prior to visit.      BP 128/70 (BP Location: Left Arm, Patient Position: Sitting, Cuff Size: Normal)   Pulse 96   Temp 97.9 F (36.6 C) (Oral)   Ht 5\' 11"  (1.803 m)   Wt 179 lb 3.2 oz (81.3 kg)   SpO2 99%   BMI 24.99 kg/m    Review of Systems  Constitutional: Negative for appetite change, chills, fatigue and fever.  HENT: Negative for congestion, dental problem, ear pain, hearing loss, sore throat, tinnitus, trouble swallowing and voice change.   Eyes: Negative for pain, discharge and visual disturbance.  Respiratory: Negative for cough, chest tightness, wheezing and stridor.   Cardiovascular: Negative for chest pain, palpitations and leg swelling.  Gastrointestinal: Negative for abdominal distention, abdominal pain, blood in stool, constipation, diarrhea, nausea and vomiting.  Genitourinary: Negative for difficulty urinating, discharge, flank pain, genital sores, hematuria and urgency.  Musculoskeletal: Negative for arthralgias, back pain, gait problem, joint swelling, myalgias and neck stiffness.  Skin: Negative for rash.  Neurological: Negative for dizziness, syncope, speech difficulty, weakness, numbness and headaches.  Hematological: Negative for adenopathy. Does not bruise/bleed easily.  Psychiatric/Behavioral: Negative for behavioral problems and dysphoric mood. The patient is not nervous/anxious.        Objective:   Physical Exam  Constitutional: He is  oriented to person, place, and time. He appears well-developed.  HENT:  Head: Normocephalic.  Right Ear: External ear normal.  Left Ear: External ear normal.  Eyes: Conjunctivae and EOM are normal.  Neck: Normal range of motion.  Cardiovascular: Normal rate and normal heart sounds.   Pulmonary/Chest: Breath sounds normal.  Abdominal: Bowel sounds are normal.  Musculoskeletal: Normal range of motion. He exhibits no edema or tenderness.  Neurological: He is alert and oriented to person, place, and time.  Psychiatric: He has a normal  mood and affect. His behavior is normal.          Assessment & Plan:    Hypertension, well-controlled Allergic rhinitis, stable History dyslipidemia  No change in medical regimen Schedule CPX  [id number],PETER Pilar Plate

## 2017-09-29 ENCOUNTER — Other Ambulatory Visit: Payer: Self-pay | Admitting: Internal Medicine

## 2018-05-12 ENCOUNTER — Encounter: Payer: Self-pay | Admitting: Internal Medicine

## 2018-05-12 ENCOUNTER — Ambulatory Visit: Payer: Federal, State, Local not specified - PPO | Admitting: Internal Medicine

## 2018-06-22 ENCOUNTER — Ambulatory Visit: Payer: Federal, State, Local not specified - PPO | Admitting: Internal Medicine

## 2018-06-22 ENCOUNTER — Encounter: Payer: Self-pay | Admitting: Internal Medicine

## 2018-06-22 VITALS — BP 118/64 | HR 66 | Temp 98.0°F | Wt 181.0 lb

## 2018-06-22 DIAGNOSIS — J301 Allergic rhinitis due to pollen: Secondary | ICD-10-CM

## 2018-06-22 DIAGNOSIS — Z0000 Encounter for general adult medical examination without abnormal findings: Secondary | ICD-10-CM | POA: Diagnosis not present

## 2018-06-22 DIAGNOSIS — I10 Essential (primary) hypertension: Secondary | ICD-10-CM

## 2018-06-22 DIAGNOSIS — E7800 Pure hypercholesterolemia, unspecified: Secondary | ICD-10-CM | POA: Diagnosis not present

## 2018-06-22 MED ORDER — SPIRONOLACTONE 25 MG PO TABS: 25.0000 mg | ORAL_TABLET | Freq: Every day | ORAL | 3 refills | Status: DC

## 2018-06-22 MED ORDER — BENAZEPRIL HCL 20 MG PO TABS: ORAL_TABLET | ORAL | 3 refills | Status: DC

## 2018-06-22 MED ORDER — TRIAMCINOLONE ACETONIDE 0.1 % EX CREA
TOPICAL_CREAM | CUTANEOUS | 2 refills | Status: DC
Start: 2018-06-22 — End: 2019-01-13

## 2018-06-22 NOTE — Patient Instructions (Signed)
Limit your sodium (Salt) intake  Please check your blood pressure on a regular basis.  If it is consistently greater than 130/85, please make an office appointment.  Return in 6 months for follow-up

## 2018-06-22 NOTE — Progress Notes (Signed)
Subjective:    Patient ID: Jay Robertson, male    DOB: Nov 04, 1956, 62 y.o.   MRN: [id number]  HPI  62 year old patient who is seen today for follow-up.  He has a history of essential hypertension which has been controlled on Lotensin as well as Spironolactone.  He has a history of allergic rhinitis which has been stable. He requests completion for FMLA paperwork although he will be retiring at the end of next month. He is doing quite well without other concerns or complaints  Past Medical History:  Diagnosis Date  . ALLERGIC RHINITIS 10/24/2007  . HYPERLIPIDEMIA 10/16/2010  . HYPERTENSION 07/25/2007     Social History   Socioeconomic History  . Marital status: Single    Spouse name: Not on file  . Number of children: Not on file  . Years of education: Not on file  . Highest education level: Not on file  Occupational History  . Not on file  Social Needs  . Financial resource strain: Not on file  . Food insecurity:    Worry: Not on file    Inability: Not on file  . Transportation needs:    Medical: Not on file    Non-medical: Not on file  Tobacco Use  . Smoking status: Never Smoker  . Smokeless tobacco: Never Used  Substance and Sexual Activity  . Alcohol use: Yes  . Drug use: No  . Sexual activity: Not on file  Lifestyle  . Physical activity:    Days per week: Not on file    Minutes per session: Not on file  . Stress: Not on file  Relationships  . Social connections:    Talks on phone: Not on file    Gets together: Not on file    Attends religious service: Not on file    Active member of club or organization: Not on file    Attends meetings of clubs or organizations: Not on file    Relationship status: Not on file  . Intimate partner violence:    Fear of current or ex partner: Not on file    Emotionally abused: Not on file    Physically abused: Not on file    Forced sexual activity: Not on file  Other Topics Concern  . Not on file  Social History Narrative    . Not on file    Past Surgical History:  Procedure Laterality Date  . APPENDECTOMY    . CLAVICLE SURGERY    . NASAL SEPTUM SURGERY     septal deviation, turbinate hypertrophy and obstruction    History reviewed. No pertinent family history.  Allergies  Allergen Reactions  . Doxycycline Other (See Comments)    Headaches    Current Outpatient Medications on File Prior to Visit  Medication Sig Dispense Refill  . fexofenadine (ALLEGRA) 180 MG tablet Take 180 mg by mouth daily.    Marland Kitchen ketoconazole (NIZORAL) 2 % shampoo APPLY AS DIRECTED TWICE WEEKLY 120 mL 2  . sildenafil (REVATIO) 20 MG tablet 2 tablets daily as needed 30 tablet 4  . VIAGRA 50 MG tablet TAKE 1 TABLET BY MOUTH AT BEDTIME AS NEEDED 11 tablet 1   No current facility-administered medications on file prior to visit.     BP 118/64 (BP Location: Right Arm, Patient Position: Sitting, Cuff Size: Large)   Pulse 66   Temp 98 F (36.7 C) (Oral)   Wt 181 lb (82.1 kg)   SpO2 96%   BMI 25.24 kg/m  Review of Systems  Constitutional: Negative for appetite change, chills, fatigue and fever.  HENT: Negative for congestion, dental problem, ear pain, hearing loss, sore throat, tinnitus, trouble swallowing and voice change.   Eyes: Negative for pain, discharge and visual disturbance.  Respiratory: Negative for cough, chest tightness, wheezing and stridor.   Cardiovascular: Negative for chest pain, palpitations and leg swelling.  Gastrointestinal: Negative for abdominal distention, abdominal pain, blood in stool, constipation, diarrhea, nausea and vomiting.  Genitourinary: Negative for difficulty urinating, discharge, flank pain, genital sores, hematuria and urgency.  Musculoskeletal: Negative for arthralgias, back pain, gait problem, joint swelling, myalgias and neck stiffness.  Skin: Negative for rash.  Neurological: Positive for dizziness and light-headedness. Negative for syncope, speech difficulty, weakness, numbness  and headaches.  Hematological: Negative for adenopathy. Does not bruise/bleed easily.  Psychiatric/Behavioral: Negative for behavioral problems and dysphoric mood. The patient is not nervous/anxious.        Objective:   Physical Exam  Constitutional: He is oriented to person, place, and time. He appears well-developed. No distress.  Blood pressure 100/70  HENT:  Head: Normocephalic.  Right Ear: External ear normal.  Left Ear: External ear normal.  Eyes: Conjunctivae and EOM are normal.  Neck: Normal range of motion.  Cardiovascular: Normal rate and normal heart sounds.  Pulmonary/Chest: Breath sounds normal.  Abdominal: Bowel sounds are normal.  Musculoskeletal: Normal range of motion. He exhibits no edema or tenderness.  Neurological: He is alert and oriented to person, place, and time.  Psychiatric: He has a normal mood and affect. His behavior is normal.          Assessment & Plan:  Essential hypertension.  Blood pressure low normal today We will challenge off Spironolactone.  Continue home blood pressure monitoring.  Patient has been asked to resume diuretic therapy if blood pressures consistently greater than 130/85 Allergic rhinitis  FMLA paperwork completed Patient has been asked to return in 6 months for annual exam and to establish with a new physician Preventive health.  Laboratory update will be reviewed  Marletta Lor

## 2018-07-05 ENCOUNTER — Ambulatory Visit: Payer: Federal, State, Local not specified - PPO | Admitting: Internal Medicine

## 2018-07-05 ENCOUNTER — Ambulatory Visit: Payer: Self-pay

## 2018-07-05 ENCOUNTER — Encounter: Payer: Self-pay | Admitting: Internal Medicine

## 2018-07-05 VITALS — BP 110/80 | HR 62 | Temp 98.2°F | Wt 182.0 lb

## 2018-07-05 DIAGNOSIS — I10 Essential (primary) hypertension: Secondary | ICD-10-CM

## 2018-07-05 DIAGNOSIS — L723 Sebaceous cyst: Secondary | ICD-10-CM

## 2018-07-05 MED ORDER — SULFAMETHOXAZOLE-TRIMETHOPRIM 800-160 MG PO TABS: 1.0000 | ORAL_TABLET | Freq: Two times a day (BID) | ORAL | 0 refills | Status: DC

## 2018-07-05 NOTE — Patient Instructions (Signed)
Skin Abscess A skin abscess is an infected area on or under your skin that contains pus and other material. An abscess can happen almost anywhere on your body. Some abscesses break open (rupture) on their own. Most continue to get worse unless they are treated. The infection can spread deeper into the body and into your blood, which can make you feel sick. Treatment usually involves draining the abscess. Follow these instructions at home: Abscess Care  If you have an abscess that has not drained, place a warm, clean, wet washcloth over the abscess several times a day. Do this as told by your doctor.  Follow instructions from your doctor about how to take care of your abscess. Make sure you: ? Cover the abscess with a bandage (dressing). ? Change your bandage or gauze as told by your doctor. ? Wash your hands with soap and water before you change the bandage or gauze. If you cannot use soap and water, use hand sanitizer.  Check your abscess every day for signs that the infection is getting worse. Check for: ? More redness, swelling, or pain. ? More fluid or blood. ? Warmth. ? More pus or a bad smell. Medicines   Take over-the-counter and prescription medicines only as told by your doctor.  If you were prescribed an antibiotic medicine, take it as told by your doctor. Do not stop taking the antibiotic even if you start to feel better. General instructions  To avoid spreading the infection: ? Do not share personal care items, towels, or hot tubs with others. ? Avoid making skin-to-skin contact with other people.  Keep all follow-up visits as told by your doctor. This is important. Contact a doctor if:  You have more redness, swelling, or pain around your abscess.  You have more fluid or blood coming from your abscess.  Your abscess feels warm when you touch it.  You have more pus or a bad smell coming from your abscess.  You have a fever.  Your muscles ache.  You have  chills.  You feel sick. Get help right away if:  You have very bad (severe) pain.  You see red streaks on your skin spreading away from the abscess. This information is not intended to replace advice given to you by your health care provider. Make sure you discuss any questions you have with your health care provider. Document Released: 06/01/2008 Document Revised: 08/09/2016 Document Reviewed: 10/23/2015 Elsevier Interactive Patient Education  2018 [employer]  

## 2018-07-05 NOTE — Progress Notes (Signed)
Subjective:    Patient ID: Jay Robertson, male    DOB: 01-23-1956, 62 y.o.   MRN: [id number]  HPI  62 year old patient who presents with a chief complaint of a soft tissue infection involving the outer left lower arm.  He noted an inflammatory nodule yesterday that spontaneously drained.  His girlfriend had doxycycline on hand and he took an initial dose last night and another dose this morning.  He states that doxycycline traditionally causes headaches and he feels that these 2 dosages have resulted in a mild headache today.  The arm has greatly improved  He has a history of essential hypertension.  Diuretic therapy was held last month.  He continues to monitor home blood pressure readings with nice results  Past Medical History:  Diagnosis Date  . ALLERGIC RHINITIS 10/24/2007  . HYPERLIPIDEMIA 10/16/2010  . HYPERTENSION 07/25/2007     Social History   Socioeconomic History  . Marital status: Single    Spouse name: Not on file  . Number of children: Not on file  . Years of education: Not on file  . Highest education level: Not on file  Occupational History  . Not on file  Social Needs  . Financial resource strain: Not on file  . Food insecurity:    Worry: Not on file    Inability: Not on file  . Transportation needs:    Medical: Not on file    Non-medical: Not on file  Tobacco Use  . Smoking status: Never Smoker  . Smokeless tobacco: Never Used  Substance and Sexual Activity  . Alcohol use: Yes  . Drug use: No  . Sexual activity: Not on file  Lifestyle  . Physical activity:    Days per week: Not on file    Minutes per session: Not on file  . Stress: Not on file  Relationships  . Social connections:    Talks on phone: Not on file    Gets together: Not on file    Attends religious service: Not on file    Active member of club or organization: Not on file    Attends meetings of clubs or organizations: Not on file    Relationship status: Not on file  . Intimate  partner violence:    Fear of current or ex partner: Not on file    Emotionally abused: Not on file    Physically abused: Not on file    Forced sexual activity: Not on file  Other Topics Concern  . Not on file  Social History Narrative  . Not on file    Past Surgical History:  Procedure Laterality Date  . APPENDECTOMY    . CLAVICLE SURGERY    . NASAL SEPTUM SURGERY     septal deviation, turbinate hypertrophy and obstruction    History reviewed. No pertinent family history.  Allergies  Allergen Reactions  . Doxycycline Other (See Comments)    Headaches    Current Outpatient Medications on File Prior to Visit  Medication Sig Dispense Refill  . benazepril (LOTENSIN) 20 MG tablet TAKE 1 TABLET (20 MG TOTAL) BY MOUTH DAILY. 90 tablet 3  . fexofenadine (ALLEGRA) 180 MG tablet Take 180 mg by mouth daily.    Marland Kitchen ketoconazole (NIZORAL) 2 % shampoo APPLY AS DIRECTED TWICE WEEKLY 120 mL 2  . sildenafil (REVATIO) 20 MG tablet 2 tablets daily as needed 30 tablet 4  . spironolactone (ALDACTONE) 25 MG tablet Take 1 tablet (25 mg total) by mouth daily. [place]  tablet 3  . triamcinolone cream (KENALOG) 0.1 % APPLY TO AFFECTED AREA TWICE A DAY 30 g 2  . VIAGRA 50 MG tablet TAKE 1 TABLET BY MOUTH AT BEDTIME AS NEEDED 11 tablet 1   No current facility-administered medications on file prior to visit.     BP 110/80 (BP Location: Right Arm, Patient Position: Sitting, Cuff Size: Normal)   Pulse 62   Temp 98.2 F (36.8 C) (Oral)   Wt 182 lb (82.6 kg)   SpO2 98%   BMI 25.38 kg/m      Review of Systems  Constitutional: Negative for appetite change, chills, fatigue and fever.  HENT: Negative for congestion, dental problem, ear pain, hearing loss, sore throat, tinnitus, trouble swallowing and voice change.   Eyes: Negative for pain, discharge and visual disturbance.  Respiratory: Negative for cough, chest tightness, wheezing and stridor.   Cardiovascular: Negative for chest pain, palpitations and  leg swelling.  Gastrointestinal: Negative for abdominal distention, abdominal pain, blood in stool, constipation, diarrhea, nausea and vomiting.  Genitourinary: Negative for difficulty urinating, discharge, flank pain, genital sores, hematuria and urgency.  Musculoskeletal: Negative for arthralgias, back pain, gait problem, joint swelling, myalgias and neck stiffness.  Skin: Positive for wound. Negative for rash.  Neurological: Negative for dizziness, syncope, speech difficulty, weakness, numbness and headaches.  Hematological: Negative for adenopathy. Does not bruise/bleed easily.  Psychiatric/Behavioral: Negative for behavioral problems and dysphoric mood. The patient is not nervous/anxious.        Objective:   Physical Exam  Constitutional: He appears well-developed and well-nourished. No distress.  Blood pressure well controlled  Skin:  A 2 x 3 cm nodule present involving the outer aspect of the left lower arm.  A small central crust noted No significant erythema tenderness No fluctuance           Assessment & Plan:   Inflamed sebaceous cyst that has spontaneously drained. Antibiotic therapy will be held The patient was given a prescription for Septra DS since he does not tolerate doxycycline well.  This prescription was printed.  The patient will get filled in the unlikely event that he has any further swelling pain and drainage from the sebaceous cyst.  He will call the office if he has any questions concerning the need for antibiotic therapy Patient information dispensed   Essential hypertension.  Remains stable off diuretic therapy.  We will continue to monitor.  Patient will follow up with a new provider in 6 months  Marletta Lor

## 2018-07-05 NOTE — Telephone Encounter (Signed)
Pt. Reports he has "a boil" to left arm - lower arm . Approxiamately 3" diameter. States he has been putting alcohol on it, but it is not getting better. Appointment made for today.  Reason for Disposition . Boil > 2 inches across (> 5 cm; larger than a golf ball or ping pong ball)  Answer Assessment - Initial Assessment Questions 1. APPEARANCE of BOIL: "What does the boil look like?"      3 inches 2. LOCATION: "Where is the boil located?"      Left arm - lower arm on the back 3. NUMBER: "How many boils are there?"      1 4. SIZE: "How big is the boil?" (e.g., inches, cm; compare to size of a coin or other object)     3 inches 5. ONSET: "When did the boil start?"     3 days ago 6. PAIN: "Is there any pain?" If so, ask: "How bad is the pain?"   (Scale 1-10; or mild, moderate, severe)     Mild 7. FEVER: "Do you have a fever?" If so, ask: "What is it, how was it measured, and when did it start?"      No 8. SOURCE: "Have you been around anyone with boils or other Staph infections?" "Have you ever had boils before?"     No 9. OTHER SYMPTOMS: "Do you have any other symptoms?" (e.g., shaking chills, weakness, rash elsewhere on body)     No 10. PREGNANCY: "Is there any chance you are pregnant?" "When was your last menstrual period?"       n/a  Protocols used: BOIL (SKIN ABSCESS)-A-AH

## 2018-07-20 ENCOUNTER — Ambulatory Visit: Payer: Federal, State, Local not specified - PPO | Admitting: Internal Medicine

## 2018-07-20 ENCOUNTER — Encounter: Payer: Self-pay | Admitting: Internal Medicine

## 2018-07-20 VITALS — BP 142/82 | HR 87 | Temp 98.5°F | Wt 180.8 lb

## 2018-07-20 DIAGNOSIS — K0510 Chronic gingivitis, plaque induced: Secondary | ICD-10-CM

## 2018-07-20 MED ORDER — PENICILLIN V POTASSIUM 500 MG PO TABS: 500.0000 mg | ORAL_TABLET | Freq: Four times a day (QID) | ORAL | 0 refills | Status: DC

## 2018-07-20 MED ORDER — HYDROCODONE-ACETAMINOPHEN 5-325 MG PO TABS: 1.0000 | ORAL_TABLET | Freq: Four times a day (QID) | ORAL | 0 refills | Status: AC | PRN

## 2018-07-20 NOTE — Patient Instructions (Addendum)
Follow-up with your dentist   Gingivitis Gingivitis is redness, soreness, and swelling (inflammation) of the gums. It results from poor care and cleaning of the mouth and teeth (oral hygiene). This condition is usually mild and clears up with treatment. Without treatment and proper oral hygiene, gingivitis can get worse and lead to other problems with the teeth and gums. What are the causes? This condition is usually caused by a buildup of a sticky substance called plaque. Plaque is made up of mucus, bacteria, and food particles. When plaque builds up, it reacts with the saliva in the mouth to form a hard deposit called tartar, which becomes trapped around the base of the tooth. Plaque and tartar cause irritation of the gums, and that leads to gingivitis. What increases the risk? The following factors may make you more likely to develop this condition:  Not practicing good oral hygiene.  Not getting proper nutrition from your diet.  Taking certain medicines.  Pregnancy.  Using tobacco products.  Certain medical conditions, such as: ? Diabetes. ? Some viral or fungal infections. ? Dry mouth.  Dental appliances that do not fit properly.  What are the signs or symptoms? Symptoms of this condition include:  Gums that bleed easily, especially during flossing or brushing.  Swollen gums.  Gums that are bright red or purple.  Receding gums. This means that the gums are wearing away from the teeth so that more of the tooth is exposed.  Bad breath.  How is this diagnosed? This condition is diagnosed with a medical history and a physical exam of the teeth and gums. X-rays may be taken to see if the inflammation has spread to the supporting structures of the teeth. How is this treated? Treatment for this condition includes:  Having your teeth and gums cleaned at the dentist's office to have the plaque and tartar removed.  Practicing good oral hygiene at home. This includes careful  brushing and regular flossing. In some cases, a mouthwash may be prescribed or recommended.  Follow these instructions at home:  Follow instructions from your dentist about how to clean your teeth. Make sure you: ? Brush your teeth two times per day using a soft-bristled toothbrush. ? Floss at least one time per day. It is best to floss before you brush your teeth. ? Use a mouthwash as told by your dentist.  Maintain a well-balanced diet. Between meals, limit your intake of foods and beverages that contain sugar.  See a dentist on a regular basis for cleaning and checkups.  Do not use tobacco products, including cigarettes, chewing tobacco, or e-cigarettes. If you need help quitting, ask your health care provider.  Keep all follow-up visits as told by your dentist. This is important. Contact a health care provider if:  You have a fever.  You have a lot of bleeding from your gums.  You have pain in your gums or teeth.  You have difficulty chewing.  You notice any loose or infected teeth.  You have swollen glands in your face or neck. This information is not intended to replace advice given to you by your health care provider. Make sure you discuss any questions you have with your health care provider. Document Released: 06/09/2001 Document Revised: 05/21/2016 Document Reviewed: 07/29/2015 Elsevier Interactive Patient Education  [employer].

## 2018-07-20 NOTE — Progress Notes (Signed)
Subjective:    Patient ID: Jay Robertson, male    DOB: 1956-03-28, 62 y.o.   MRN: [id number]  HPI Patient presents with a 2-day history of right facial swelling.  No fever or other constitutional complaints.  He has noticed some tenderness along the gumline of the right upper lateral gingival area.  Past Medical History:  Diagnosis Date  . ALLERGIC RHINITIS 10/24/2007  . HYPERLIPIDEMIA 10/16/2010  . HYPERTENSION 07/25/2007     Social History   Socioeconomic History  . Marital status: Single    Spouse name: Not on file  . Number of children: Not on file  . Years of education: Not on file  . Highest education level: Not on file  Occupational History  . Not on file  Social Needs  . Financial resource strain: Not on file  . Food insecurity:    Worry: Not on file    Inability: Not on file  . Transportation needs:    Medical: Not on file    Non-medical: Not on file  Tobacco Use  . Smoking status: Never Smoker  . Smokeless tobacco: Never Used  Substance and Sexual Activity  . Alcohol use: Yes  . Drug use: No  . Sexual activity: Not on file  Lifestyle  . Physical activity:    Days per week: Not on file    Minutes per session: Not on file  . Stress: Not on file  Relationships  . Social connections:    Talks on phone: Not on file    Gets together: Not on file    Attends religious service: Not on file    Active member of club or organization: Not on file    Attends meetings of clubs or organizations: Not on file    Relationship status: Not on file  . Intimate partner violence:    Fear of current or ex partner: Not on file    Emotionally abused: Not on file    Physically abused: Not on file    Forced sexual activity: Not on file  Other Topics Concern  . Not on file  Social History Narrative  . Not on file    Past Surgical History:  Procedure Laterality Date  . APPENDECTOMY    . CLAVICLE SURGERY    . NASAL SEPTUM SURGERY     septal deviation, turbinate hypertrophy  and obstruction    No family history on file.  Allergies  Allergen Reactions  . Doxycycline Other (See Comments)    Headaches    Current Outpatient Medications on File Prior to Visit  Medication Sig Dispense Refill  . benazepril (LOTENSIN) 20 MG tablet TAKE 1 TABLET (20 MG TOTAL) BY MOUTH DAILY. 90 tablet 3  . fexofenadine (ALLEGRA) 180 MG tablet Take 180 mg by mouth daily.    Marland Kitchen ketoconazole (NIZORAL) 2 % shampoo APPLY AS DIRECTED TWICE WEEKLY 120 mL 2  . sildenafil (REVATIO) 20 MG tablet 2 tablets daily as needed 30 tablet 4  . spironolactone (ALDACTONE) 25 MG tablet Take 1 tablet (25 mg total) by mouth daily. 90 tablet 3  . sulfamethoxazole-trimethoprim (BACTRIM DS,SEPTRA DS) 800-160 MG tablet Take 1 tablet by mouth 2 (two) times daily. 14 tablet 0  . triamcinolone cream (KENALOG) 0.1 % APPLY TO AFFECTED AREA TWICE A DAY 30 g 2  . VIAGRA 50 MG tablet TAKE 1 TABLET BY MOUTH AT BEDTIME AS NEEDED 11 tablet 1   No current facility-administered medications on file prior to visit.  BP (!) 142/82 (BP Location: Left Arm, Patient Position: Sitting, Cuff Size: Normal)   Pulse 87   Temp 98.5 F (36.9 C) (Oral)   Wt 180 lb 12.8 oz (82 kg)   SpO2 96%   BMI 25.22 kg/m      Review of Systems  Constitutional: Negative.   HENT: Positive for dental problem and facial swelling.        Objective:   Physical Exam  Constitutional: He appears well-developed and well-nourished. No distress.  HENT:  Right facial swelling in the [place]  Region Gingivitis noticed most prominent involving the buccal side of the right upper premolar area.  Considerable receding of the gum noted at this point as well          Assessment & Plan:   Gingivitis.  Will treat with Pen-Vee K.  Patient information discussed and dispensed.  He will follow-up with his dentist within the next 1 or 2 days  Marletta Lor

## 2018-09-06 ENCOUNTER — Ambulatory Visit: Payer: Federal, State, Local not specified - PPO | Admitting: Internal Medicine

## 2018-09-29 ENCOUNTER — Encounter: Payer: Self-pay | Admitting: Family Medicine

## 2018-09-29 ENCOUNTER — Ambulatory Visit: Payer: Federal, State, Local not specified - PPO | Admitting: Family Medicine

## 2018-09-29 VITALS — BP 128/90 | HR 76 | Temp 98.1°F | Wt 182.0 lb

## 2018-09-29 DIAGNOSIS — R0683 Snoring: Secondary | ICD-10-CM | POA: Diagnosis not present

## 2018-09-29 DIAGNOSIS — I10 Essential (primary) hypertension: Secondary | ICD-10-CM | POA: Diagnosis not present

## 2018-09-29 NOTE — Patient Instructions (Signed)
How to Take Your Blood Pressure You can take your blood pressure at home with a machine. You may need to check your blood pressure at home:  To check if you have high blood pressure (hypertension).  To check your blood pressure over time.  To make sure your blood pressure medicine is working.  Supplies needed: You will need a blood pressure machine, or monitor. You can buy one at a drugstore or online. When choosing one:  Choose one with an arm cuff.  Choose one that wraps around your upper arm. Only one finger should fit between your arm and the cuff.  Do not choose one that measures your blood pressure from your wrist or finger.  Your doctor can suggest a monitor. How to prepare Avoid these things for 30 minutes before checking your blood pressure:  Drinking caffeine.  Drinking alcohol.  Eating.  Smoking.  Exercising.  Five minutes before checking your blood pressure:  Pee.  Sit in a dining chair. Avoid sitting in a soft couch or armchair.  Be quiet. Do not talk.  How to take your blood pressure Follow the instructions that came with your machine. If you have a digital blood pressure monitor, these may be the instructions: 1. Sit up straight. 2. Place your feet on the floor. Do not cross your ankles or legs. 3. Rest your left arm at the level of your heart. You may rest it on a table, desk, or chair. 4. Pull up your shirt sleeve. 5. Wrap the blood pressure cuff around the upper part of your left arm. The cuff should be 1 inch (2.5 cm) above your elbow. It is best to wrap the cuff around bare skin. 6. Fit the cuff snugly around your arm. You should be able to place only one finger between the cuff and your arm. 7. Put the cord inside the groove of your elbow. 8. Press the power button. 9. Sit quietly while the cuff fills with air and loses air. 10. Write down the numbers on the screen. 11. Wait 2-3 minutes and then repeat steps 1-10.  What do the numbers  mean? Two numbers make up your blood pressure. The first number is called systolic pressure. The second is called diastolic pressure. An example of a blood pressure reading is "120 over 80" (or 120/80). If you are an adult and do not have a medical condition, use this guide to find out if your blood pressure is normal: Normal  First number: below 120.  Second number: below 80. Elevated  First number: 120-129.  Second number: below 80. Hypertension stage 1  First number: 130-139.  Second number: 80-89. Hypertension stage 2  First number: 140 or above.  Second number: 90 or above. Your blood pressure is above normal even if only the top or bottom number is above normal. Follow these instructions at home:  Check your blood pressure as often as your doctor tells you to.  Take your monitor to your next doctor's appointment. Your doctor will: ? Make sure you are using it correctly. ? Make sure it is working right.  Make sure you understand what your blood pressure numbers should be.  Tell your doctor if your medicines are causing side effects. Contact a doctor if:  Your blood pressure keeps being high. Get help right away if:  Your first blood pressure number is higher than 180.  Your second blood pressure number is higher than 120. This information is not intended to replace advice given   to you by your health care provider. Make sure you discuss any questions you have with your health care provider. Document Released: 11/26/2008 Document Revised: 11/11/2016 Document Reviewed: 05/22/2016 Elsevier Interactive Patient Education  2018 [employer].  Managing Your Hypertension Hypertension is commonly called high blood pressure. This is when the force of your blood pressing against the walls of your arteries is too strong. Arteries are blood vessels that carry blood from your heart throughout your body. Hypertension forces the heart to work harder to pump blood, and may cause  the arteries to become narrow or stiff. Having untreated or uncontrolled hypertension can cause heart attack, stroke, kidney disease, and other problems. What are blood pressure readings? A blood pressure reading consists of a higher number over a lower number. Ideally, your blood pressure should be below 120/80. The first ("top") number is called the systolic pressure. It is a measure of the pressure in your arteries as your heart beats. The second ("bottom") number is called the diastolic pressure. It is a measure of the pressure in your arteries as the heart relaxes. What does my blood pressure reading mean? Blood pressure is classified into four stages. Based on your blood pressure reading, your health care provider may use the following stages to determine what type of treatment you need, if any. Systolic pressure and diastolic pressure are measured in a unit called mm Hg. Normal  Systolic pressure: below 323.  Diastolic pressure: below 80. Elevated  Systolic pressure: 557-322.  Diastolic pressure: below 80. Hypertension stage 1  Systolic pressure: 025-427.  Diastolic pressure: 06-23. Hypertension stage 2  Systolic pressure: 762 or above.  Diastolic pressure: 90 or above. What health risks are associated with hypertension? Managing your hypertension is an important responsibility. Uncontrolled hypertension can lead to:  A heart attack.  A stroke.  A weakened blood vessel (aneurysm).  Heart failure.  Kidney damage.  Eye damage.  Metabolic syndrome.  Memory and concentration problems.  What changes can I make to manage my hypertension? Hypertension can be managed by making lifestyle changes and possibly by taking medicines. Your health care provider will help you make a plan to bring your blood pressure within a normal range. Eating and drinking  Eat a diet that is high in fiber and potassium, and low in salt (sodium), added sugar, and fat. An example eating plan is  called the DASH (Dietary Approaches to Stop Hypertension) diet. To eat this way: ? Eat plenty of fresh fruits and vegetables. Try to fill half of your plate at each meal with fruits and vegetables. ? Eat whole grains, such as whole wheat pasta, brown rice, or whole grain bread. Fill about one quarter of your plate with whole grains. ? Eat low-fat diary products. ? Avoid fatty cuts of meat, processed or cured meats, and poultry with skin. Fill about one quarter of your plate with lean proteins such as fish, chicken without skin, beans, eggs, and tofu. ? Avoid premade and processed foods. These tend to be higher in sodium, added sugar, and fat.  Reduce your daily sodium intake. Most people with hypertension should eat less than 1,500 mg of sodium a day.  Limit alcohol intake to no more than 1 drink a day for nonpregnant women and 2 drinks a day for men. One drink equals 12 oz of beer, 5 oz of wine, or 1 oz of hard liquor. Lifestyle  Work with your health care provider to maintain a healthy body weight, or to lose weight. Ask  what an ideal weight is for you.  Get at least 30 minutes of exercise that causes your heart to beat faster (aerobic exercise) most days of the week. Activities may include walking, swimming, or biking.  Include exercise to strengthen your muscles (resistance exercise), such as weight lifting, as part of your weekly exercise routine. Try to do these types of exercises for 30 minutes at least 3 days a week.  Do not use any products that contain nicotine or tobacco, such as cigarettes and e-cigarettes. If you need help quitting, ask your health care provider.  Control any long-term (chronic) conditions you have, such as high cholesterol or diabetes. Monitoring  Monitor your blood pressure at home as told by your health care provider. Your personal target blood pressure may vary depending on your medical conditions, your age, and other factors.  Have your blood pressure  checked regularly, as often as told by your health care provider. Working with your health care provider  Review all the medicines you take with your health care provider because there may be side effects or interactions.  Talk with your health care provider about your diet, exercise habits, and other lifestyle factors that may be contributing to hypertension.  Visit your health care provider regularly. Your health care provider can help you create and adjust your plan for managing hypertension. Will I need medicine to control my blood pressure? Your health care provider may prescribe medicine if lifestyle changes are not enough to get your blood pressure under control, and if:  Your systolic blood pressure is 130 or higher.  Your diastolic blood pressure is 80 or higher.  Take medicines only as told by your health care provider. Follow the directions carefully. Blood pressure medicines must be taken as prescribed. The medicine does not work as well when you skip doses. Skipping doses also puts you at risk for problems. Contact a health care provider if:  You think you are having a reaction to medicines you have taken.  You have repeated (recurrent) headaches.  You feel dizzy.  You have swelling in your ankles.  You have trouble with your vision. Get help right away if:  You develop a severe headache or confusion.  You have unusual weakness or numbness, or you feel faint.  You have severe pain in your chest or abdomen.  You vomit repeatedly.  You have trouble breathing. Summary  Hypertension is when the force of blood pumping through your arteries is too strong. If this condition is not controlled, it may put you at risk for serious complications.  Your personal target blood pressure may vary depending on your medical conditions, your age, and other factors. For most people, a normal blood pressure is less than 120/80.  Hypertension is managed by lifestyle changes,  medicines, or both. Lifestyle changes include weight loss, eating a healthy, low-sodium diet, exercising more, and limiting alcohol. This information is not intended to replace advice given to you by your health care provider. Make sure you discuss any questions you have with your health care provider. Document Released: 09/07/2012 Document Revised: 11/11/2016 Document Reviewed: 11/11/2016 Elsevier Interactive Patient Education  [employer].

## 2018-09-29 NOTE — Progress Notes (Signed)
Subjective:    Patient ID: Jay Robertson, male    DOB: 24-May-1956, 62 y.o.   MRN: [id number]  No chief complaint on file.   HPI Patient was seen today for f/u on chronic issues and TOC, previously seen by Dr. Burnice Logan.  HTN: -taking benazepril 20 mg daily -checks bp at home, runs higher -spironolactone d/c'd months ago 2/2 hypotension. -no longer feeling sluggish since d/c'ing spironolactone  Breathing concern: -pt notes waking up gasping on occasion -h/o snoring -can fall asleep during day if not doing anything. -never had a sleep study, has been happening for yrs.  Pt retired from 9yrs at the [employer] last month.  Past Medical History:  Diagnosis Date  . ALLERGIC RHINITIS 10/24/2007  . HYPERLIPIDEMIA 10/16/2010  . HYPERTENSION 07/25/2007    Allergies  Allergen Reactions  . Doxycycline Other (See Comments)    Headaches    ROS General: Denies fever, chills, night sweats, changes in weight, changes in appetite  +snoring HEENT: Denies headaches, ear pain, changes in vision, rhinorrhea, sore throat CV: Denies CP, palpitations, SOB, orthopnea Pulm: Denies SOB, cough, wheezing GI: Denies abdominal pain, nausea, vomiting, diarrhea, constipation GU: Denies dysuria, hematuria, frequency, vaginal discharge Msk: Denies muscle cramps, joint pains Neuro: Denies weakness, numbness, tingling Skin: Denies rashes, bruising Psych: Denies depression, anxiety, hallucinations     Objective:    Blood pressure 128/90, pulse 76, temperature 98.1 F (36.7 C), temperature source Oral, weight 182 lb (82.6 kg), SpO2 97 %.   Gen. Pleasant, well-nourished, in no distress, normal affect  Lungs: no accessory muscle use, CTAB, no wheezes or rales Cardiovascular: RRR, no m/r/g, no peripheral edema Neuro:  A&Ox3, CN II-XII intact, normal gait   Wt Readings from Last 3 Encounters:  09/29/18 182 lb (82.6 kg)  07/20/18 180 lb 12.8 oz (82 kg)  07/05/18 182 lb (82.6 kg)    Lab Results   Component Value Date   WBC 7.3 01/09/2015   HGB 13.7 01/09/2015   HCT 38.4 (L) 01/09/2015   PLT 242 01/09/2015   GLUCOSE 105 (H) 01/09/2015   CHOL 220 (H) 10/09/2010   TRIG 62.0 10/09/2010   HDL 42.50 10/09/2010   LDLDIRECT 163.0 10/09/2010   ALT 32 10/09/2010   AST 26 10/09/2010   NA 138 01/09/2015   K 5.1 01/09/2015   CL 101 01/09/2015   CREATININE 0.98 01/09/2015   BUN 17 01/09/2015   CO2 30 01/09/2015   TSH 0.89 10/09/2010   PSA 1.47 10/09/2010    Assessment/Plan:  Essential hypertension -elevated -continue benazepril 20 mg daily -Discussed lifestyle modifications -Patient encouraged to bring BP cuff to clinic to have it checked.  Snoring  -Referral placed for sleep study - Plan: Ambulatory referral to Pulmonology  Pt advised that he has yet to have labs done from annual well check.  Pt declines at this time.  Discussed the importance of regular screening.  Follow-up in the next few weeks.   Grier Mitts, MD

## 2018-11-10 ENCOUNTER — Ambulatory Visit: Payer: Federal, State, Local not specified - PPO | Admitting: Pulmonary Disease

## 2018-11-10 ENCOUNTER — Encounter: Payer: Self-pay | Admitting: Pulmonary Disease

## 2018-11-10 VITALS — BP 142/92 | HR 89 | Ht 71.0 in | Wt 187.0 lb

## 2018-11-10 DIAGNOSIS — G4733 Obstructive sleep apnea (adult) (pediatric): Secondary | ICD-10-CM

## 2018-11-10 NOTE — Progress Notes (Signed)
Jay Robertson    [id number]    [birth year]  Primary Care Physician:Banks, Langley Adie, MD  Referring Physician: Billie Ruddy, [place], [place] [address]  Chief complaint: Patient with a history of gasping respirations at night Daytime fatigue  HPI:  Patient with a history of gasping respirations at night Daytime fatigue He usually gets about 4 5 hours of sleep He has no set bedtime, tries to wake up at 5:30 AM May wake up once or twice during the night Takes about 20 minutes to fall asleep  Denies any other significant health problems Has high blood pressure that is well controlled  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 benazepril (LOTENSIN) 20 MG tablet TAKE 1 TABLET (20 MG TOTAL) BY MOUTH DAILY.  . fexofenadine (ALLEGRA) 180 MG tablet Take 180 mg by mouth daily.  . sildenafil (REVATIO) 20 MG tablet 2 tablets daily as needed  . spironolactone (ALDACTONE) 25 MG tablet Take 1 tablet (25 mg total) by mouth daily.  Marland Kitchen triamcinolone cream (KENALOG) 0.1 % APPLY TO AFFECTED AREA TWICE A DAY  . VIAGRA 50 MG tablet TAKE 1 TABLET BY MOUTH AT BEDTIME AS NEEDED  . [DISCONTINUED] sulfamethoxazole-trimethoprim (BACTRIM DS,SEPTRA DS) 800-160 MG tablet Take 1 tablet by mouth 2 (two) times daily.  . [DISCONTINUED] penicillin v potassium (VEETID) 500 MG tablet Take 1 tablet (500 mg total) by mouth 4 (four) times daily.   No facility-administered encounter medications on file as of 11/10/2018.     Allergies as of 11/10/2018 - Review Complete 11/10/2018  Allergen Reaction Noted  . Doxycycline Other (See Comments)     Past Medical History:  Diagnosis Date  . ALLERGIC RHINITIS 10/24/2007  . HYPERLIPIDEMIA 10/16/2010  . HYPERTENSION 07/25/2007    Past Surgical History:  Procedure Laterality Date  .  APPENDECTOMY    . CLAVICLE SURGERY    . NASAL SEPTUM SURGERY     septal deviation, turbinate hypertrophy and obstruction    No family history on file.  Social History   Socioeconomic History  . Marital status: Single    Spouse name: Not on file  . Number of children: Not on file  . Years of education: Not on file  . Highest education level: Not on file  Occupational History  . Not on file  Social Needs  . Financial resource strain: Not on file  . Food insecurity:    Worry: Not on file    Inability: Not on file  . Transportation needs:    Medical: Not on file    Non-medical: Not on file  Tobacco Use  . Smoking status: Never Smoker  . Smokeless tobacco: Never Used  Substance and Sexual Activity  . Alcohol use: Yes  . Drug use: No  . Sexual activity: Not on file  Lifestyle  . Physical activity:    Days per week: Not on file    Minutes per session: Not on file  . Stress: Not on file  Relationships  . Social connections:    Talks on phone: Not on file    Gets together: Not on file    Attends religious service: Not on file    Active member of club or organization: Not on file    Attends meetings of clubs or organizations: Not on file  Relationship status: Not on file  . Intimate partner violence:    Fear of current or ex partner: Not on file    Emotionally abused: Not on file    Physically abused: Not on file    Forced sexual activity: Not on file  Other Topics Concern  . Not on file  Social History Narrative  . Not on file    Review of Systems  Constitutional: Positive for fatigue.  Respiratory: Negative for shortness of breath.   Psychiatric/Behavioral: Positive for sleep disturbance.    Vitals:   11/10/18 1055  BP: (!) 142/92  Pulse: 89  SpO2: 99%     Physical Exam  Constitutional: He appears well-developed and well-nourished.  HENT:  Head: Normocephalic and atraumatic.  Mallampati 2  Eyes: Pupils are equal, round, and reactive to light.  Right eye exhibits no discharge. Left eye exhibits no discharge.  Neck: Normal range of motion. Neck supple. No tracheal deviation present. No thyromegaly present.  Cardiovascular: Normal rate and regular rhythm.  Pulmonary/Chest: Effort normal and breath sounds normal. No respiratory distress. He has no wheezes.  Abdominal: Soft. Bowel sounds are normal. He exhibits no distension. There is no tenderness.   Assessment:  Moderate probability of significant sleep disordered breathing  Daytime sleepiness   Plan/Recommendations: Pathophysiology of sleep disordered breathing discussed with the patient  Sleep disordered breathing options of treatment discussed with patient  Use of oral device was discussed extensively-this may be his preference of treatment  Importance of regular exercises and weight loss discussed with patient  I will see him back in the office in about 3 months Encouraged to call if any significant symptoms/concerns   Sherrilyn Rist MD [facility] 11/10/2018, 11:21 AM  CC: Billie Ruddy, MD

## 2018-11-10 NOTE — Patient Instructions (Addendum)
Gasping respirations at night, daytime fatigue  Moderate probability obstructive sleep apnea  We will order a home sleep study  Treatment options will include an oral device-I would be evaluated by a dentist CPAP therapy may also be considered  I will see you back in the office in about 3 months

## 2018-11-23 ENCOUNTER — Encounter: Payer: Self-pay | Admitting: Family Medicine

## 2018-11-23 ENCOUNTER — Ambulatory Visit: Payer: Federal, State, Local not specified - PPO | Admitting: Family Medicine

## 2018-11-23 VITALS — BP 130/82 | HR 68 | Temp 97.8°F | Wt 186.0 lb

## 2018-11-23 DIAGNOSIS — I10 Essential (primary) hypertension: Secondary | ICD-10-CM | POA: Diagnosis not present

## 2018-11-23 DIAGNOSIS — R682 Dry mouth, unspecified: Secondary | ICD-10-CM

## 2018-11-23 NOTE — Progress Notes (Signed)
Subjective:    Patient ID: Jay Robertson, male    DOB: [birth year], 62 y.o.   MRN: [id number]  Chief Complaint  Patient presents with  . Blood Pressure Check    HPI Patient was seen today for f/u   HTN: -pt endorses elevated bp readings at home -169/89 -taking benazepril 20 mg.  -since bp elevated has restarted spironolactone.  Med was d/c'd 2/2 hypotension. -has bp machine with him.  Machine is very old from [employer] -pt may drink 1 cup water per day -eating out for most meals.  Eats steak and pork chop 2x/wk. -does not eat many vegetables -takes protein and fiber shakes   Dry mouth: -using OTC mouth wash -thinks it is from a beard/mustache dye he has been using x yrs.  Didn't know the dye should not get on his skin. -drinking 1 cup of water per day.  Past Medical History:  Diagnosis Date  . ALLERGIC RHINITIS 10/24/2007  . HYPERLIPIDEMIA 10/16/2010  . HYPERTENSION 07/25/2007    Allergies  Allergen Reactions  . Doxycycline Other (See Comments)    Headaches    ROS General: Denies fever, chills, night sweats, changes in weight, changes in appetite HEENT: Denies headaches, ear pain, changes in vision, rhinorrhea, sore throat  +Dry mouth CV: Denies CP, palpitations, SOB, orthopnea Pulm: Denies SOB, cough, wheezing GI: Denies abdominal pain, nausea, vomiting, diarrhea, constipation GU: Denies dysuria, hematuria, frequency, vaginal discharge Msk: Denies muscle cramps, joint pains Neuro: Denies weakness, numbness, tingling Skin: Denies rashes, bruising Psych: Denies depression, anxiety, hallucinations     Objective:    Blood pressure 130/82, pulse 68, temperature 97.8 F (36.6 C), temperature source Oral, weight 186 lb (84.4 kg), SpO2 96 %.  Gen. Pleasant, well-nourished, in no distress, normal affect  HEENT: [place]/AT, face symmetric, no scleral icterus, nares patent without drainage Lungs: no accessory muscle use Cardiovascular: RRR, no m/r/g, no peripheral  edema Neuro:  A&Ox3, CN II-XII intact, normal gait    Wt Readings from Last 3 Encounters:  11/10/18 187 lb (84.8 kg)  09/29/18 182 lb (82.6 kg)  07/20/18 180 lb 12.8 oz (82 kg)    Lab Results  Component Value Date   WBC 7.3 01/09/2015   HGB 13.7 01/09/2015   HCT 38.4 (L) 01/09/2015   PLT 242 01/09/2015   GLUCOSE 105 (H) 01/09/2015   CHOL 220 (H) 10/09/2010   TRIG 62.0 10/09/2010   HDL 42.50 10/09/2010   LDLDIRECT 163.0 10/09/2010   ALT 32 10/09/2010   AST 26 10/09/2010   NA 138 01/09/2015   K 5.1 01/09/2015   CL 101 01/09/2015   CREATININE 0.98 01/09/2015   BUN 17 01/09/2015   CO2 30 01/09/2015   TSH 0.89 10/09/2010   PSA 1.47 10/09/2010    Assessment/Plan:  Essential hypertension -bp in office 130/82.  With pt's machine 147/89 -pt advised to purchase new BP cuff and continue recording readings. -Lifestyle modifications encouraged including decreasing sodium intake and increasing p.o. intake of water. -Patient to monitor BP over the weekend.  Should call next week with BP readings.  If elevated at this time will increase benazepril.  Dry mouth -Encouraged to increase p.o. intake of water  Follow-up in 1 month, sooner if needed  Grier Mitts, MD

## 2018-12-06 DIAGNOSIS — G4733 Obstructive sleep apnea (adult) (pediatric): Secondary | ICD-10-CM | POA: Diagnosis not present

## 2018-12-07 ENCOUNTER — Other Ambulatory Visit: Payer: Self-pay | Admitting: *Deleted

## 2018-12-07 DIAGNOSIS — G4733 Obstructive sleep apnea (adult) (pediatric): Secondary | ICD-10-CM

## 2018-12-08 DIAGNOSIS — G4733 Obstructive sleep apnea (adult) (pediatric): Secondary | ICD-10-CM | POA: Diagnosis not present

## 2018-12-16 ENCOUNTER — Telehealth: Payer: Self-pay | Admitting: Pulmonary Disease

## 2018-12-16 NOTE — Telephone Encounter (Signed)
Dr. Ander Slade has reviewed the home sleep test this showed Mild obstructive sleep apnea .   Recommendations   Sleep position optimization by encouraging sleep in the lateral poittion , eveating the head of the bed by 30 degrees may help. Cpap therapy maybe consider if the patient has significant daytime sympotoms  That can be ascribed to  An effect of sleep disordered breathing   Treatment options maybe CPAP with the settings auto 5 to 15.    Regular exercise will help promote good quality sleep  .   Advise against driving while sleepy & against medication with sedative side effects.    Make appointment for 3 months for compliance with download with Dr. Ander Slade. If patient choose a cpap therapy. Other wise as needed.   Spoke with the patient he would like to try the different sleep position first he will call back with further needs.

## 2019-01-01 ENCOUNTER — Other Ambulatory Visit: Payer: Self-pay | Admitting: Internal Medicine

## 2019-01-13 ENCOUNTER — Other Ambulatory Visit: Payer: Self-pay | Admitting: Family Medicine

## 2019-01-13 MED ORDER — TRIAMCINOLONE ACETONIDE 0.1 % EX CREA: TOPICAL_CREAM | CUTANEOUS | 0 refills | Status: DC

## 2019-01-13 NOTE — Telephone Encounter (Signed)
Copied from [place] [id number]. Topic: Quick Communication - Rx Refill/Question >> Jan 13, 2019 10:26 AM Celedonio Savage L wrote: Medication: triamcinolone cream (KENALOG) 0.1 %  Has the patient contacted their pharmacy? Yes.  Reached out to pharmacy 10 days ago and called them today (Agent: If no, request that the patient contact the pharmacy for the refill.) (Agent: If yes, when and what did the pharmacy advise?) to call provider  Preferred Pharmacy (with phone number or street name): [id number] [address], [place] [phone] (Phone) [phone] (Fax)    Agent: Please be advised that RX refills may take up to 3 business days. We ask that you follow-up with your pharmacy.

## 2019-01-13 NOTE — Telephone Encounter (Signed)
Requested Prescriptions  Pending Prescriptions Disp Refills  . triamcinolone cream (KENALOG) 0.1 % 30 g 0    Sig: APPLY TO AFFECTED AREA TWICE A DAY     Dermatology:  Corticosteroids Passed - 01/13/2019 10:30 AM      Passed - Valid encounter within last 12 months    Recent Outpatient Visits          1 month ago Essential hypertension   Therapist, music at [place], MD   3 months ago Essential hypertension   Therapist, music at [place], MD   5 months ago [place] [facility], Doretha Sou, MD   6 months ago Essential hypertension   Therapist, music at [facility], Doretha Sou, MD   6 months ago Essential hypertension   Therapist, music at [facility], Doretha Sou, MD      Future Appointments            In 3 days Volanda Napoleon, Langley Adie, MD [employer] at [place], [facility]

## 2019-01-16 ENCOUNTER — Ambulatory Visit: Payer: Federal, State, Local not specified - PPO | Admitting: Family Medicine

## 2019-02-12 ENCOUNTER — Emergency Department (HOSPITAL_COMMUNITY)
Admission: EM | Admit: 2019-02-12 | Discharge: 2019-02-12 | Disposition: A | Discharge status: Home / Self Care | Payer: Federal, State, Local not specified - PPO | Attending: Emergency Medicine | Admitting: Emergency Medicine

## 2019-02-12 ENCOUNTER — Encounter (HOSPITAL_COMMUNITY): Payer: Self-pay | Admitting: Emergency Medicine

## 2019-02-12 ENCOUNTER — Other Ambulatory Visit: Payer: Self-pay

## 2019-02-12 ENCOUNTER — Emergency Department (HOSPITAL_COMMUNITY): Payer: Federal, State, Local not specified - PPO

## 2019-02-12 DIAGNOSIS — Z79899 Other long term (current) drug therapy: Secondary | ICD-10-CM | POA: Insufficient documentation

## 2019-02-12 DIAGNOSIS — R51 Headache: Secondary | ICD-10-CM | POA: Diagnosis not present

## 2019-02-12 DIAGNOSIS — J189 Pneumonia, unspecified organism: Secondary | ICD-10-CM | POA: Diagnosis not present

## 2019-02-12 DIAGNOSIS — J181 Lobar pneumonia, unspecified organism: Secondary | ICD-10-CM | POA: Insufficient documentation

## 2019-02-12 DIAGNOSIS — Z7982 Long term (current) use of aspirin: Secondary | ICD-10-CM | POA: Insufficient documentation

## 2019-02-12 DIAGNOSIS — I10 Essential (primary) hypertension: Secondary | ICD-10-CM | POA: Insufficient documentation

## 2019-02-12 DIAGNOSIS — M545 Low back pain: Secondary | ICD-10-CM | POA: Diagnosis not present

## 2019-02-12 DIAGNOSIS — R509 Fever, unspecified: Secondary | ICD-10-CM | POA: Diagnosis not present

## 2019-02-12 DIAGNOSIS — R000 Tachycardia, unspecified: Secondary | ICD-10-CM | POA: Diagnosis not present

## 2019-02-12 DIAGNOSIS — R05 Cough: Secondary | ICD-10-CM | POA: Diagnosis not present

## 2019-02-12 LAB — URINALYSIS, ROUTINE W REFLEX MICROSCOPIC
Bilirubin Urine: NEGATIVE
Glucose, UA: NEGATIVE mg/dL
Hgb urine dipstick: NEGATIVE
Ketones, ur: 5 mg/dL — AB
Nitrite: NEGATIVE
Protein, ur: NEGATIVE mg/dL
Specific Gravity, Urine: 1.02 (ref 1.005–1.030)
pH: 7 (ref 5.0–8.0)

## 2019-02-12 LAB — COMPREHENSIVE METABOLIC PANEL
ALT: 26 U/L (ref 0–44)
AST: 21 U/L (ref 15–41)
Albumin: 3.9 g/dL (ref 3.5–5.0)
Alkaline Phosphatase: 59 U/L (ref 38–126)
Anion gap: 12 (ref 5–15)
BUN: 10 mg/dL (ref 8–23)
CO2: 22 mmol/L (ref 22–32)
Calcium: 9.4 mg/dL (ref 8.9–10.3)
Chloride: 103 mmol/L (ref 98–111)
Creatinine, Ser: 1.05 mg/dL (ref 0.61–1.24)
GFR calc Af Amer: >60 mL/min (ref >60)
GFR calc non Af Amer: >60 mL/min (ref >60)
Glucose, Bld: 109 mg/dL — ABNORMAL HIGH (ref 70–99)
Potassium: 3.6 mmol/L (ref 3.5–5.1)
Sodium: 137 mmol/L (ref 135–145)
Total Bilirubin: 1.1 mg/dL (ref 0.3–1.2)
Total Protein: 7.7 g/dL (ref 6.5–8.1)

## 2019-02-12 LAB — CBC WITH DIFFERENTIAL/PLATELET
Abs Immature Granulocytes: 0.06 10*3/uL (ref 0.00–0.07)
Basophils Absolute: 0 10*3/uL (ref 0.0–0.1)
Basophils Relative: 0 %
Eosinophils Absolute: 0.1 10*3/uL (ref 0.0–0.5)
Eosinophils Relative: 1 %
HCT: 41.5 % (ref 39.0–52.0)
Hemoglobin: 14.1 g/dL (ref 13.0–17.0)
Immature Granulocytes: 1 %
Lymphocytes Relative: 6 %
Lymphs Abs: 0.7 10*3/uL (ref 0.7–4.0)
MCH: 31.9 pg (ref 26.0–34.0)
MCHC: 34 g/dL (ref 30.0–36.0)
MCV: 93.9 fL (ref 80.0–100.0)
Monocytes Absolute: 0.6 10*3/uL (ref 0.1–1.0)
Monocytes Relative: 4 %
Neutro Abs: 11.2 10*3/uL — ABNORMAL HIGH (ref 1.7–7.7)
Neutrophils Relative %: 88 %
Platelets: 235 10*3/uL (ref 150–400)
RBC: 4.42 MIL/uL (ref 4.22–5.81)
RDW: 14.1 % (ref 11.5–15.5)
WBC: 12.6 10*3/uL — ABNORMAL HIGH (ref 4.0–10.5)
nRBC: 0 % (ref 0.0–0.2)

## 2019-02-12 LAB — LACTIC ACID, PLASMA
Lactic Acid, Venous: 1.7 mmol/L (ref 0.5–1.9)
Lactic Acid, Venous: 1.3 mmol/L (ref 0.5–1.9)

## 2019-02-12 MED ORDER — AZITHROMYCIN 250 MG PO TABS: 250.0000 mg | ORAL_TABLET | Freq: Every day | ORAL | 0 refills | Status: DC

## 2019-02-12 MED ORDER — AMOXICILLIN-POT CLAVULANATE 875-125 MG PO TABS: 1.0000 | ORAL_TABLET | Freq: Two times a day (BID) | ORAL | 0 refills | Status: AC

## 2019-02-12 MED ORDER — ACETAMINOPHEN 325 MG PO TABS
650.0000 mg | ORAL_TABLET | Freq: Once | ORAL | Status: AC | PRN
  Administered 2019-02-12: 650 mg via ORAL
  Filled 2019-02-12: qty 2

## 2019-02-12 MED ORDER — IBUPROFEN 400 MG PO TABS
600.0000 mg | ORAL_TABLET | Freq: Once | ORAL | Status: AC
  Administered 2019-02-12: 600 mg via ORAL
  Filled 2019-02-12: qty 1

## 2019-02-12 MED ORDER — AMOXICILLIN-POT CLAVULANATE 875-125 MG PO TABS: 1.0000 | ORAL_TABLET | Freq: Two times a day (BID) | ORAL | 0 refills | Status: DC

## 2019-02-12 MED ORDER — SODIUM CHLORIDE 0.9 % IV BOLUS
1000.0000 mL | Freq: Once | INTRAVENOUS | Status: AC
  Administered 2019-02-12: 1000 mL via INTRAVENOUS

## 2019-02-12 NOTE — ED Notes (Signed)
Pt also reports left sided flank pain, denies urinary symptoms.

## 2019-02-12 NOTE — ED Triage Notes (Signed)
Pt presents with fever and productive cough x 2 days.

## 2019-02-12 NOTE — ED Notes (Signed)
Pt transported to xray 

## 2019-02-12 NOTE — Discharge Instructions (Addendum)
Please read instructions below. Take the antibiotics, starting today, as prescribed until gone. Alternate Tylenol and Advil every 4 hours for fever and body aches. Schedule an appointment with your primary care provider this week to follow-up on your visit today. Return to the emergency department if symptoms worsen, if you develop shortness of breath, or new or concerning symptoms.

## 2019-02-12 NOTE — ED Provider Notes (Signed)
[facility] EMERGENCY DEPARTMENT Provider Note   CSN: [id number] Arrival date & time: 02/12/19  1543     History   Chief Complaint Chief Complaint  Patient presents with  . Fever  . Cough    HPI Jay Robertson is a 63 y.o. male with past medical history of hypertension, presenting to the emergency department with 2 days of productive cough of yellow sputum.  Patient states he has had some low back cramping and mild headache as well.  Has treated symptoms with aspirin.  No known fever at home, however is febrile upon arrival.  Denies associated shortness of breath, difficulty swallowing, sore throat, congestion, ear pain, abdominal pain, nausea or vomiting.  No known sick contacts.  Has not had the influenza vaccine this season.  The history is provided by the patient.    Past Medical History:  Diagnosis Date  . ALLERGIC RHINITIS 10/24/2007  . HYPERLIPIDEMIA 10/16/2010  . HYPERTENSION 07/25/2007    Patient Active Problem List   Diagnosis Date Noted  . Pure hypercholesterolemia 10/16/2010  . Allergic rhinitis 10/24/2007  . Essential hypertension 07/25/2007    Past Surgical History:  Procedure Laterality Date  . APPENDECTOMY    . CLAVICLE SURGERY    . NASAL SEPTUM SURGERY     septal deviation, turbinate hypertrophy and obstruction        Home Medications    Prior to Admission medications   Medication Sig Start Date End Date Taking? Authorizing Provider  aspirin EC 325 MG tablet Take 325 mg by mouth every other day.   Yes [provider]  benazepril (LOTENSIN) 20 MG tablet TAKE 1 TABLET (20 MG TOTAL) BY MOUTH DAILY. Patient taking differently: Take 20 mg by mouth daily.  06/22/18  Yes Marletta Lor, MD  Brimonidine Tartrate (LUMIFY) 0.025 % SOLN Place 1-2 drops into both eyes 3 (three) times daily as needed (to reduce redness/irritation).    Yes [provider]  Dextran 70-Hypromellose (ARTIFICIAL TEARS PF OP) Place 2 drops  into both eyes every morning.   Yes [provider]  fexofenadine (ALLEGRA) 180 MG tablet Take 180 mg by mouth daily as needed for allergies or rhinitis.    Yes [provider]  mineral oil liquid Take 5 mLs by mouth daily as needed for mild constipation or moderate constipation.    Yes [provider]  triamcinolone cream (KENALOG) 0.1 % APPLY TO AFFECTED AREA TWICE A DAY Patient taking differently: Apply 1 application topically See admin instructions. Apply to affected area(s) two times a day 01/13/19  Yes Billie Ruddy, MD  amoxicillin-clavulanate (AUGMENTIN) 875-125 MG tablet Take 1 tablet by mouth every 12 (twelve) hours for 7 days. 02/12/19 02/19/19  Robinson, [place] N, PA-C  azithromycin (ZITHROMAX) 250 MG tablet Take 1 tablet (250 mg total) by mouth daily. Take first 2 tablets together, then 1 every day until finished. 02/12/19   Robinson, [place] N, PA-C  sildenafil (REVATIO) 20 MG tablet 2 tablets daily as needed Patient not taking: Reported on 02/12/2019 09/02/16   Marletta Lor, MD  spironolactone (ALDACTONE) 25 MG tablet Take 1 tablet (25 mg total) by mouth daily. Patient not taking: Reported on 02/12/2019 06/22/18   Marletta Lor, MD  VIAGRA 50 MG tablet TAKE 1 TABLET BY MOUTH AT BEDTIME AS NEEDED Patient not taking: Reported on 02/12/2019 08/10/16   Marletta Lor, MD    Family History No family history on file.  Social History Social History  Tobacco Use  . Smoking status: Never Smoker  . Smokeless tobacco: Never Used  Substance Use Topics  . Alcohol use: Yes  . Drug use: No     Allergies   Doxycycline   Review of Systems Review of Systems  Constitutional: Positive for fever.  HENT: Negative for congestion, ear pain and sore throat.   Respiratory: Positive for cough. Negative for shortness of breath.   Gastrointestinal: Negative for abdominal pain, nausea and vomiting.  Musculoskeletal: Positive for back pain.    Neurological: Positive for headaches.  All other systems reviewed and are negative.    Physical Exam Updated Vital Signs BP 124/76   Pulse 99   Temp 99.5 F (37.5 C) (Oral)   Resp 16   SpO2 96%   Physical Exam Vitals signs and nursing note reviewed.  Constitutional:      General: He is not in acute distress.    Appearance: He is well-developed. He is not ill-appearing.  HENT:     Head: Normocephalic and atraumatic.     Nose: Nose normal.     Mouth/Throat:     Mouth: Mucous membranes are moist.     Pharynx: Oropharynx is clear.  Eyes:     Conjunctiva/sclera: Conjunctivae normal.  Neck:     Musculoskeletal: Normal range of motion and neck supple.  Cardiovascular:     Rate and Rhythm: Regular rhythm. Tachycardia present.     Heart sounds: Normal heart sounds.  Pulmonary:     Effort: Pulmonary effort is normal. No respiratory distress.     Breath sounds: Rales (LLL) present.  Abdominal:     General: Bowel sounds are normal. There is no distension.     Palpations: Abdomen is soft.     Tenderness: There is no abdominal tenderness.  Lymphadenopathy:     Cervical: No cervical adenopathy.  Skin:    General: Skin is warm.  Neurological:     Mental Status: He is alert.  Psychiatric:        Behavior: Behavior normal.      ED Treatments / Results  Labs (all labs ordered are listed, but only abnormal results are displayed) Labs Reviewed  COMPREHENSIVE METABOLIC PANEL - Abnormal; Notable for the following components:      Result Value   Glucose, Bld 109 (*)    All other components within normal limits  CBC WITH DIFFERENTIAL/PLATELET - Abnormal; Notable for the following components:   WBC 12.6 (*)    Neutro Abs 11.2 (*)    All other components within normal limits  URINALYSIS, ROUTINE W REFLEX MICROSCOPIC - Abnormal; Notable for the following components:   Ketones, ur 5 (*)    Leukocytes,Ua SMALL (*)    Bacteria, UA RARE (*)    All other components within normal  limits  LACTIC ACID, PLASMA  LACTIC ACID, PLASMA    EKG None  Radiology Dg Chest 2 View  Result Date: 02/12/2019 CLINICAL DATA:  Productive cough for 2 days EXAM: CHEST - 2 VIEW COMPARISON:  None. FINDINGS: No pneumothorax. The cardiomediastinal silhouette is normal. Opacity posterior the heart on the lateral view is most likely pneumonia given history. I suspect this is in the left lower lobe. No other acute abnormalities. IMPRESSION: Left retrocardiac infiltrate, likely pneumonia given history. Recommend follow-up to resolution. Electronically Signed   By: Dorise Bullion III M.D   On: 02/12/2019 17:25    Procedures Procedures (including critical care time)  Medications Ordered in ED Medications  acetaminophen (TYLENOL) tablet 650  mg (650 mg Oral Given 02/12/19 1611)  sodium chloride 0.9 % bolus 1,000 mL (0 mLs Intravenous Stopped 02/12/19 1813)  ibuprofen (ADVIL,MOTRIN) tablet 600 mg (600 mg Oral Given 02/12/19 1812)     Initial Impression / Assessment and Plan / ED Course  I have reviewed the triage vital signs and the nursing notes.  Pertinent labs & imaging results that were available during my care of the patient were reviewed by me and considered in my medical decision making (see chart for details).  Clinical Course as of Feb 12 1899  Sun Feb 12, 2019  1849 Patient reevaluated.  Reports significant improvement in symptoms.  Repeat temperature on my check is 99.8 F.  Labs obtained in triage are overall reassuring.  We will treat for community-acquired pneumonia.   [JR]    Clinical Course User Index [JR] Robinson, [place] N, PA-C    Patient with productive cough and fever x2 days.  No shortness of breath.  Febrile on arrival with some mild tachycardia.  O2 saturation 99% on room air.  Labs ordered in triage are reassuring.  Mild leukocytosis, consistent with presentation.  Exam with rales to the left base, chest x-ray with left lower lobe pneumonia.  Treated with  anti-diuretics and IV fluids with significant improvement in vital signs.   Pt is not ill appearing, immunocompromised, and does not have multiple co morbidities, therefore I feel like the they can be treated as an OP with abx therapy. Pt has been advised to return to the ED if symptoms worsen or they do not improve. Pt verbalizes understanding and is agreeable with plan.   Discussed results, findings, treatment and follow up. Patient advised of return precautions. Patient verbalized understanding and agreed with plan.  Final Clinical Impressions(s) / ED Diagnoses   Final diagnoses:  Community acquired pneumonia of left lower lobe of lung [facility])    ED Discharge Orders         Ordered    amoxicillin-clavulanate (AUGMENTIN) 875-125 MG tablet  Every 12 hours,   Status:  Discontinued     02/12/19 1855    azithromycin (ZITHROMAX) 250 MG tablet  Daily,   Status:  Discontinued     02/12/19 1855    amoxicillin-clavulanate (AUGMENTIN) 875-125 MG tablet  Every 12 hours     02/12/19 1857    azithromycin (ZITHROMAX) 250 MG tablet  Daily     02/12/19 1857           Robinson, [place] N, [place] 02/12/19 Charna Elizabeth, MD 02/25/19 [phone]

## 2019-02-16 ENCOUNTER — Ambulatory Visit: Payer: Federal, State, Local not specified - PPO | Admitting: Family Medicine

## 2019-02-16 ENCOUNTER — Encounter: Payer: Self-pay | Admitting: Family Medicine

## 2019-02-16 VITALS — BP 146/84 | HR 68 | Temp 98.2°F | Wt 186.0 lb

## 2019-02-16 DIAGNOSIS — J181 Lobar pneumonia, unspecified organism: Secondary | ICD-10-CM | POA: Diagnosis not present

## 2019-02-16 DIAGNOSIS — J189 Pneumonia, unspecified organism: Secondary | ICD-10-CM

## 2019-02-16 NOTE — Progress Notes (Signed)
Subjective:    Patient ID: Jay Robertson, male    DOB: December 25, 1956, 63 y.o.   MRN: [id number]  No chief complaint on file.   HPI Patient was seen today for ED f/u.  Pt seen in ED 02/12/19 for LLL pneumonia.  Pt given Augmentin x 7 d and d/c'd home with close follow-up.  Pt states he is feeling much better.  Pt states his breathing has improved and his cough has stopped.  Pt still with mild nasal congestion and feeling slightly fatigued but notes improvement daily.  Pt denies fever, nausea, vomiting, chest pain, headaches, sore throat.  Past Medical History:  Diagnosis Date  . ALLERGIC RHINITIS 10/24/2007  . HYPERLIPIDEMIA 10/16/2010  . HYPERTENSION 07/25/2007    Allergies  Allergen Reactions  . Doxycycline Other (See Comments)    Headaches    ROS General: Denies fever, chills, night sweats, changes in weight, changes in appetite  +fatigue HEENT: Denies headaches, ear pain, changes in vision, rhinorrhea, sore throat  +nasal congestion CV: Denies CP, palpitations, SOB, orthopnea Pulm: Denies SOB, cough, wheezing GI: Denies abdominal pain, nausea, vomiting, diarrhea, constipation GU: Denies dysuria, hematuria, frequency, vaginal discharge Msk: Denies muscle cramps, joint pains Neuro: Denies weakness, numbness, tingling Skin: Denies rashes, bruising Psych: Denies depression, anxiety, hallucinations     Objective:    Blood pressure (!) 146/84, pulse 68, temperature 98.2 F (36.8 C), temperature source Oral, weight 186 lb (84.4 kg), SpO2 97 %.  Gen. Pleasant, well-nourished, in no distress, normal affect   HEENT: [place]/AT, face symmetric, no scleral icterus, PERRLA, nares patent without drainage, pharynx without erythema or exudate. Lungs: no accessory muscle use, CTAB, no wheezes or rales Cardiovascular: RRR, no m/r/g, no peripheral edema Neuro:  A&Ox3, CN II-XII intact, normal gait Skin:  Warm, no lesions/ rash   Wt Readings from Last 3 Encounters:  02/16/19 186 lb (84.4 kg)   11/23/18 186 lb (84.4 kg)  11/10/18 187 lb (84.8 kg)    Lab Results  Component Value Date   WBC 12.6 (H) 02/12/2019   HGB 14.1 02/12/2019   HCT 41.5 02/12/2019   PLT 235 02/12/2019   GLUCOSE 109 (H) 02/12/2019   CHOL 220 (H) 10/09/2010   TRIG 62.0 10/09/2010   HDL 42.50 10/09/2010   LDLDIRECT 163.0 10/09/2010   ALT 26 02/12/2019   AST 21 02/12/2019   NA 137 02/12/2019   K 3.6 02/12/2019   CL 103 02/12/2019   CREATININE 1.05 02/12/2019   BUN 10 02/12/2019   CO2 22 02/12/2019   TSH 0.89 10/09/2010   PSA 1.47 10/09/2010    Assessment/Plan:  Pneumonia of left lower lobe due to infectious organism ([place]) -improving -will recheck bp as slightly elevated -pt encouraged to complete Augmentin  -Given RTC or ED precautions.  F/u prn  Grier Mitts, MD

## 2019-02-23 ENCOUNTER — Telehealth: Payer: Self-pay | Admitting: Family Medicine

## 2019-02-23 NOTE — Telephone Encounter (Signed)
Copied from [place] [id number]. Topic: Quick Communication - Rx Refill/Question >> Feb 23, 2019  4:05 PM Reyne Dumas L wrote: Medication: benazepril (LOTENSIN) 20 MG tablet  Has the patient contacted their pharmacy? No - pt is concerned about Corona Virus spreading and he wants to get several months of this medication on hand for him to have. (Agent: If no, request that the patient contact the pharmacy for the refill.) (Agent: If yes, when and what did the pharmacy advise?)  Preferred Pharmacy (with phone number or street name): [id number] [address], [place] [phone] (Phone) [phone] (Fax)  Agent: Please be advised that RX refills may take up to 3 business days. We ask that you follow-up with your pharmacy.

## 2019-02-23 NOTE — Telephone Encounter (Signed)
Request sent to PCP for review of request. Patient normally gets 90 day supply per chart.

## 2019-02-24 ENCOUNTER — Other Ambulatory Visit: Payer: Self-pay

## 2019-02-24 MED ORDER — BENAZEPRIL HCL 20 MG PO TABS: ORAL_TABLET | ORAL | 3 refills | Status: DC

## 2019-02-24 NOTE — Telephone Encounter (Signed)
Rx sent to pt pharmacy 

## 2019-07-04 ENCOUNTER — Telehealth: Payer: Self-pay | Admitting: Family Medicine

## 2019-07-04 NOTE — Telephone Encounter (Signed)
Ok to send Rx or schedule pt for a virtual visit

## 2019-07-04 NOTE — Telephone Encounter (Signed)
Pt should be seen.  Ok to send in a 1 month supply of spironolactone 25 mg while pt is scheduling an appt.

## 2019-07-04 NOTE — Telephone Encounter (Signed)
Pt calling and stated Dr Raliegh Ip had taken him off of spironolactone, but his BP started going back up and he put himself back on it. Pt now need refill. Do pt need an appt with Dr Volanda Napoleon or can this medication be refilled without appt.  [facility]

## 2019-07-05 ENCOUNTER — Other Ambulatory Visit: Payer: Self-pay

## 2019-07-05 MED ORDER — SPIRONOLACTONE 25 MG PO TABS: 25.0000 mg | ORAL_TABLET | Freq: Every day | ORAL | 0 refills | Status: DC

## 2019-07-05 NOTE — Telephone Encounter (Signed)
Rx sent to pt pharmacy 

## 2019-07-27 ENCOUNTER — Other Ambulatory Visit: Payer: Self-pay | Admitting: Family Medicine

## 2019-09-27 ENCOUNTER — Encounter: Payer: Self-pay | Admitting: Family Medicine

## 2019-09-27 ENCOUNTER — Ambulatory Visit: Payer: Federal, State, Local not specified - PPO | Admitting: Family Medicine

## 2019-09-27 ENCOUNTER — Other Ambulatory Visit: Payer: Self-pay

## 2019-09-27 VITALS — BP 140/90 | HR 84 | Temp 98.0°F | Wt 186.0 lb

## 2019-09-27 DIAGNOSIS — I10 Essential (primary) hypertension: Secondary | ICD-10-CM

## 2019-09-27 DIAGNOSIS — R252 Cramp and spasm: Secondary | ICD-10-CM

## 2019-09-27 NOTE — Progress Notes (Signed)
Subjective:    Patient ID: Jay Robertson, male    DOB: 30-Aug-1956, 63 y.o.   MRN: [id number]  No chief complaint on file.   HPI Patient was seen today for f/u.  Pt notes taking benazepril 10 mg.  Pt restarted spironolactone 25 mg as bp started to become elevated.  Pt feels like in the past the spironolactone 25 mg made his bp too low, he felt dizzy/funny.  Pt hasn't checked bp recently.  Pt at times forgets to drink water if is busy doing something.  Pt will at times develop muscle cramps.  Relieved by eating a banana.  Pt request a refill on triamcinolone cream.  Occasionally uses cream on the back of neck/hairline when develops bumps.  Patient states currently using [place] soap on face and neck.  So causing skin dryness.  Past Medical History:  Diagnosis Date  . ALLERGIC RHINITIS 10/24/2007  . HYPERLIPIDEMIA 10/16/2010  . HYPERTENSION 07/25/2007    Allergies  Allergen Reactions  . Doxycycline Other (See Comments)    Headaches    ROS General: Denies fever, chills, night sweats, changes in weight, changes in appetite HEENT: Denies headaches, ear pain, changes in vision, rhinorrhea, sore throat CV: Denies CP, palpitations, SOB, orthopnea Pulm: Denies SOB, cough, wheezing GI: Denies abdominal pain, nausea, vomiting, diarrhea, constipation GU: Denies dysuria, hematuria, frequency, vaginal discharge Msk: Denies joint pains  +muscle cramps Neuro: Denies weakness, numbness, tingling Skin: Denies bruising  +rash, dry skin of face and neck Psych: Denies depression, anxiety, hallucinations     Objective:    Blood pressure 140/90, pulse 84, temperature 98 F (36.7 C), temperature source Oral, weight 186 lb (84.4 kg), SpO2 98 %.  Gen. Pleasant, well-nourished, in no distress, normal affect  HEENT: [place]/AT, face symmetric, conjunctiva clear, no scleral icterus, PERRLA, EOMI, nares patent without drainage Lungs: no accessory muscle use, CTAB, no wheezes or rales Cardiovascular: RRR, no m/r/g,  no peripheral edema Musculoskeletal: No deformities, no cyanosis or clubbing, normal tone Neuro:  A&Ox3, CN II-XII intact, normal gait Skin:  Warm, no lesions/ rash   Wt Readings from Last 3 Encounters:  09/27/19 186 lb (84.4 kg)  02/16/19 186 lb (84.4 kg)  11/23/18 186 lb (84.4 kg)    Lab Results  Component Value Date   WBC 12.6 (H) 02/12/2019   HGB 14.1 02/12/2019   HCT 41.5 02/12/2019   PLT 235 02/12/2019   GLUCOSE 109 (H) 02/12/2019   CHOL 220 (H) 10/09/2010   TRIG 62.0 10/09/2010   HDL 42.50 10/09/2010   LDLDIRECT 163.0 10/09/2010   ALT 26 02/12/2019   AST 21 02/12/2019   NA 137 02/12/2019   K 3.6 02/12/2019   CL 103 02/12/2019   CREATININE 1.05 02/12/2019   BUN 10 02/12/2019   CO2 22 02/12/2019   TSH 0.89 10/09/2010   PSA 1.47 10/09/2010    Assessment/Plan:  Essential hypertension -elevated.  Remained elevated after recheck 148/96 -continue benazepril 10 mg -will try spironolactone 12.5 mg daily -discussed lifestyle modifications and need to check bp at home -discussed need for BMP.  Pt declines at this time.  Muscle cramps -discussed causes -offered handout, pt declines -discussed obtaining BMP.  Pt declines  F/u in 1 month  Grier Mitts, MD

## 2019-09-28 ENCOUNTER — Other Ambulatory Visit: Payer: Self-pay | Admitting: Family Medicine

## 2019-09-28 MED ORDER — TRIAMCINOLONE ACETONIDE 0.1 % EX CREA: TOPICAL_CREAM | CUTANEOUS | 0 refills | Status: DC

## 2019-09-28 NOTE — Telephone Encounter (Signed)
Medication Refill - Medication:  triamcinolone cream (KENALOG) 0.1  Has the patient contacted their pharmacy?  Yes advised to call office.   Preferred Pharmacy (with phone number or street name):  [id number] [address], [place] [phone] (Phone) [phone] (Fax)   Agent: Please be advised that RX refills may take up to 3 business days. We ask that you follow-up with your pharmacy.

## 2019-10-04 ENCOUNTER — Telehealth: Payer: Self-pay | Admitting: Family Medicine

## 2019-10-04 ENCOUNTER — Other Ambulatory Visit: Payer: Self-pay

## 2019-10-04 MED ORDER — SPIRONOLACTONE 25 MG PO TABS: 25.0000 mg | ORAL_TABLET | Freq: Every day | ORAL | 1 refills | Status: DC

## 2019-10-04 NOTE — Telephone Encounter (Signed)
Rx sent 

## 2019-10-04 NOTE — Telephone Encounter (Signed)
rx refill spironolactone (ALDACTONE) 25 MG tablet [id number]  [facility]Y [id number] [address], [place] (Phone)

## 2019-10-27 ENCOUNTER — Other Ambulatory Visit: Payer: Self-pay

## 2019-10-27 ENCOUNTER — Other Ambulatory Visit: Payer: Self-pay | Admitting: Family Medicine

## 2019-10-27 MED ORDER — BENAZEPRIL HCL 20 MG PO TABS: ORAL_TABLET | ORAL | 3 refills | Status: DC

## 2019-12-03 ENCOUNTER — Other Ambulatory Visit: Payer: Self-pay | Admitting: Family Medicine

## 2019-12-28 ENCOUNTER — Other Ambulatory Visit: Payer: Self-pay | Admitting: Family Medicine

## 2020-01-28 ENCOUNTER — Other Ambulatory Visit: Payer: Self-pay | Admitting: Family Medicine

## 2020-01-29 ENCOUNTER — Telehealth: Payer: Self-pay

## 2020-01-29 ENCOUNTER — Other Ambulatory Visit: Payer: Self-pay

## 2020-01-29 ENCOUNTER — Telehealth: Payer: Self-pay | Admitting: Family Medicine

## 2020-01-29 MED ORDER — TRIAMCINOLONE ACETONIDE 0.1 % EX CREA: TOPICAL_CREAM | CUTANEOUS | 3 refills | Status: DC

## 2020-01-29 NOTE — Telephone Encounter (Signed)
Rx has been sent in. 

## 2020-01-29 NOTE — Telephone Encounter (Signed)
pt is requesting more  refill on the  triamcinolone cream (KENALOG) 0.1 %-   [id number] [address], [place]  Phone:  [phone] Fax:  [phone] contact  number  [phone]

## 2020-01-29 NOTE — Telephone Encounter (Signed)
Rx sent to pt pharmacy with refills per pt request, pt is aware

## 2020-01-29 NOTE — Telephone Encounter (Signed)
Pt was at he office for his Vit B12 injection this morning, pt state that he will be going out of town for 2 weeks and would like his wife who is a  Retired Therapist, sports will be administering the B12 injections for the 2  Weeks they will be out, pt  request for a prescription for B12 medication and syringes sent to [facility]. Message sent to Dr Sarajane Jews [facility]

## 2020-02-27 ENCOUNTER — Telehealth (INDEPENDENT_AMBULATORY_CARE_PROVIDER_SITE_OTHER): Payer: Federal, State, Local not specified - PPO | Admitting: Family Medicine

## 2020-02-27 DIAGNOSIS — L209 Atopic dermatitis, unspecified: Secondary | ICD-10-CM | POA: Diagnosis not present

## 2020-02-27 MED ORDER — TRIAMCINOLONE ACETONIDE 0.1 % EX CREA: 1.0000 "application " | TOPICAL_CREAM | Freq: Two times a day (BID) | CUTANEOUS | 3 refills | Status: DC

## 2020-02-27 NOTE — Progress Notes (Signed)
Virtual Visit via Video Note  I connected with Jay Robertson on 02/27/20 at  3:00 PM EST by a video enabled telemedicine application 2/2 [id number] pandemic and verified that I am speaking with the correct person using two identifiers.  Location patient: home Location provider:work or home office Persons participating in the virtual visit: patient, provider  I discussed the limitations of evaluation and management by telemedicine and the availability of in person appointments. The patient expressed understanding and agreed to proceed.   HPI: Pt is a 64 yo male with pmh sig for HTN, HLD, ED.   Pt with rash on leg on and off x yrs.  Notes itchy, dry skin, with dark spots.  Pt does not typically use lotions.  Tried eczema lotion but it made it bumpy and burn.  Tried oils in the past.  Pt tried Triamcinolone cream x 2 days which helped some but hasn't cleared it up all the way.   Uses triamcinolone cream for occasional hair bump on posterior scalp.  Pt recently changed from [place] with cocoa butter to a new one.   ROS: See pertinent positives and negatives per HPI.  Past Medical History:  Diagnosis Date  . ALLERGIC RHINITIS 10/24/2007  . HYPERLIPIDEMIA 10/16/2010  . HYPERTENSION 07/25/2007    Past Surgical History:  Procedure Laterality Date  . APPENDECTOMY    . CLAVICLE SURGERY    . NASAL SEPTUM SURGERY     septal deviation, turbinate hypertrophy and obstruction    No family history on file.  Current Outpatient Medications:  .  aspirin EC 325 MG tablet, Take 325 mg by mouth every other day., Disp: , Rfl:  .  benazepril (LOTENSIN) 20 MG tablet, TAKE 1 TABLET (20 MG TOTAL) BY MOUTH DAILY., Disp: 90 tablet, Rfl: 3 .  Brimonidine Tartrate (LUMIFY) 0.025 % SOLN, Place 1-2 drops into both eyes 3 (three) times daily as needed (to reduce redness/irritation). , Disp: , Rfl:  .  Dextran 70-Hypromellose (ARTIFICIAL TEARS PF OP), Place 2 drops into both eyes every morning., Disp: , Rfl:  .   fexofenadine (ALLEGRA) 180 MG tablet, Take 180 mg by mouth daily as needed for allergies or rhinitis. , Disp: , Rfl:  .  mineral oil liquid, Take 5 mLs by mouth daily as needed for mild constipation or moderate constipation. , Disp: , Rfl:  .  sildenafil (REVATIO) 20 MG tablet, 2 tablets daily as needed, Disp: 30 tablet, Rfl: 4 .  spironolactone (ALDACTONE) 25 MG tablet, TAKE 1 TABLET BY MOUTH EVERY DAY, Disp: 90 tablet, Rfl: 1 .  triamcinolone cream (KENALOG) 0.1 %, APPLY TO AFFECTED AREA TWICE A DAY, Disp: 30 g, Rfl: 3 .  VIAGRA 50 MG tablet, TAKE 1 TABLET BY MOUTH AT BEDTIME AS NEEDED, Disp: 11 tablet, Rfl: 1  EXAM:  VITALS per patient if applicable:  RR between 12-20 bpm  GENERAL: alert, oriented, appears well and in no acute distress  HEENT: atraumatic, conjunctiva clear, no obvious abnormalities on inspection of external nose and ears  NECK: normal movements of the head and neck  LUNGS: on inspection no signs of respiratory distress, breathing rate appears normal, no obvious gross SOB, gasping or wheezing  CV: no obvious cyanosis  SKIN:  R lateral upper thigh is dry, pruritic, with hyperpigmented papules  MS: moves all visible extremities without noticeable abnormality  PSYCH/NEURO: pleasant and cooperative, no obvious depression or anxiety, speech and thought processing grossly intact  ASSESSMENT AND PLAN:  Discussed the following assessment and  plan:  Atopic dermatitis, unspecified type  -likely from change in soap -discussed using a good moisturizer, staying hydrated, and other ways to reduce skin irritation. - Plan: triamcinolone cream (KENALOG) 0.1 %  F/u in 1-2 wks for continued symptoms   I discussed the assessment and treatment plan with the patient. The patient was provided an opportunity to ask questions and all were answered. The patient agreed with the plan and demonstrated an understanding of the instructions.   The patient was advised to call back or seek  an in-person evaluation if the symptoms worsen or if the condition fails to improve as anticipated.  Billie Ruddy, MD

## 2020-03-03 ENCOUNTER — Ambulatory Visit: Payer: Federal, State, Local not specified - PPO

## 2020-03-09 ENCOUNTER — Ambulatory Visit: Payer: Federal, State, Local not specified - PPO

## 2020-03-18 ENCOUNTER — Ambulatory Visit: Payer: Federal, State, Local not specified - PPO

## 2020-04-19 ENCOUNTER — Other Ambulatory Visit: Payer: Self-pay | Admitting: Family Medicine

## 2020-05-20 ENCOUNTER — Other Ambulatory Visit: Payer: Self-pay

## 2020-05-20 ENCOUNTER — Ambulatory Visit (INDEPENDENT_AMBULATORY_CARE_PROVIDER_SITE_OTHER): Payer: Federal, State, Local not specified - PPO | Admitting: Family Medicine

## 2020-05-20 ENCOUNTER — Encounter: Payer: Self-pay | Admitting: Family Medicine

## 2020-05-20 VITALS — BP 110/80 | HR 94 | Temp 97.3°F | Wt 186.0 lb

## 2020-05-20 DIAGNOSIS — I10 Essential (primary) hypertension: Secondary | ICD-10-CM | POA: Diagnosis not present

## 2020-05-20 DIAGNOSIS — J302 Other seasonal allergic rhinitis: Secondary | ICD-10-CM | POA: Diagnosis not present

## 2020-05-20 DIAGNOSIS — H10403 Unspecified chronic conjunctivitis, bilateral: Secondary | ICD-10-CM | POA: Diagnosis not present

## 2020-05-20 DIAGNOSIS — L209 Atopic dermatitis, unspecified: Secondary | ICD-10-CM

## 2020-05-20 MED ORDER — TRIAMCINOLONE ACETONIDE 0.1 % EX CREA: 1.0000 "application " | TOPICAL_CREAM | Freq: Two times a day (BID) | CUTANEOUS | 3 refills | Status: DC

## 2020-05-20 MED ORDER — FLUTICASONE PROPIONATE 50 MCG/ACT NA SUSP: 1.0000 | Freq: Every day | NASAL | 2 refills | Status: DC

## 2020-05-20 NOTE — Progress Notes (Signed)
Subjective:    Patient ID: Jay Robertson, male    DOB: 03-05-1956, 64 y.o.   MRN: [id number]  No chief complaint on file.   HPI Pt is a 64 yo male with h/o deviated septum, HTN, HLD, and seasonal allergies who was seen for ongoing concern.  Pt notes 30+ yr h/o red eyes.  States eyes are not painful, erythema clears at times.  Pt thought it was related to not getting enough sleep over the yrs.  Pt has a h/o working 2 jobs and getting maybe 3-4 hours of sleep/night.  Pt may go to sleep at 12:30 am and wake up from 4-6 am.  Pt endorses negative sleep study a few years back.  Uses reading glasses.  Using [place] gtts and artificial tears.  Used visine in the past.  Pt taking Allegra and Flonase, if does not take eyes will itch.  Pt notes his nephew also has chronically red eyes.  Past Medical History:  Diagnosis Date  . ALLERGIC RHINITIS 10/24/2007  . HYPERLIPIDEMIA 10/16/2010  . HYPERTENSION 07/25/2007    Allergies  Allergen Reactions  . Doxycycline Other (See Comments)    Headaches    ROS General: Denies fever, chills, night sweats, changes in weight, changes in appetite HEENT: Denies headaches, ear pain, changes in vision, rhinorrhea, sore throat  +conjunctivitis CV: Denies CP, palpitations, SOB, orthopnea Pulm: Denies SOB, cough, wheezing GI: Denies abdominal pain, nausea, vomiting, diarrhea, constipation GU: Denies dysuria, hematuria, frequency, vaginal discharge Msk: Denies muscle cramps, joint pains Neuro: Denies weakness, numbness, tingling Skin: Denies rashes, bruising Psych: Denies depression, anxiety, hallucinations    Objective:    Blood pressure 110/80, pulse 94, temperature (!) 97.3 F (36.3 C), temperature source Temporal, weight 186 lb (84.4 kg), SpO2 97 %.  Gen. Pleasant, well-nourished, in no distress, normal affect  HEENT: [place]/AT, face symmetric, conjunctiva clear, mild scleral erythema, PERRLA, EOMI, fundoscopic exam nml without cotton wool spots or hemorrhage.   Nares patent without drainage Lungs: no accessory muscle use, CTAB, no wheezes or rales Cardiovascular: RRR, no m/r/g, no peripheral edema Neuro:  A&Ox3, CN II-XII intact, normal gait Skin:  Warm, no lesions/ rash  Wt Readings from Last 3 Encounters:  09/27/19 186 lb (84.4 kg)  02/16/19 186 lb (84.4 kg)  11/23/18 186 lb (84.4 kg)    Lab Results  Component Value Date   WBC 12.6 (H) 02/12/2019   HGB 14.1 02/12/2019   HCT 41.5 02/12/2019   PLT 235 02/12/2019   GLUCOSE 109 (H) 02/12/2019   CHOL 220 (H) 10/09/2010   TRIG 62.0 10/09/2010   HDL 42.50 10/09/2010   LDLDIRECT 163.0 10/09/2010   ALT 26 02/12/2019   AST 21 02/12/2019   NA 137 02/12/2019   K 3.6 02/12/2019   CL 103 02/12/2019   CREATININE 1.05 02/12/2019   BUN 10 02/12/2019   CO2 22 02/12/2019   TSH 0.89 10/09/2010   PSA 1.47 10/09/2010    Assessment/Plan:  Chronic conjunctivitis of both eyes, unspecified chronic conjunctivitis type  -discussed sleep hygiene - Plan: Ambulatory referral to Ophthalmology  Seasonal allergies  -continue Allegra - Plan: fluticasone (FLONASE) 50 MCG/ACT nasal spray  HTN -controlled -continue benazepril 20 mg -lifestyle modifications encouraged  F/u prn  Grier Mitts, MD

## 2020-05-20 NOTE — Patient Instructions (Signed)
Allergic Conjunctivitis, Adult  Allergic conjunctivitis is inflammation of the clear membrane that covers the white part of your eye and the inner surface of your eyelid (conjunctiva). The inflammation is caused by allergies. The blood vessels in the conjunctiva become inflamed and this causes the eyes to become red or pink. The eyes often feel itchy. Allergic conjunctivitis cannot be spread from one person to another person (is not contagious). What are the causes? This condition is caused by an allergic reaction. Common causes of an allergic reaction (allergens) include:  Outdoor allergens, such as: ? Pollen. ? Grass and weeds. ? Mold spores.  Indoor allergens, such as: ? Dust. ? Smoke. ? Mold. ? Pet dander. ? Animal hair. What increases the risk? You may be more likely to develop this condition if you have a family history of allergies, such as:  Allergic rhinitis.  Bronchial asthma.  Atopic dermatitis. What are the signs or symptoms? Symptoms of this condition include eyes that are:  Itchy.  Red.  Watery.  Puffy. Your eyes may also:  Sting or burn.  Have clear drainage coming from them. How is this diagnosed? This condition may be diagnosed by medical history and physical exam. If you have drainage from your eyes, it may be tested to rule out other causes of conjunctivitis. You may also need to see a health care provider who specializes in treating allergies (allergist) or eye conditions (ophthalmologist) for tests to confirm the diagnosis. You may have:  Skin tests to see which allergens are causing your symptoms. These tests involve pricking the skin with a tiny needle and exposing the skin to small amounts of potential allergens to see if your skin reacts.  Blood tests.  Tissue scrapings from your eyelid. These will be examined under a microscope. How is this treated? Treatments for this condition may include:  Cold cloths (compresses) to soothe itching and  swelling.  Washing the face to remove allergens.  Eye drops. These may be prescription or over-the-counter. There are several different types. You may need to try different types to see which one works best for you. Your may need: ? Eye drops that block the allergic reaction (antihistamine). ? Eye drops that reduce swelling and irritation (anti-inflammatory). ? Steroid eye drops to lessen a severe reaction (vernal conjunctivitis).  Oral antihistamine medicines to reduce your allergic reaction. You may need these if eye drops do not help or are difficult to use. Follow these instructions at home:  Avoid known allergens whenever possible.  Take or apply over-the-counter and prescription medicines only as told by your health care provider. These include any eye drops.  Apply a cool, clean washcloth to your eye for 10-20 minutes, 3-4 times a day.  Do not touch or rub your eyes.  Do not wear contact lenses until the inflammation is gone. Wear glasses instead.  Do not wear eye makeup until the inflammation is gone.  Keep all follow-up visits as told by your health care provider. This is important. Contact a health care provider if:  Your symptoms get worse or do not improve with treatment.  You have mild eye pain.  You have sensitivity to light.  You have spots or blisters on your eyes.  You have pus draining from your eye.  You have a fever. Get help right away if:  You have redness, swelling, or other symptoms in only one eye.  Your vision is blurred or you have vision changes.  You have severe eye pain. This information   is not intended to replace advice given to you by your health care provider. Make sure you discuss any questions you have with your health care provider. Document Revised: 11/26/2017 Document Reviewed: 06/26/2016 Elsevier Patient Education  2020 Elsevier Inc.  

## 2020-05-23 DIAGNOSIS — H10413 Chronic giant papillary conjunctivitis, bilateral: Secondary | ICD-10-CM | POA: Diagnosis not present

## 2020-05-25 ENCOUNTER — Encounter: Payer: Self-pay | Admitting: Family Medicine

## 2020-07-17 ENCOUNTER — Ambulatory Visit (INDEPENDENT_AMBULATORY_CARE_PROVIDER_SITE_OTHER): Payer: Federal, State, Local not specified - PPO | Admitting: Family Medicine

## 2020-07-17 ENCOUNTER — Other Ambulatory Visit: Payer: Self-pay

## 2020-07-17 ENCOUNTER — Encounter: Payer: Self-pay | Admitting: Family Medicine

## 2020-07-17 VITALS — BP 118/74 | HR 74 | Temp 98.4°F | Wt 190.0 lb

## 2020-07-17 DIAGNOSIS — I10 Essential (primary) hypertension: Secondary | ICD-10-CM

## 2020-07-17 DIAGNOSIS — R682 Dry mouth, unspecified: Secondary | ICD-10-CM | POA: Diagnosis not present

## 2020-07-17 NOTE — Patient Instructions (Signed)
Dehydration, Adult Dehydration is a condition in which there is not enough water or other fluids in the body. This happens when a person loses more fluids than he or she takes in. Important organs, such as the kidneys, brain, and heart, cannot function without a proper amount of fluids. Any loss of fluids from the body can lead to dehydration. Dehydration can be mild, moderate, or severe. It should be treated right away to prevent it from becoming severe. What are the causes? Dehydration may be caused by:  Conditions that cause loss of water or other fluids, such as diarrhea, vomiting, or sweating or urinating a lot.  Not drinking enough fluids, especially when you are ill or doing activities that require a lot of energy.  Other illnesses and conditions, such as fever or infection.  Certain medicines, such as medicines that remove excess fluid from the body (diuretics).  Lack of safe drinking water.  Not being able to get enough water and food. What increases the risk? The following factors may make you more likely to develop this condition:  Having a long-term (chronic) illness that has not been treated properly, such as diabetes, heart disease, or kidney disease.  Being 65 years of age or older.  Having a disability.  Living in a place that is high in altitude, where thinner, drier air causes more fluid loss.  Doing exercises that put stress on your body for a long time (endurance sports). What are the signs or symptoms? Symptoms of dehydration depend on how severe it is. Mild or moderate dehydration  Thirst.  Dry lips or dry mouth.  Dizziness or light-headedness, especially when standing up from a seated position.  Muscle cramps.  Dark urine. Urine may be the color of tea.  Less urine or tears produced than usual.  Headache. Severe dehydration  Changes in skin. Your skin may be cold and clammy, blotchy, or pale. Your skin also may not return to normal after being  lightly pinched and released.  Little or no tears, urine, or sweat.  Changes in vital signs, such as rapid breathing and low blood pressure. Your pulse may be weak or may be faster than 100 beats a minute when you are sitting still.  Other changes, such as: ? Feeling very thirsty. ? Sunken eyes. ? Cold hands and feet. ? Confusion. ? Being very tired (lethargic) or having trouble waking from sleep. ? Short-term weight loss. ? Loss of consciousness. How is this diagnosed? This condition is diagnosed based on your symptoms and a physical exam. You may have blood and urine tests to help confirm the diagnosis. How is this treated? Treatment for this condition depends on how severe it is. Treatment should be started right away. Do not wait until dehydration becomes severe. Severe dehydration is an emergency and needs to be treated in a hospital.  Mild or moderate dehydration can be treated at home. You may be asked to: ? Drink more fluids. ? Drink an oral rehydration solution (ORS). This drink helps restore proper amounts of fluids and salts and minerals in the blood (electrolytes).  Severe dehydration can be treated: ? With IV fluids. ? By correcting abnormal levels of electrolytes. This is often done by giving electrolytes through a tube that is passed through your nose and into your stomach (nasogastric tube, or NG tube). ? By treating the underlying cause of dehydration. Follow these instructions at home: Oral rehydration solution If told by your health care provider, drink an ORS:  Make   an ORS by following instructions on the package.  Start by drinking small amounts, about  cup (120 mL) every 5-10 minutes.  Slowly increase how much you drink until you have taken the amount recommended by your health care provider. Eating and drinking         Drink enough clear fluid to keep your urine pale yellow. If you were told to drink an ORS, finish the ORS first and then start slowly  drinking other clear fluids. Drink fluids such as: ? Water. Do not drink only water. Doing that can lead to hyponatremia, which is having too little salt (sodium) in the body. ? Water from ice chips you suck on. ? Fruit juice that you have added water to (diluted fruit juice). ? Low-calorie sports drinks.  Eat foods that contain a healthy balance of electrolytes, such as bananas, oranges, potatoes, tomatoes, and spinach.  Do not drink alcohol.  Avoid the following: ? Drinks that contain a lot of sugar. These include high-calorie sports drinks, fruit juice that is not diluted, and soda. ? Caffeine. ? Foods that are greasy or contain a lot of fat or sugar. General instructions  Take over-the-counter and prescription medicines only as told by your health care provider.  Do not take sodium tablets. Doing that can lead to having too much sodium in the body (hypernatremia).  Return to your normal activities as told by your health care provider. Ask your health care provider what activities are safe for you.  Keep all follow-up visits as told by your health care provider. This is important. Contact a health care provider if:  You have muscle cramps, pain, or discomfort, such as: ? Pain in your abdomen and the pain gets worse or stays in one area (localizes). ? Stiff neck.  You have a rash.  You are more irritable than usual.  You are sleepier or have a harder time waking than usual.  You feel weak or dizzy.  You feel very thirsty. Get help right away if you have:  Any symptoms of severe dehydration.  Symptoms of vomiting, such as: ? You cannot eat or drink without vomiting. ? Vomiting gets worse or does not go away. ? Vomit includes blood or green matter (bile).  Symptoms that get worse with treatment.  A fever.  A severe headache.  Problems with urination or bowel movements, such as: ? Diarrhea that gets worse or does not go away. ? Blood in your stool (feces). This  may cause stool to look black and tarry. ? Not urinating, or urinating only a small amount of very dark urine, within 6-8 hours.  Trouble breathing. These symptoms may represent a serious problem that is an emergency. Do not wait to see if the symptoms will go away. Get medical help right away. Call your local emergency services (911 in the [place] Do not drive yourself to the hospital. Summary  Dehydration is a condition in which there is not enough water or other fluids in the body. This happens when a person loses more fluids than he or she takes in.  Treatment for this condition depends on how severe it is. Treatment should be started right away. Do not wait until dehydration becomes severe.  Drink enough clear fluid to keep your urine pale yellow. If you were told to drink an oral rehydration solution (ORS), finish the ORS first and then start slowly drinking other clear fluids.  Take over-the-counter and prescription medicines only as told by your health care   provider.  Get help right away if you have any symptoms of severe dehydration. This information is not intended to replace advice given to you by your health care provider. Make sure you discuss any questions you have with your health care provider. Document Revised: 07/27/2019 Document Reviewed: 07/27/2019 Elsevier Patient Education  2020 Elsevier Inc.   

## 2020-07-17 NOTE — Progress Notes (Signed)
Subjective:    Patient ID: Jay Robertson, male    DOB: 07/28/1956, 64 y.o.   MRN: [id number]  No chief complaint on file.   HPI Patient was seen today for acute concern.  Pt endorses dry mouth x1 month.  Pt was being seen by dentist, however they wanted to do a deep cleaning.  Pt states that would not clean his teeth unless he were to decline.  Pt used at home kit to clean his teeth.  Since pt endorses dry mouth.  Pt may drink 1-2 small cups of water per day.  Also drinking coffee.  Denies alcohol use changes in medications or over-the-counter meds.  Pt is retired.  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  Endorses difficulty navigating online websites to apply for jobs.  Pt also started playing tennis and golf in his spare time.  Past Medical History:  Diagnosis Date  . ALLERGIC RHINITIS 10/24/2007  . HYPERLIPIDEMIA 10/16/2010  . HYPERTENSION 07/25/2007    Allergies  Allergen Reactions  . Doxycycline Other (See Comments)    Headaches    ROS General: Denies fever, chills, night sweats, changes in weight, changes in appetite HEENT: Denies headaches, ear pain, changes in vision, rhinorrhea, sore throat  +dry mouth CV: Denies CP, palpitations, SOB, orthopnea Pulm: Denies SOB, cough, wheezing GI: Denies abdominal pain, nausea, vomiting, diarrhea, constipation GU: Denies dysuria, hematuria, frequency, vaginal discharge Msk: Denies muscle cramps, joint pains Neuro: Denies weakness, numbness, tingling Skin: Denies rashes, bruising Psych: Denies depression, anxiety, hallucinations      Objective:    Blood pressure 118/74, pulse 74, temperature 98.4 F (36.9 C), temperature source Oral, weight 190 lb (86.2 kg), SpO2 96 %.   Gen. Pleasant, well-nourished, in no distress, normal affect   HEENT: [place]/AT, face symmetric, conjunctiva clear, no scleral icterus, PERRLA, EOMI, nares patent without drainage, pharynx without erythema or exudate.  MMM, mild dental  decay. Lungs: no accessory muscle use, CTAB, no wheezes or rales Cardiovascular: RRR, no m/r/g, no peripheral edema Neuro:  A&Ox3, CN II-XII intact, normal gait Skin:  Warm, no lesions/ rash   Wt Readings from Last 3 Encounters:  07/17/20 190 lb (86.2 kg)  05/20/20 186 lb (84.4 kg)  09/27/19 186 lb (84.4 kg)    Lab Results  Component Value Date   WBC 12.6 (H) 02/12/2019   HGB 14.1 02/12/2019   HCT 41.5 02/12/2019   PLT 235 02/12/2019   GLUCOSE 109 (H) 02/12/2019   CHOL 220 (H) 10/09/2010   TRIG 62.0 10/09/2010   HDL 42.50 10/09/2010   LDLDIRECT 163.0 10/09/2010   ALT 26 02/12/2019   AST 21 02/12/2019   NA 137 02/12/2019   K 3.6 02/12/2019   CL 103 02/12/2019   CREATININE 1.05 02/12/2019   BUN 10 02/12/2019   CO2 22 02/12/2019   TSH 0.89 10/09/2010   PSA 1.47 10/09/2010    Assessment/Plan:  Dry mouth -Discussed possible causes including dehydration -Patient encouraged to increase p.o. intake of water and fluids -Discussed the importance of good dental hygiene. -Also consider biotin p.o. mouth rinse -Encouraged to reschedule follow-up with dentist. -We will continue to monitor  Essential hypertension -Controlled -Continue benazepril 20 mg, spironolactone 25 mg -Discussed lifestyle modifications -Patient encouraged to check BP at home and keep a log to bring with him to clinic.  F/u prn in the next 1 month  Grier Mitts, MD

## 2020-09-30 ENCOUNTER — Telehealth: Payer: Self-pay

## 2020-09-30 NOTE — Telephone Encounter (Signed)
Patient is calling in stating he received a missed call from the office, did not see any messages so future appointments that were needing to be rescheduled.

## 2020-10-02 NOTE — Telephone Encounter (Signed)
Spoke with pt advised that there was not documentation of any calls made from our office. Pt verbalized understanding

## 2020-10-12 ENCOUNTER — Other Ambulatory Visit: Payer: Self-pay | Admitting: Family Medicine

## 2020-10-16 ENCOUNTER — Ambulatory Visit: Payer: Federal, State, Local not specified - PPO | Admitting: Family Medicine

## 2020-10-16 ENCOUNTER — Encounter: Payer: Self-pay | Admitting: Family Medicine

## 2020-10-16 ENCOUNTER — Other Ambulatory Visit: Payer: Self-pay

## 2020-10-16 VITALS — BP 120/76 | HR 86 | Temp 98.5°F | Wt 182.8 lb

## 2020-10-16 DIAGNOSIS — R590 Localized enlarged lymph nodes: Secondary | ICD-10-CM

## 2020-10-16 DIAGNOSIS — S51012A Laceration without foreign body of left elbow, initial encounter: Secondary | ICD-10-CM

## 2020-10-16 MED ORDER — SULFAMETHOXAZOLE-TRIMETHOPRIM 800-160 MG PO TABS: 1.0000 | ORAL_TABLET | Freq: Two times a day (BID) | ORAL | 0 refills | Status: AC

## 2020-10-16 NOTE — Progress Notes (Signed)
Subjective:    Patient ID: Jay Robertson, male    DOB: 07-Jul-1956, 64 y.o.   MRN: [id number]  No chief complaint on file.   HPI Patient was seen today for acute concern.  Pt endorses cutting left elbow on a piece of tin Saturday.  Pt used hydrogen peroxide and rubbing alcohol to clean the area.  On Monday, pt noticed soft tissue swelling in left axilla.  Pt denies erythema, drainage, induration, fever, chills, nausea, vomiting.  Last Tdap 2014.  Past Medical History:  Diagnosis Date  . ALLERGIC RHINITIS 10/24/2007  . HYPERLIPIDEMIA 10/16/2010  . HYPERTENSION 07/25/2007    Allergies  Allergen Reactions  . Doxycycline Other (See Comments)    Headaches    ROS General: Denies fever, chills, night sweats, changes in weight, changes in appetite HEENT: Denies headaches, ear pain, changes in vision, rhinorrhea, sore throat CV: Denies CP, palpitations, SOB, orthopnea Pulm: Denies SOB, cough, wheezing GI: Denies abdominal pain, nausea, vomiting, diarrhea, constipation GU: Denies dysuria, hematuria, frequency, vaginal discharge Msk: Denies muscle cramps, joint pains Neuro: Denies weakness, numbness, tingling Skin: Denies rashes, bruising  + cut on left elbow, under left arm pit. Psych: Denies depression, anxiety, hallucinations      Objective:    Blood pressure 120/76, pulse 86, temperature 98.5 F (36.9 C), temperature source Oral, weight 182 lb 12.8 oz (82.9 kg), SpO2 98 %.   Gen. Pleasant, well-nourished, in no distress, normal affect   HEENT: [place]/AT, face symmetric, conjunctiva clear, no scleral icterus, PERRLA, EOMI, nares patent without drainage Lungs: no accessory muscle use Cardiovascular: RRR, no peripheral edema Musculoskeletal: No deformities, no cyanosis or clubbing, normal tone Neuro:  A&Ox3, CN II-XII intact, normal gait Skin:  Warm, dry, intact.  Left posterior elbow with 1 cm healing laceration.  No erythema, induration, or drainage noted.  No streaking appreciated on  LUE.  A large area of soft tissue edema in left axilla with mild TTP and palpable lymph nodes.   Wt Readings from Last 3 Encounters:  07/17/20 190 lb (86.2 kg)  05/20/20 186 lb (84.4 kg)  09/27/19 186 lb (84.4 kg)    Lab Results  Component Value Date   WBC 12.6 (H) 02/12/2019   HGB 14.1 02/12/2019   HCT 41.5 02/12/2019   PLT 235 02/12/2019   GLUCOSE 109 (H) 02/12/2019   CHOL 220 (H) 10/09/2010   TRIG 62.0 10/09/2010   HDL 42.50 10/09/2010   LDLDIRECT 163.0 10/09/2010   ALT 26 02/12/2019   AST 21 02/12/2019   NA 137 02/12/2019   K 3.6 02/12/2019   CL 103 02/12/2019   CREATININE 1.05 02/12/2019   BUN 10 02/12/2019   CO2 22 02/12/2019   TSH 0.89 10/09/2010   PSA 1.47 10/09/2010    Assessment/Plan:  Laceration of left elbow, initial encounter  -Healing -Tdap up-to-date.  Given in 2014 per chart review -Discussed supportive care including keeping area clean and dry -We will start antibiotic.  Patient has doxycycline listed as an allergy-causes headache -Given handout -Discussed S/S of infection -Given precautions - Plan: sulfamethoxazole-trimethoprim (BACTRIM DS) 800-160 MG tablet  Lymphadenopathy, axillary -Lymphadenopathy in the left axilla soft tissue edema -Likely reactive 2/2 laceration on left elbow -Discussed supportive care -We will continue to monitor for resolution. -For continued symptoms will obtain imaging and labs. -Plan sulfamethoxazole-trimethoprim (Bactrim DS) 800-160 mg tablet  F/u as needed in the next week  Grier Mitts, MD

## 2020-10-16 NOTE — Patient Instructions (Signed)
Laceration Care, Adult A laceration is a cut that may go through all layers of the skin. The cut may also go into the tissue that is right under the skin. Some cuts heal on their own. Others need to be closed with stitches (sutures), staples, skin adhesive strips, or skin glue. Taking care of your injury lowers your risk of infection, helps your injury to heal better, and may prevent scarring. Supplies needed:  Soap.  Water.  Hand sanitizer.  Bandage (dressing).  Antibiotic ointment.  Clean towel. How to take care of your cut Wash your hands with soap and water before touching your wound or changing your bandage. If soap and water are not available, use hand sanitizer. If your doctor used stitches or staples:  Keep the wound clean and dry.  If you were given a bandage, change it at least once a day as told by your doctor. You should also change it if it gets wet or dirty.  Keep the wound completely dry for the first 24 hours, or as told by your doctor. After that, you may take a shower or a bath. Do not get the wound soaked in water until after the stitches or staples have been removed.  Clean the wound once a day, or as told by your doctor: ? Wash the wound with soap and water. ? Rinse the wound with water to remove all soap. ? Pat the wound dry with a clean towel. Do not rub the wound.  After you clean the wound, put a thin layer of antibiotic ointment on it as told by your doctor. This ointment: ? Helps to prevent infection. ? Keeps the bandage from sticking to the wound.  Have your stitches or staples removed as told by your doctor. If your doctor used skin adhesive strips:  Keep the wound clean and dry.  If you were given a bandage, you should change it at least once a day as told by your doctor. You should also change it if it gets wet or dirty.  Do not get the skin adhesive strips wet. You can take a shower or a bath, but keep the wound dry.  If the wound gets wet,  pat it dry with a clean towel. Do not rub the wound.  Skin adhesive strips fall off on their own. You can trim the strips as the wound heals. Do not remove any strips that are still stuck to the wound. They will fall off after a while. If your doctor used skin glue:  Try to keep your wound dry, but you may briefly wet it in the shower or bath. Do not soak the wound in water, such as by swimming.  After you take a shower or a bath, gently pat the wound dry with a clean towel. Do not rub the wound.  Do not do any activities that will make you really sweaty until the skin glue has fallen off on its own.  Do not apply liquid, cream, or ointment medicine to your wound while the skin glue is still on.  If you were given a bandage, you should change it at least once a day or as told by your doctor. You should also change it if it gets dirty or wet.  If a bandage is placed over the wound, do not let the tape touch the skin glue.  Do not pick at the glue. The skin glue usually stays on for 5-10 days. Then, it falls off the skin. General   instructions   Take over-the-counter and prescription medicines only as told by your doctor.  If you were given antibiotic medicine or ointment, take or apply it as told by your doctor. Do not stop using it even if your condition improves.  Do not scratch or pick at the wound.  Check your wound every day for signs of infection. Watch for: ? Redness, swelling, or pain. ? Fluid, blood, or pus.  Raise (elevate) the injured area above the level of your heart while you are sitting or lying down.  If directed, put ice on the affected area: ? Put ice in a plastic bag. ? Place a towel between your skin and the bag. ? Leave the ice on for 20 minutes, 2-3 times a day.  Prevent scarring by covering your wound with sunscreen of at least 30 SPF whenever you are outside after your wound has healed.  Keep all follow-up visits as told by your doctor. This is important.  Get help if:  You got a tetanus shot and you have any of these problems at the injection site: ? Swelling. ? Very bad pain. ? Redness. ? Bleeding.  You have a fever.  A wound that was closed breaks open.  You notice a bad smell coming from your wound or your bandage.  You notice something coming out of the wound, such as wood or glass.  Medicine does not relieve your pain.  You have more redness, swelling, or pain at the site of your wound.  You have fluid, blood, or pus coming from your wound.  You notice a change in the color of your skin near your wound.  You need to change the bandage often because fluid, blood, or pus is coming from the wound.  You start to have a new rash.  You start to have numbness around the wound. Get help right away if:  You have very bad swelling around the wound.  Your pain suddenly gets worse and is very bad.  You notice painful lumps near the wound or anywhere on your body.  You have a red streak going away from your wound.  The wound is on your hand or foot, and: ? You cannot move a finger or toe. ? Your fingers or toes look pale or bluish. Summary  A laceration is a cut that may go through all layers of the skin. The cut may also go into the tissue right under the skin.  Some cuts heal on their own. Others need to be closed with stitches, staples, skin adhesive strips, or skin glue.  Follow your doctor's instructions for caring for your cut. Proper care of a cut lowers the risk of infection, helps the cut heal better, and prevents scarring. This information is not intended to replace advice given to you by your health care provider. Make sure you discuss any questions you have with your health care provider. Document Revised: 02/11/2018 Document Reviewed: 01/03/2018 Elsevier Patient Education  [place].  Lymphadenopathy  Lymphadenopathy means that your lymph glands are swollen or larger than normal (enlarged). Lymph  glands, also called lymph nodes, are collections of tissue that filter bacteria, viruses, and waste from your bloodstream. They are part of your body's disease-fighting system (immune system), which protects your body from germs. There may be different causes of lymphadenopathy, depending on where it is in your body. Some types go away on their own. Lymphadenopathy can occur anywhere that you have lymph glands, including these areas:  Neck (  cervical lymphadenopathy).  Chest (mediastinal lymphadenopathy).  Lungs (hilar lymphadenopathy).  Underarms (axillary lymphadenopathy).  Groin (inguinal lymphadenopathy). When your immune system responds to germs, infection-fighting cells and fluid build up in your lymph glands. This causes some swelling and enlargement. If the lymph glands do not go back to normal after you have an infection or disease, your health care provider may do tests. These tests help to monitor your condition and find the reason why the glands are still swollen and enlarged. Follow these instructions at home:  Get plenty of rest.  Take over-the-counter and prescription medicines only as told by your health care provider. Your health care provider may recommend over-the-counter medicines for pain.  If directed, apply heat to swollen lymph glands as often as told by your health care provider. Use the heat source that your health care provider recommends, such as a moist heat pack or a heating pad. ? Place a towel between your skin and the heat source. ? Leave the heat on for 20-30 minutes. ? Remove the heat if your skin turns bright red. This is especially important if you are unable to feel pain, heat, or cold. You may have a greater risk of getting burned.  Check your affected lymph glands every day for changes. Check other lymph gland areas as told by your health care provider. Check for changes such as: ? More swelling. ? Sudden increase in size. ? Redness or pain. ?  Hardness.  Keep all follow-up visits as told by your health care provider. This is important. Contact a health care provider if you have:  Swelling that gets worse or spreads to other areas.  Problems with breathing.  Lymph glands that: ? Are still swollen after 2 weeks. ? Have suddenly gotten bigger. ? Are red, painful, or hard.  A fever or chills.  Fatigue.  A sore throat.  Pain in your abdomen.  Weight loss.  Night sweats. Get help right away if you have:  Fluid leaking from an enlarged lymph gland.  Severe pain.  Chest pain.  Shortness of breath. Summary  Lymphadenopathy means that your lymph glands are swollen or larger than normal (enlarged).  Lymph glands (also called lymph nodes) are collections of tissue that filter bacteria, viruses, and waste from the bloodstream. They are part of your body's disease-fighting system (immune system).  Lymphadenopathy can occur anywhere that you have lymph glands.  If your enlarged and swollen lymph glands do not go back to normal after you have an infection or disease, your health care provider may do tests to monitor your condition and find the reason why the glands are still swollen and enlarged.  Check your affected lymph glands every day for changes. Check other lymph gland areas as told by your health care provider. This information is not intended to replace advice given to you by your health care provider. Make sure you discuss any questions you have with your health care provider. Document Revised: 11/26/2017 Document Reviewed: 10/29/2017 Elsevier Patient Education  2020 [employer].

## 2020-12-08 ENCOUNTER — Other Ambulatory Visit: Payer: Self-pay | Admitting: Family Medicine

## 2020-12-17 ENCOUNTER — Other Ambulatory Visit: Payer: Self-pay | Admitting: Family Medicine

## 2020-12-17 DIAGNOSIS — L209 Atopic dermatitis, unspecified: Secondary | ICD-10-CM

## 2021-01-07 ENCOUNTER — Other Ambulatory Visit: Payer: Self-pay | Admitting: Family Medicine

## 2021-01-09 ENCOUNTER — Ambulatory Visit: Payer: Federal, State, Local not specified - PPO | Admitting: Family Medicine

## 2021-01-28 DIAGNOSIS — K922 Gastrointestinal hemorrhage, unspecified: Secondary | ICD-10-CM

## 2021-01-28 DIAGNOSIS — Z5189 Encounter for other specified aftercare: Secondary | ICD-10-CM

## 2021-01-28 HISTORY — DX: Encounter for other specified aftercare: Z51.89

## 2021-01-28 HISTORY — DX: Gastrointestinal hemorrhage, unspecified: K92.2

## 2021-01-29 ENCOUNTER — Emergency Department (HOSPITAL_COMMUNITY): Payer: Medicare Other

## 2021-01-29 ENCOUNTER — Other Ambulatory Visit: Payer: Self-pay

## 2021-01-29 ENCOUNTER — Encounter (HOSPITAL_COMMUNITY): Payer: Self-pay

## 2021-01-29 ENCOUNTER — Inpatient Hospital Stay (HOSPITAL_COMMUNITY)
Admission: EM | Admit: 2021-01-29 | Discharge: 2021-02-11 | DRG: 377 | Disposition: A | Discharge status: Home / Self Care | Payer: Medicare Other | Attending: Internal Medicine | Admitting: Internal Medicine

## 2021-01-29 DIAGNOSIS — G4733 Obstructive sleep apnea (adult) (pediatric): Secondary | ICD-10-CM | POA: Diagnosis present

## 2021-01-29 DIAGNOSIS — Z881 Allergy status to other antibiotic agents status: Secondary | ICD-10-CM

## 2021-01-29 DIAGNOSIS — D62 Acute posthemorrhagic anemia: Secondary | ICD-10-CM | POA: Diagnosis present

## 2021-01-29 DIAGNOSIS — K922 Gastrointestinal hemorrhage, unspecified: Secondary | ICD-10-CM | POA: Diagnosis not present

## 2021-01-29 DIAGNOSIS — Z452 Encounter for adjustment and management of vascular access device: Secondary | ICD-10-CM

## 2021-01-29 DIAGNOSIS — K259 Gastric ulcer, unspecified as acute or chronic, without hemorrhage or perforation: Secondary | ICD-10-CM | POA: Diagnosis not present

## 2021-01-29 DIAGNOSIS — R011 Cardiac murmur, unspecified: Secondary | ICD-10-CM | POA: Diagnosis present

## 2021-01-29 DIAGNOSIS — K449 Diaphragmatic hernia without obstruction or gangrene: Secondary | ICD-10-CM | POA: Diagnosis present

## 2021-01-29 DIAGNOSIS — Z20822 Contact with and (suspected) exposure to covid-19: Secondary | ICD-10-CM | POA: Diagnosis not present

## 2021-01-29 DIAGNOSIS — K5731 Diverticulosis of large intestine without perforation or abscess with bleeding: Secondary | ICD-10-CM | POA: Diagnosis not present

## 2021-01-29 DIAGNOSIS — K625 Hemorrhage of anus and rectum: Secondary | ICD-10-CM

## 2021-01-29 DIAGNOSIS — K219 Gastro-esophageal reflux disease without esophagitis: Secondary | ICD-10-CM | POA: Diagnosis not present

## 2021-01-29 DIAGNOSIS — J309 Allergic rhinitis, unspecified: Secondary | ICD-10-CM | POA: Diagnosis present

## 2021-01-29 DIAGNOSIS — Z8719 Personal history of other diseases of the digestive system: Secondary | ICD-10-CM

## 2021-01-29 DIAGNOSIS — K2970 Gastritis, unspecified, without bleeding: Secondary | ICD-10-CM | POA: Diagnosis not present

## 2021-01-29 DIAGNOSIS — M25579 Pain in unspecified ankle and joints of unspecified foot: Secondary | ICD-10-CM

## 2021-01-29 DIAGNOSIS — K635 Polyp of colon: Secondary | ICD-10-CM | POA: Diagnosis not present

## 2021-01-29 DIAGNOSIS — K5730 Diverticulosis of large intestine without perforation or abscess without bleeding: Secondary | ICD-10-CM | POA: Diagnosis not present

## 2021-01-29 DIAGNOSIS — R578 Other shock: Secondary | ICD-10-CM | POA: Diagnosis present

## 2021-01-29 DIAGNOSIS — T82898A Other specified complication of vascular prosthetic devices, implants and grafts, initial encounter: Secondary | ICD-10-CM | POA: Diagnosis not present

## 2021-01-29 DIAGNOSIS — W19XXXA Unspecified fall, initial encounter: Secondary | ICD-10-CM | POA: Diagnosis present

## 2021-01-29 DIAGNOSIS — I808 Phlebitis and thrombophlebitis of other sites: Secondary | ICD-10-CM | POA: Diagnosis not present

## 2021-01-29 DIAGNOSIS — K2980 Duodenitis without bleeding: Secondary | ICD-10-CM | POA: Diagnosis not present

## 2021-01-29 DIAGNOSIS — R55 Syncope and collapse: Secondary | ICD-10-CM

## 2021-01-29 DIAGNOSIS — Z7982 Long term (current) use of aspirin: Secondary | ICD-10-CM

## 2021-01-29 DIAGNOSIS — M25472 Effusion, left ankle: Secondary | ICD-10-CM | POA: Diagnosis present

## 2021-01-29 DIAGNOSIS — E1165 Type 2 diabetes mellitus with hyperglycemia: Secondary | ICD-10-CM | POA: Diagnosis present

## 2021-01-29 DIAGNOSIS — Z79899 Other long term (current) drug therapy: Secondary | ICD-10-CM

## 2021-01-29 DIAGNOSIS — K644 Residual hemorrhoidal skin tags: Secondary | ICD-10-CM | POA: Diagnosis present

## 2021-01-29 DIAGNOSIS — E785 Hyperlipidemia, unspecified: Secondary | ICD-10-CM | POA: Diagnosis present

## 2021-01-29 DIAGNOSIS — Z8249 Family history of ischemic heart disease and other diseases of the circulatory system: Secondary | ICD-10-CM

## 2021-01-29 DIAGNOSIS — K5790 Diverticulosis of intestine, part unspecified, without perforation or abscess without bleeding: Secondary | ICD-10-CM | POA: Diagnosis not present

## 2021-01-29 DIAGNOSIS — R933 Abnormal findings on diagnostic imaging of other parts of digestive tract: Secondary | ICD-10-CM | POA: Diagnosis not present

## 2021-01-29 DIAGNOSIS — I10 Essential (primary) hypertension: Secondary | ICD-10-CM | POA: Diagnosis not present

## 2021-01-29 DIAGNOSIS — B9681 Helicobacter pylori [H. pylori] as the cause of diseases classified elsewhere: Secondary | ICD-10-CM | POA: Diagnosis present

## 2021-01-29 DIAGNOSIS — N529 Male erectile dysfunction, unspecified: Secondary | ICD-10-CM | POA: Diagnosis present

## 2021-01-29 DIAGNOSIS — K921 Melena: Secondary | ICD-10-CM | POA: Diagnosis not present

## 2021-01-29 DIAGNOSIS — K641 Second degree hemorrhoids: Secondary | ICD-10-CM | POA: Diagnosis present

## 2021-01-29 DIAGNOSIS — R609 Edema, unspecified: Secondary | ICD-10-CM

## 2021-01-29 LAB — CBC
HCT: 33.6 % — ABNORMAL LOW (ref 39.0–52.0)
Hemoglobin: 11.8 g/dL — ABNORMAL LOW (ref 13.0–17.0)
MCH: 33.3 pg (ref 26.0–34.0)
MCHC: 35.1 g/dL (ref 30.0–36.0)
MCV: 94.9 fL (ref 80.0–100.0)
Platelets: 219 10*3/uL (ref 150–400)
RBC: 3.54 MIL/uL — ABNORMAL LOW (ref 4.22–5.81)
RDW: 13.6 % (ref 11.5–15.5)
WBC: 7.9 10*3/uL (ref 4.0–10.5)
nRBC: 0 % (ref 0.0–0.2)

## 2021-01-29 LAB — HEMOGLOBIN AND HEMATOCRIT, BLOOD
HCT: 32.5 % — ABNORMAL LOW (ref 39.0–52.0)
Hemoglobin: 10.9 g/dL — ABNORMAL LOW (ref 13.0–17.0)

## 2021-01-29 LAB — PREPARE RBC (CROSSMATCH)

## 2021-01-29 LAB — COMPREHENSIVE METABOLIC PANEL
ALT: 28 U/L (ref 0–44)
AST: 22 U/L (ref 15–41)
Albumin: 3.5 g/dL (ref 3.5–5.0)
Alkaline Phosphatase: 42 U/L (ref 38–126)
Anion gap: 12 (ref 5–15)
BUN: 22 mg/dL (ref 8–23)
CO2: 19 mmol/L — ABNORMAL LOW (ref 22–32)
Calcium: 8.3 mg/dL — ABNORMAL LOW (ref 8.9–10.3)
Chloride: 104 mmol/L (ref 98–111)
Creatinine, Ser: 1.05 mg/dL (ref 0.61–1.24)
GFR, Estimated: >60 mL/min (ref >60)
Glucose, Bld: 149 mg/dL — ABNORMAL HIGH (ref 70–99)
Potassium: 4.2 mmol/L (ref 3.5–5.1)
Sodium: 135 mmol/L (ref 135–145)
Total Bilirubin: 0.8 mg/dL (ref 0.3–1.2)
Total Protein: 6.3 g/dL — ABNORMAL LOW (ref 6.5–8.1)

## 2021-01-29 LAB — POC OCCULT BLOOD, ED: Fecal Occult Bld: POSITIVE — AB

## 2021-01-29 LAB — ABO/RH: ABO/RH(D): O POS

## 2021-01-29 MED ORDER — ACETAMINOPHEN 325 MG PO TABS: 650.0000 mg | ORAL_TABLET | Freq: Four times a day (QID) | ORAL | Status: DC | PRN

## 2021-01-29 MED ORDER — ONDANSETRON HCL 4 MG PO TABS: 4.0000 mg | ORAL_TABLET | Freq: Four times a day (QID) | ORAL | Status: DC | PRN

## 2021-01-29 MED ORDER — ONDANSETRON HCL 4 MG/2ML IJ SOLN: 4.0000 mg | Freq: Four times a day (QID) | INTRAMUSCULAR | Status: DC | PRN

## 2021-01-29 MED ORDER — SODIUM CHLORIDE 0.9 % IV SOLN: INTRAVENOUS | Status: AC

## 2021-01-29 MED ORDER — SODIUM CHLORIDE 0.9 % IV BOLUS
1000.0000 mL | Freq: Once | INTRAVENOUS | Status: AC
  Administered 2021-01-29: 1000 mL via INTRAVENOUS

## 2021-01-29 MED ORDER — IOHEXOL 350 MG/ML SOLN
100.0000 mL | Freq: Once | INTRAVENOUS | Status: AC | PRN
  Administered 2021-01-29: 100 mL via INTRAVENOUS

## 2021-01-29 MED ORDER — SODIUM CHLORIDE 0.9% IV SOLUTION: Freq: Once | INTRAVENOUS | Status: DC

## 2021-01-29 MED ORDER — ACETAMINOPHEN 650 MG RE SUPP: 650.0000 mg | Freq: Four times a day (QID) | RECTAL | Status: DC | PRN

## 2021-01-29 NOTE — ED Triage Notes (Signed)
Patient arrived by [facility] following syncopal episode this am x 2 after passing greater than 400cc of bright red blood in stool. EMS administered 400cc of fluid prior to arrival. Patient alert but appears tired. Complains of weakness. Denies pain

## 2021-01-29 NOTE — ED Notes (Signed)
No pain or discomfort aand o x 4.  Wife at the bedside

## 2021-01-29 NOTE — ED Provider Notes (Signed)
[facility] EMERGENCY DEPARTMENT Provider Note   CSN: [id number] Arrival date & time: 01/29/21  1226     History Chief Complaint  Patient presents with  . GI Bleeding     Jay Robertson is a 64 y.o. male.  65 y.o male with a PMH of HTN presents to the ED via EMS for rectal bleeding.  Patient reports he was attempting to have a bowel movement, when he suddenly noted a lot of blood originating from his rectum, this was bright red.  According to EMS, he has about 400 cc of bright red blood and synopsized after this incident.  He reports feeling dizzy when this occurred, he received 400cc bolus by EMS. No prior episodes similar to these. He does endorses alcohol consumption but not daily. He has suffered from acid reflux for several years this is exacerbated by orange juice and coffee but improved with Tums. He has not been followed by GI. No prior endoscopy, or colonoscopy per patient.  Currently not on any blood thinners.  Denies any hematemesis, no hematuria, no abdominal pain.     The history is provided by the patient.  Rectal Bleeding Quality:  Bright red Amount:  Copious Duration:  2 hours Timing:  Constant Chronicity:  New Context: defecation and diarrhea   Context: not anal fissures, not anal penetration, not hemorrhoids, not rectal injury, not rectal pain and not spontaneously   Similar prior episodes: no   Relieved by:  Nothing Worsened by:  Defecation Ineffective treatments:  None tried Associated symptoms: dizziness   Associated symptoms: no abdominal pain, no epistaxis, no fever, no hematemesis, no light-headedness, no loss of consciousness, no recent illness and no vomiting   Risk factors: no anticoagulant use, no hx of colorectal cancer, no hx of colorectal surgery, no hx of IBD, no liver disease, no NSAID use and no steroid use        Past Medical History:  Diagnosis Date  . ALLERGIC RHINITIS 10/24/2007  . HYPERLIPIDEMIA 10/16/2010  .  HYPERTENSION 07/25/2007    Patient Active Problem List   Diagnosis Date Noted  . Pure hypercholesterolemia 10/16/2010  . Allergic rhinitis 10/24/2007  . Essential hypertension 07/25/2007    Past Surgical History:  Procedure Laterality Date  . APPENDECTOMY    . CLAVICLE SURGERY    . NASAL SEPTUM SURGERY     septal deviation, turbinate hypertrophy and obstruction       No family history on file.  Social History   Tobacco Use  . Smoking status: Never Smoker  . Smokeless tobacco: Never Used  Substance Use Topics  . Alcohol use: Yes  . Drug use: No    Home Medications Prior to Admission medications   Medication Sig Start Date End Date Taking? Authorizing Provider  aspirin EC 325 MG tablet Take 325 mg by mouth every other day.    [provider]  benazepril (LOTENSIN) 20 MG tablet TAKE 1 TABLET BY MOUTH EVERY DAY 12/09/20   Billie Ruddy, MD  Brimonidine Tartrate (LUMIFY) 0.025 % SOLN Place 1-2 drops into both eyes 3 (three) times daily as needed (to reduce redness/irritation).     [provider]  Dextran 70-Hypromellose (ARTIFICIAL TEARS PF OP) Place 2 drops into both eyes every morning.    [provider]  fexofenadine (ALLEGRA) 180 MG tablet Take 180 mg by mouth daily as needed for allergies or rhinitis.     [provider]  fluticasone (FLONASE) 50 MCG/ACT nasal spray Place  1 spray into both nostrils daily. 05/20/20   Billie Ruddy, MD  mineral oil liquid Take 5 mLs by mouth daily as needed for mild constipation or moderate constipation.     [provider]  sildenafil (REVATIO) 20 MG tablet 2 tablets daily as needed 09/02/16   Marletta Lor, MD  spironolactone (ALDACTONE) 25 MG tablet TAKE 1 TABLET BY MOUTH EVERY DAY 01/07/21   Billie Ruddy, MD  triamcinolone (KENALOG) 0.1 % APPLY TO AFFECTED AREA TWICE A DAY 12/17/20   Billie Ruddy, MD  VIAGRA 50 MG tablet TAKE 1 TABLET BY MOUTH AT BEDTIME AS NEEDED 08/10/16    Marletta Lor, MD    Allergies    Doxycycline  Review of Systems   Review of Systems  Constitutional: Negative for fever.  HENT: Negative for nosebleeds.   Respiratory: Negative for shortness of breath.   Cardiovascular: Negative for chest pain.  Gastrointestinal: Positive for blood in stool and hematochezia. Negative for abdominal pain, hematemesis, nausea and vomiting.  Neurological: Positive for dizziness and syncope. Negative for loss of consciousness and light-headedness.  All other systems reviewed and are negative.   Physical Exam Updated Vital Signs BP 108/71   Pulse 90   Temp 98.4 F (36.9 C)   Resp (!) 27   Ht 5\' 11"  (1.803 m)   Wt 79.8 kg   SpO2 100%   BMI 24.55 kg/m   Physical Exam Vitals and nursing note reviewed. Exam conducted with a chaperone present.  Constitutional:      Appearance: Normal appearance. He is diaphoretic. He is not ill-appearing.  HENT:     Head: Normocephalic and atraumatic.     Nose: Nose normal.  Eyes:     Pupils: Pupils are equal, round, and reactive to light.  Cardiovascular:     Rate and Rhythm: Normal rate.  Pulmonary:     Effort: Pulmonary effort is normal.     Breath sounds: No wheezing or rales.  Abdominal:     General: Abdomen is flat.     Palpations: Abdomen is soft.     Tenderness: There is no abdominal tenderness. There is no right CVA tenderness or left CVA tenderness.  Genitourinary:    Rectum: Guaiac result positive. No external hemorrhoid or internal hemorrhoid. Normal anal tone.     Comments: Hemoccult grossly positive.  No external or internal hemorrhoids on my exam.  Chaperoned by Corrie Dandy. Musculoskeletal:     Cervical back: Normal range of motion and neck supple.  Skin:    General: Skin is warm.  Neurological:     Mental Status: He is alert and oriented to person, place, and time.     ED Results / Procedures / Treatments   Labs (all labs ordered are listed, but only abnormal results are  displayed) Labs Reviewed  COMPREHENSIVE METABOLIC PANEL - Abnormal; Notable for the following components:      Result Value   CO2 19 (*)    Glucose, Bld 149 (*)    Calcium 8.3 (*)    Total Protein 6.3 (*)    All other components within normal limits  CBC - Abnormal; Notable for the following components:   RBC 3.54 (*)    Hemoglobin 11.8 (*)    HCT 33.6 (*)    All other components within normal limits  POC OCCULT BLOOD, ED - Abnormal; Notable for the following components:   Fecal Occult Bld POSITIVE (*)    All other components within normal  limits  FERRITIN  IRON AND TIBC  TYPE AND SCREEN  ABO/RH  PREPARE RBC (CROSSMATCH)    EKG EKG Interpretation  Date/Time:  Wednesday January 29 2021 12:34:12 EST Ventricular Rate:  86 PR Interval:  172 QRS Duration: 94 QT Interval:  370 QTC Calculation: 442 R Axis:   82 Text Interpretation: Sinus rhythm with Premature atrial complexes Nonspecific ST and T wave abnormality Abnormal ECG Confirmed by Lennice Sites [phone]) on 01/29/2021 1:12:58 PM   Radiology CT Angio Abd/Pel W and/or Wo Contrast  Result Date: 01/29/2021 CLINICAL DATA:  65 year old male with GI bleed. EXAM: CTA ABDOMEN AND PELVIS WITHOUT AND WITH CONTRAST TECHNIQUE: Multidetector CT imaging of the abdomen and pelvis was performed using the standard protocol during bolus administration of intravenous contrast. Multiplanar reconstructed images and MIPs were obtained and reviewed to evaluate the vascular anatomy. CONTRAST:  156mL OMNIPAQUE IOHEXOL 350 MG/ML SOLN COMPARISON:  None. FINDINGS: VASCULAR Aorta: Mild atherosclerotic calcification. No aneurysmal dilatation or dissection. No periaortic fluid collection. Celiac: The celiac axis and its major branches are patent. There is a replaced left hepatic artery from left gastric artery. SMA: Patent without evidence of aneurysm, dissection, vasculitis or significant stenosis. Renals: Both renal arteries are patent without evidence of  aneurysm, dissection, vasculitis, fibromuscular dysplasia or significant stenosis. IMA: Patent without evidence of aneurysm, dissection, vasculitis or significant stenosis. Inflow: Mild atherosclerotic calcification. No aneurysmal dilatation or dissection. The iliac arteries are patent. Proximal Outflow: Bilateral common femoral and visualized portions of the superficial and profunda femoral arteries are patent without evidence of aneurysm, dissection, vasculitis or significant stenosis. Veins: The IVC is unremarkable. The SMV, splenic vein, and main portal vein are patent. No portal venous gas. Review of the MIP images confirms the above findings. NON-VASCULAR Lower chest: The visualized lung bases are clear. No intra-abdominal free air or free fluid. Hepatobiliary: No focal liver abnormality is seen. No gallstones, gallbladder wall thickening, or biliary dilatation. Pancreas: Unremarkable. No pancreatic ductal dilatation or surrounding inflammatory changes. Spleen: Normal in size without focal abnormality. Adrenals/Urinary Tract: The adrenal glands unremarkable. High density content within the renal collecting system on precontrast study may represent residual contrast from prior administration. However, a 3 mm nonobstructing left renal inferior pole calculus may be present. There is no hydronephrosis on either side. There is symmetric enhancement of the kidneys. A 1 cm left renal interpolar cyst. The visualized ureters appear unremarkable. The urinary bladder is collapsed. Stomach/Bowel: There is loose stool throughout the colon compatible with diarrheal state. There is sigmoid diverticulosis and scattered colonic diverticula without active inflammatory changes. There is segmental thickening of the proximal transverse colon which may be related to underdistention. There is however apparent soft tissue nodularity at the distal portion of the thickened segment (coronal 38/19) and therefore a mass is not excluded.  Further evaluation with colonoscopy after resolution of acute symptoms recommended. There is overall diffuse increase in the attenuation of the colonic content post contrast which may be related to hyperemia suggestive of possible mild colitis. No obvious focal contrast extravasation identified. There is no bowel obstruction. Appendectomy. Lymphatic: No adenopathy. Reproductive: The prostate and seminal vesicles are grossly unremarkable. Other: None Musculoskeletal: Degenerative changes with disc desiccation at L5-S1. No acute osseous pathology. IMPRESSION: 1. No definite CT evidence of active GI bleed. 2. Diarrheal state with possible mild colitis. No bowel obstruction. 3. Colonic diverticulosis. 4. Segmental thickening of the proximal transverse colon may be related to underdistention. A colonic mass is not excluded. Further evaluation with colonoscopy  after resolution of acute symptoms recommended. Electronically Signed   By: Anner Crete M.D.   On: 01/29/2021 18:22    Procedures .Critical Care Performed by: Janeece Fitting, PA-C Authorized by: Janeece Fitting, PA-C   Critical care provider statement:    Critical care time (minutes):  13   Critical care start time:  01/29/2021 4:00 PM   Critical care end time:  01/29/2021 4:45 PM   Critical care time was exclusive of:  Separately billable procedures and treating other patients   Critical care was necessary to treat or prevent imminent or life-threatening deterioration of the following conditions:  Circulatory failure   Critical care was time spent personally by me on the following activities:  Blood draw for specimens, development of treatment plan with patient or surrogate, discussions with consultants, evaluation of patient's response to treatment, examination of patient, obtaining history from patient or surrogate, ordering and performing treatments and interventions, ordering and review of laboratory studies, ordering and review of radiographic  studies, pulse oximetry, re-evaluation of patient's condition and review of old charts     Medications Ordered in ED Medications  0.9 %  sodium chloride infusion (Manually program via Guardrails IV Fluids) (has no administration in time range)  sodium chloride 0.9 % bolus 1,000 mL (0 mLs Intravenous Stopped 01/29/21 1756)  iohexol (OMNIPAQUE) 350 MG/ML injection 100 mL (100 mLs Intravenous Contrast Given 01/29/21 1737)    ED Course  I have reviewed the triage vital signs and the nursing notes.  Pertinent labs & imaging results that were available during my care of the patient were reviewed by me and considered in my medical decision making (see chart for details).  Clinical Course as of 01/29/21 1902  Wed Jan 29, 2021  1359 Fecal Occult Blood, POC(!): POSITIVE [JS]  1400 Hemoglobin(!): 11.8 [JS]    Clinical Course User Index [JS] Janeece Fitting, PA-C   MDM Rules/Calculators/A&P    Patient with a PMH of HTN presents to the ED via EMS s/p syncopal episode. Reports having blood movement this morning and putting out about 400cc of bright red blood and syncopise. He reports feeling dizzy once seeing the amount of blood, he therefore syncopized.  Ports no prior episodes like these.  He arrived in the ED with blood pressure soft on the systolic and 98.  He states no prior episodes like these, currently not on any blood thinners, no prior history of GI bleed, no NSAID use or alcohol abuse.No prior endoscopy or colonoscopy.   During evaluation patient is slightly diaphoretic, blood pressure has improved now after receiving 400 cc bolus by EMS, blood pressure is now with a systolic in the [id number] 85, standing in the 100% on room air.  During evaluation lungs are clear to auscultation, abdomen is soft, nontender to palpation with bowel sounds present.  No swelling to bilateral legs.  No visible blood on the oropharynx, denies any hematuria.  Interpretation of his labs revealed a CBC with a hemoglobin  of 11.8, stable no prior baseline for comparison.  No leukocytosis.  CMP without any electrolyte abnormality.  Creatinine level is within normal limits.  LFTs are unremarkable.  Hemoccult collected by me with chaperone Lovena Le at the bedside, grossly positive on my exam.  No external or internal hemorrhoids noted.  No signs of injury or penetration.  2:06 PM Call placed to gastroenterology for further recommendations.   2:35 PM Spoke to Dr. Rush Landmark gastroenterology, who will evaluate patient while in the ED. Will need to  keep him on clear liquids. Iron studies have been added. Patient will need CT Angio if actively bleeding, or becomes hemodynamically unstable.   4:35 PM patient has been evaluated Lilli Few, GI, he denied wanting to pursue colonoscopy at this time.  He is in capacity, states that he has been eating a lot of flaxseed, hep C, turmeric, protein powders in the morning, side effects of these as he reports her diarrhea therefore he does not believe a colonoscopy is necessary at this point he states "if is not broken do not fix it ".  He is aware that he will be leaving the hospital [place], does report he will try to keep an eye on the bleeding, and follow-up with his primary care physician.   4:48 PM While attempting to ambulate to the bathroom had a second syncopal episode. He became diaphoretic and has active bleeding including clots, when I enter the room he was found on a small pool of blood. We discussed CT Angio Abd/pelvis. Re consultation to GI. Discussion with patient on staying in the hospital in order to further evaluate bleed. He is agreeable at this time.   Vitals remarkable for soft pressures, becoming hemodynamically unstable.  I have ordered a unit of blood for transfusion.  We will also provide patient with fluids to help with hypotension.  Ct Angio Abd/pelvis:  1. No definite CT evidence of active GI bleed.  2. Diarrheal state with possible mild  colitis. No bowel obstruction.  3. Colonic diverticulosis.  4. Segmental thickening of the proximal transverse colon may be  related to underdistention. A colonic mass is not excluded. Further  evaluation with colonoscopy after resolution of acute symptoms  recommended.        These results were discussed with patient at length.  He is agreeable of staying in the hospital, obtaining their evaluation via colonoscopy by gastroenterology.  A call to [facility] has been replaced at this time.  Will admit patient via hospitalist service.   Spoke to Dr. Posey Pronto hospitalist service, appreciate his help who will admit patient for further management. Patient remains hemodynamically stable.  Portions of this note were generated with [employer]. Dictation errors may occur despite best attempts at proofreading.  Final Clinical Impression(s) / ED Diagnoses Final diagnoses:  Rectal bleeding  Syncope and collapse    Rx / DC Orders ED Discharge Orders    None       Janeece Fitting, PA-C 01/29/21 1902    Lennice Sites, DO 01/30/21 1842

## 2021-01-29 NOTE — H&P (Signed)
History and Physical    Jay Robertson XTG:[id number] DOB: [birth year] DOA: 01/29/2021  PCP: Billie Ruddy, MD  Patient coming from: Home via EMS  I have personally briefly reviewed patient's old medical records in [place]  Chief Complaint: Syncope, bloody stool  HPI: Jay Robertson is a 65 y.o. male with medical history significant for hypertension, hyperlipidemia who presents to the ED for evaluation of syncope following a bloody bowel movement.  Patient states this morning he had a bowel movement which initially appeared to be dark black.  This was quickly followed by bright red blood first mixed in the stool then continued bleeding per rectum.  He became dizzy and passed out twice at home.  EMS were called and per ED documentation they noted about 400 cc of bright red blood.  He was brought to the ED for further evaluation.  Patient denies any similar episodes in the past.  He has not had a colonoscopy before.  He says he takes an aspirin 325 mg on almost daily basis but not for the last 3 days.  He reports a history of acid reflux for many years which he notes is worse when he drinks orange juice or coffee.  He denies any abdominal pain or difficulty swallowing foods or liquids.  He denies any use of NSAIDs.  He is on benazepril and spironolactone for blood pressure.  He uses Viagra as needed with last use yesterday.  He says he has recently started using flaxseed and hempseed with protein shakes to increase his protein intake.  He denies any tobacco use.  He reports occasional alcohol use.  He reports a history of hypertension and diabetes in multiple siblings.  ED Course:  Initial vitals showed BP 98/76, pulse 74, RR 15, temp 37.4 F, SPO2 100% on room air.  Labs show WBC 7.9, hemoglobin 11.8, platelets 219,000, sodium 135, potassium 4.2, bicarb 19, BUN 22, creatinine 1.05, serum glucose 149, LFTs within normal limits.  FOBT is positive.  GI were consulted and offered colonoscopy  for evaluation of painless hematochezia however patient refused.  CTA abdomen/pelvis did not show any definite evidence of active GI bleed.  Possible mild colitis changes seen without evidence of bowel obstruction.  Colonic diverticulosis noted.  Thickening of the proximal transverse colon noted which may be related to underdistention however colonic mass not excluded.  Per EDP patient attempted to leave [place] however when attempting to ambulate to the bathroom had another syncopal episode.  He was noted to be diaphoretic and found on a small pool of blood.  Patient was given 1 L of normal saline in order to receive 1 unit PRBC transfusion.  Patient agreeable to remain in hospital now.  The hospitalist service was consulted to admit for further evaluation and management.  Review of Systems: All systems reviewed and are negative except as documented in history of present illness above.   Past Medical History:  Diagnosis Date  . ALLERGIC RHINITIS 10/24/2007  . HYPERLIPIDEMIA 10/16/2010  . HYPERTENSION 07/25/2007    Past Surgical History:  Procedure Laterality Date  . APPENDECTOMY    . CLAVICLE SURGERY    . NASAL SEPTUM SURGERY     septal deviation, turbinate hypertrophy and obstruction    Social History:  reports that he has never smoked. He has never used smokeless tobacco. He reports current alcohol use. He reports that he does not use drugs.  Allergies  Allergen Reactions  . Doxycycline Other (See Comments)  Headaches    Family History  Problem Relation Age of Onset  . Hypertension Brother   . Diabetes Brother      Prior to Admission medications   Medication Sig Start Date End Date Taking? Authorizing Provider  aspirin EC 325 MG tablet Take 325 mg by mouth every other day.    [provider]  benazepril (LOTENSIN) 20 MG tablet TAKE 1 TABLET BY MOUTH EVERY DAY 12/09/20   Billie Ruddy, MD  Brimonidine Tartrate (LUMIFY) 0.025 % SOLN Place 1-2  drops into both eyes 3 (three) times daily as needed (to reduce redness/irritation).     [provider]  Dextran 70-Hypromellose (ARTIFICIAL TEARS PF OP) Place 2 drops into both eyes every morning.    [provider]  fexofenadine (ALLEGRA) 180 MG tablet Take 180 mg by mouth daily as needed for allergies or rhinitis.     [provider]  fluticasone (FLONASE) 50 MCG/ACT nasal spray Place 1 spray into both nostrils daily. 05/20/20   Billie Ruddy, MD  mineral oil liquid Take 5 mLs by mouth daily as needed for mild constipation or moderate constipation.     [provider]  sildenafil (REVATIO) 20 MG tablet 2 tablets daily as needed 09/02/16   Marletta Lor, MD  spironolactone (ALDACTONE) 25 MG tablet TAKE 1 TABLET BY MOUTH EVERY DAY 01/07/21   Billie Ruddy, MD  triamcinolone (KENALOG) 0.1 % APPLY TO AFFECTED AREA TWICE A DAY 12/17/20   Billie Ruddy, MD  VIAGRA 50 MG tablet TAKE 1 TABLET BY MOUTH AT BEDTIME AS NEEDED 08/10/16   Marletta Lor, MD    Physical Exam: Vitals:   01/29/21 1804 01/29/21 1815 01/29/21 1830 01/29/21 1932  BP:  115/79 108/71 118/89  Pulse: 95 90 90 80  Resp: 14 15 (!) 27 18  Temp: 98.4 F (36.9 C)     TempSrc:      SpO2:  100% 100% 100%  Weight:      Height:       Constitutional: Resting supine in bed, NAD, calm, comfortable Eyes: PERRL, lids and conjunctivae normal ENMT: Mucous membranes are moist. Posterior pharynx clear of any exudate or lesions.Normal dentition.  Neck: normal, supple, no masses. Respiratory: clear to auscultation bilaterally, no wheezing, no crackles. Normal respiratory effort. No accessory muscle use.  Cardiovascular: Regular rate and rhythm, no murmurs / rubs / gallops. No extremity edema. 2+ pedal pulses. Abdomen: no tenderness, no masses palpated. No hepatosplenomegaly. Bowel sounds positive.  Musculoskeletal: no clubbing / cyanosis. No joint deformity upper and lower extremities.  Good ROM, no contractures. Normal muscle tone.  Skin: no rashes, lesions, ulcers. No induration Neurologic: CN 2-12 grossly intact. Sensation intact. Strength 5/5 in all 4.  Psychiatric: Normal judgment and insight. Alert and oriented x 3. Normal mood.   Labs on Admission: I have personally reviewed following labs and imaging studies  CBC: Recent Labs  Lab 01/29/21 1239  WBC 7.9  HGB 11.8*  HCT 33.6*  MCV 94.9  PLT 503   Basic Metabolic Panel: Recent Labs  Lab 01/29/21 1239  NA 135  K 4.2  CL 104  CO2 19*  GLUCOSE 149*  BUN 22  CREATININE 1.05  CALCIUM 8.3*   GFR: Estimated Creatinine Clearance: 74.7 mL/min (by C-G formula based on SCr of 1.05 mg/dL). Liver Function Tests: Recent Labs  Lab 01/29/21 1239  AST 22  ALT 28  ALKPHOS 42  BILITOT 0.8  PROT 6.3*  ALBUMIN 3.5  No results for input(s): LIPASE, AMYLASE in the last 168 hours. No results for input(s): AMMONIA in the last 168 hours. Coagulation Profile: No results for input(s): INR, PROTIME in the last 168 hours. Cardiac Enzymes: No results for input(s): CKTOTAL, CKMB, CKMBINDEX, TROPONINI in the last 168 hours. BNP (last 3 results) No results for input(s): PROBNP in the last 8760 hours. HbA1C: No results for input(s): HGBA1C in the last 72 hours. CBG: No results for input(s): GLUCAP in the last 168 hours. Lipid Profile: No results for input(s): CHOL, HDL, LDLCALC, TRIG, CHOLHDL, LDLDIRECT in the last 72 hours. Thyroid Function Tests: No results for input(s): TSH, T4TOTAL, FREET4, T3FREE, THYROIDAB in the last 72 hours. Anemia Panel: No results for input(s): VITAMINB12, FOLATE, FERRITIN, TIBC, IRON, RETICCTPCT in the last 72 hours. Urine analysis:    Component Value Date/Time   COLORURINE YELLOW 02/12/2019 1642   APPEARANCEUR CLEAR 02/12/2019 1642   LABSPEC 1.020 02/12/2019 1642   PHURINE 7.0 02/12/2019 1642   GLUCOSEU NEGATIVE 02/12/2019 1642   HGBUR NEGATIVE 02/12/2019 1642   HGBUR negative  10/09/2010 1158   BILIRUBINUR NEGATIVE 02/12/2019 1642   BILIRUBINUR Neg 11/22/2012 1454   KETONESUR 5 (A) 02/12/2019 1642   PROTEINUR NEGATIVE 02/12/2019 1642   UROBILINOGEN 0.2 01/09/2015 2002   NITRITE NEGATIVE 02/12/2019 1642   LEUKOCYTESUR SMALL (A) 02/12/2019 1642    Radiological Exams on Admission: CT Angio Abd/Pel W and/or Wo Contrast  Result Date: 01/29/2021 CLINICAL DATA:  64 year old male with GI bleed. EXAM: CTA ABDOMEN AND PELVIS WITHOUT AND WITH CONTRAST TECHNIQUE: Multidetector CT imaging of the abdomen and pelvis was performed using the standard protocol during bolus administration of intravenous contrast. Multiplanar reconstructed images and MIPs were obtained and reviewed to evaluate the vascular anatomy. CONTRAST:  146mL OMNIPAQUE IOHEXOL 350 MG/ML SOLN COMPARISON:  None. FINDINGS: VASCULAR Aorta: Mild atherosclerotic calcification. No aneurysmal dilatation or dissection. No periaortic fluid collection. Celiac: The celiac axis and its major branches are patent. There is a replaced left hepatic artery from left gastric artery. SMA: Patent without evidence of aneurysm, dissection, vasculitis or significant stenosis. Renals: Both renal arteries are patent without evidence of aneurysm, dissection, vasculitis, fibromuscular dysplasia or significant stenosis. IMA: Patent without evidence of aneurysm, dissection, vasculitis or significant stenosis. Inflow: Mild atherosclerotic calcification. No aneurysmal dilatation or dissection. The iliac arteries are patent. Proximal Outflow: Bilateral common femoral and visualized portions of the superficial and profunda femoral arteries are patent without evidence of aneurysm, dissection, vasculitis or significant stenosis. Veins: The IVC is unremarkable. The SMV, splenic vein, and main portal vein are patent. No portal venous gas. Review of the MIP images confirms the above findings. NON-VASCULAR Lower chest: The visualized lung bases are clear. No  intra-abdominal free air or free fluid. Hepatobiliary: No focal liver abnormality is seen. No gallstones, gallbladder wall thickening, or biliary dilatation. Pancreas: Unremarkable. No pancreatic ductal dilatation or surrounding inflammatory changes. Spleen: Normal in size without focal abnormality. Adrenals/Urinary Tract: The adrenal glands unremarkable. High density content within the renal collecting system on precontrast study may represent residual contrast from prior administration. However, a 3 mm nonobstructing left renal inferior pole calculus may be present. There is no hydronephrosis on either side. There is symmetric enhancement of the kidneys. A 1 cm left renal interpolar cyst. The visualized ureters appear unremarkable. The urinary bladder is collapsed. Stomach/Bowel: There is loose stool throughout the colon compatible with diarrheal state. There is sigmoid diverticulosis and scattered colonic diverticula without active inflammatory changes. There is segmental thickening  of the proximal transverse colon which may be related to underdistention. There is however apparent soft tissue nodularity at the distal portion of the thickened segment (coronal 38/19) and therefore a mass is not excluded. Further evaluation with colonoscopy after resolution of acute symptoms recommended. There is overall diffuse increase in the attenuation of the colonic content post contrast which may be related to hyperemia suggestive of possible mild colitis. No obvious focal contrast extravasation identified. There is no bowel obstruction. Appendectomy. Lymphatic: No adenopathy. Reproductive: The prostate and seminal vesicles are grossly unremarkable. Other: None Musculoskeletal: Degenerative changes with disc desiccation at L5-S1. No acute osseous pathology. IMPRESSION: 1. No definite CT evidence of active GI bleed. 2. Diarrheal state with possible mild colitis. No bowel obstruction. 3. Colonic diverticulosis. 4. Segmental  thickening of the proximal transverse colon may be related to underdistention. A colonic mass is not excluded. Further evaluation with colonoscopy after resolution of acute symptoms recommended. Electronically Signed   By: Anner Crete M.D.   On: 01/29/2021 18:22    EKG: Personally reviewed. Sinus rhythm with PAC.  PAC new when compared to prior.  Assessment/Plan Principal Problem:   Acute lower GI bleeding Active Problems:   Essential hypertension   Syncope  Jay Robertson is a 65 y.o. male with medical history significant for hypertension, hyperlipidemia who is admitted with acute GI bleed.  Acute blood loss anemia due to lower GI bleeding: Suspect diverticular bleed however malignancy not excluded and warrants further work-up.  Initially seen by GI and at that time patient refused colonoscopy however now that he has had a recurrent syncopal episode in the ED seems more willing to proceed with colonoscopy for further evaluation.  Hemoglobin initially 11.8 on arrival however not likely reflective of degree of acute bleeding. -Transfusing 1 unit PRBC -Keep n.p.o. after midnight -Hold aspirin, avoid NSAIDs  Syncope: Orthostatic due to acute blood loss.  Transfusing 1 unit PRBC as above.  Start IV fluid hydration.  Hold home benazepril and spironolactone.  Hypertension: Holding home benazepril and spironolactone due to syncopal episodes.  DVT prophylaxis: SCDs Code Status: Full code, confirmed with patient Family Communication: Discussed with patient, he has discussed with family Disposition Plan: From home likely discharge to home pending further GI work-up Consults called: Gastroenterology Level of care: Med-Surg Admission status:  Status is: Observation  The patient remains OBS appropriate and will d/c before 2 midnights.  Dispo: The patient is from: Home              Anticipated d/c is to: Home              Anticipated d/c date is: 1 day              Patient currently is  not medically stable to d/c.   Difficult to place patient No  Zada Finders MD [facility]  If 7PM-7AM, please contact night-coverage [email or web address]  01/29/2021, 7:46 PM

## 2021-01-29 NOTE — Consult Note (Addendum)
[facility] Consult: 3:49 PM 01/29/2021  LOS: 0 days    Referring Provider: ED MD  Primary Care Physician:  Billie Ruddy, MD Primary Gastroenterologist:  None     Reason for Consultation: Painless hematochezia.   HPI: Jay Robertson is a 65 y.o. male.  PMH hypertension.  Hyperlipidemia.  Mild OSA.  Not obese.  Patient has declined suggestion to undergo screening colonoscopies in the past.  Even now with hematochezia he is declining/refusing colonoscopy.  Patient is kind of a health not and he read something on the Internet about hemp and flaxseed.  He mixed this with some sort of an herbal tea and drank this yesterday.  This morning he had a stool that was mixed with blood.  After that he had multiple subsequent stools of pure blood.  The hematochezia ceased around noon today and it is now 4 PM.  No abdominal pain, no nausea.  No prior GI bleeding.  Appetite good.  No dizziness.  No chest pain.  No shortness of breath.  Patient takes the low-dose aspirin most days of the week just because he thinks it is good for him it has not been prescribed.  He does not use additional aspirin products or NSAIDs.  Hb 11.8, was 14.1 twelve months ago. MCV 94.  Platelets, WBCs normal.  BUN normal.  No coags.  No family history of colorectal cancer or GI diseases.  Family generally suffers from hypertension and diabetes.      Past Medical History:  Diagnosis Date  . ALLERGIC RHINITIS 10/24/2007  . HYPERLIPIDEMIA 10/16/2010  . HYPERTENSION 07/25/2007    Past Surgical History:  Procedure Laterality Date  . APPENDECTOMY    . CLAVICLE SURGERY    . NASAL SEPTUM SURGERY     septal deviation, turbinate hypertrophy and obstruction    Prior to Admission medications   Medication Sig Start Date End Date Taking? Authorizing  Provider  aspirin EC 325 MG tablet Take 325 mg by mouth every other day.    [provider]  benazepril (LOTENSIN) 20 MG tablet TAKE 1 TABLET BY MOUTH EVERY DAY 12/09/20   Billie Ruddy, MD  Brimonidine Tartrate (LUMIFY) 0.025 % SOLN Place 1-2 drops into both eyes 3 (three) times daily as needed (to reduce redness/irritation).     [provider]  Dextran 70-Hypromellose (ARTIFICIAL TEARS PF OP) Place 2 drops into both eyes every morning.    [provider]  fexofenadine (ALLEGRA) 180 MG tablet Take 180 mg by mouth daily as needed for allergies or rhinitis.     [provider]  fluticasone (FLONASE) 50 MCG/ACT nasal spray Place 1 spray into both nostrils daily. 05/20/20   Billie Ruddy, MD  mineral oil liquid Take 5 mLs by mouth daily as needed for mild constipation or moderate constipation.     [provider]  sildenafil (REVATIO) 20 MG tablet 2 tablets daily as needed 09/02/16   Marletta Lor, MD  spironolactone (ALDACTONE) 25 MG tablet TAKE 1 TABLET BY MOUTH EVERY DAY 01/07/21  Billie Ruddy, MD  triamcinolone (KENALOG) 0.1 % APPLY TO AFFECTED AREA TWICE A DAY 12/17/20   Billie Ruddy, MD  VIAGRA 50 MG tablet TAKE 1 TABLET BY MOUTH AT BEDTIME AS NEEDED 08/10/16   Marletta Lor, MD    Scheduled Meds:  Infusions:  PRN Meds:    Allergies as of 01/29/2021 - Review Complete 01/29/2021  Allergen Reaction Noted  . Doxycycline Other (See Comments)     No family history on file.  Social History   Socioeconomic History  . Marital status: Single    Spouse name: Not on file  . Number of children: Not on file  . Years of education: Not on file  . Highest education level: Not on file  Occupational History  . Not on file  Tobacco Use  . Smoking status: Never Smoker  . Smokeless tobacco: Never Used  Substance and Sexual Activity  . Alcohol use: Yes  . Drug use: No  . Sexual activity: Not on file  Other Topics Concern   . Not on file  Social History Narrative  . Not on file   Social Determinants of Health   Financial Resource Strain: Not on file  Food Insecurity: Not on file  Transportation Needs: Not on file  Physical Activity: Not on file  Stress: Not on file  Social Connections: Not on file  Intimate Partner Violence: Not on file    REVIEW OF SYSTEMS: Constitutional: No weakness, no dizziness ENT: Rarely sees a few streaks of blood when he blows his nose but no epistaxis. Pulm: No shortness of breath or cough CV:  No palpitations, no LE edema.  No angina GU:  No hematuria, no frequency GI: See HPI Heme: No unusually excessive bleeding or bruising. Transfusions: None. Neuro:  No headaches, no peripheral tingling or numbness.  No syncope, no seizures Derm:  No itching, no rash or sores.  Endocrine:  No sweats or chills.  No polyuria or dysuria Immunization: Reviewed.  He had [place] vaccination with booster. Travel:  None beyond local counties in last few months.    PHYSICAL EXAM: Vital signs in last 24 hours: Vitals:   01/29/21 1400 01/29/21 1500  BP: 138/86 115/84  Pulse: 84 86  Resp: 16 17  Temp:    SpO2: 100% 100%   Wt Readings from Last 3 Encounters:  01/29/21 79.8 kg  10/16/20 82.9 kg  07/17/20 86.2 kg    General: Well-appearing.  Comfortable, alert.  Does not look ill. Head: No facial asymmetry or swelling.  No signs of head trauma. Eyes: No scleral icterus.  No conjunctival pallor.  EOMI Ears: Not hard of hearing Nose: No congestion, no discharge Mouth: Moist, pink, clear mucosa.  Tongue midline. Neck: No JVD, masses, thyromegaly Lungs: Clear bilaterally.  No labored breathing or cough. Heart: RRR.  No MRG.  S1, S2 present Abdomen: Soft.  Active bowel sounds.  Not distended or tender.  No organomegaly, bruits, hernias.   Rectal: Red blood in the perineal region.  Red blood on exam finger.  No masses. Musc/Skeltl: No joint redness, swelling or gross  deformity. Extremities: No CCE. Neurologic: Oriented x3.  Alert.  Moves all 4 limbs, full strength. Skin: No rash, no sores, no telangiectasia. Tattoos: None observed Nodes: No cervical adenopathy Psych: Cooperative.  Fluid speech.  Loquacious.  Intake/Output from previous day: No intake/output data recorded. Intake/Output this shift: No intake/output data recorded.  LAB RESULTS: Recent Labs    01/29/21 1239  WBC 7.9  HGB 11.8*  HCT 33.6*  PLT 219   BMET Lab Results  Component Value Date   NA 135 01/29/2021   NA 137 02/12/2019   NA 138 01/09/2015   K 4.2 01/29/2021   K 3.6 02/12/2019   K 5.1 01/09/2015   CL 104 01/29/2021   CL 103 02/12/2019   CL 101 01/09/2015   CO2 19 (L) 01/29/2021   CO2 22 02/12/2019   CO2 30 01/09/2015   GLUCOSE 149 (H) 01/29/2021   GLUCOSE 109 (H) 02/12/2019   GLUCOSE 105 (H) 01/09/2015   BUN 22 01/29/2021   BUN 10 02/12/2019   BUN 17 01/09/2015   CREATININE 1.05 01/29/2021   CREATININE 1.05 02/12/2019   CREATININE 0.98 01/09/2015   CALCIUM 8.3 (L) 01/29/2021   CALCIUM 9.4 02/12/2019   CALCIUM 9.4 01/09/2015   LFT Recent Labs    01/29/21 1239  PROT 6.3*  ALBUMIN 3.5  AST 22  ALT 28  ALKPHOS 42  BILITOT 0.8   PT/INR No results found for: INR, PROTIME Hepatitis Panel No results for input(s): HEPBSAG, HCVAB, HEPAIGM, HEPBIGM in the last 72 hours. C-Diff No components found for: CDIFF Lipase  No results found for: LIPASE  Drugs of Abuse  No results found for: LABOPIA, COCAINSCRNUR, LABBENZ, AMPHETMU, THCU, LABBARB   RADIOLOGY STUDIES: No results found.    IMPRESSION:   *    Painless hematochezia.  This occurred after he took a concoction of hemp and flaxseed. ?  Diverticular bleed (likely diagnosis).  R/o malignancy. Patient unwilling to undergo colonoscopy.  *    Minor anemia, normocytic.    PLAN:     *   Offered the patient the colonoscopy but he is declining/refusing.  *    Could obtain a CTAP with  contrast to rule out large intestinal malignancy and assess for diverticulosis.  However a CT scan is no substitute for a colonoscopy.  Patient understands that a colon cancer is a possible diagnosis and he is willing to accept the risk of not having a diagnostic colonoscopy.  *   outpt cologuard through his PCP.     Azucena Freed  01/29/2021, 3:49 PM Phone [phone]

## 2021-01-29 NOTE — ED Notes (Signed)
Blood transfusion just infused  Will be awhile until the pts blood can be drawn after the trasnfusion

## 2021-01-29 NOTE — ED Notes (Signed)
Unit of blood transfused no adverse reaction.  Alert oriented skin warm and dry

## 2021-01-29 NOTE — ED Notes (Signed)
Pt asking for a bed pan  He wanted to get up and go to the br   No syncope from earlier

## 2021-01-29 NOTE — ED Notes (Signed)
No pain anywhere from the fall

## 2021-01-29 NOTE — ED Notes (Signed)
Bloodtransfusion delayed  When I went to blood bank to get blood for this pt  He was taken to c-t.. blood started whenever he returnwed to the room  I went ad got him

## 2021-01-29 NOTE — ED Notes (Signed)
We qlready has a talk earlier that he would not go to the br alone  His wife reported that she would go with him.Marland Kitchen apparently he waited for her to leave the room got aND CAlled her to help but before she returned to the room the pt decided to go on to the br.  Both side rails werwe up  He had crawled down to the foot of the bed an passed out with blood everywhere.  Perspiring profusely back to bed nss wide open [place] momentarily  strarted talking evenbefore he was moved back into bed

## 2021-01-30 DIAGNOSIS — K2980 Duodenitis without bleeding: Secondary | ICD-10-CM | POA: Diagnosis not present

## 2021-01-30 DIAGNOSIS — K641 Second degree hemorrhoids: Secondary | ICD-10-CM | POA: Diagnosis present

## 2021-01-30 DIAGNOSIS — K635 Polyp of colon: Secondary | ICD-10-CM | POA: Diagnosis not present

## 2021-01-30 DIAGNOSIS — T82868A Thrombosis of vascular prosthetic devices, implants and grafts, initial encounter: Secondary | ICD-10-CM | POA: Diagnosis not present

## 2021-01-30 DIAGNOSIS — I10 Essential (primary) hypertension: Secondary | ICD-10-CM | POA: Diagnosis not present

## 2021-01-30 DIAGNOSIS — K922 Gastrointestinal hemorrhage, unspecified: Secondary | ICD-10-CM | POA: Diagnosis present

## 2021-01-30 DIAGNOSIS — K2970 Gastritis, unspecified, without bleeding: Secondary | ICD-10-CM | POA: Diagnosis not present

## 2021-01-30 DIAGNOSIS — K449 Diaphragmatic hernia without obstruction or gangrene: Secondary | ICD-10-CM | POA: Diagnosis not present

## 2021-01-30 DIAGNOSIS — R55 Syncope and collapse: Secondary | ICD-10-CM | POA: Diagnosis not present

## 2021-01-30 DIAGNOSIS — I808 Phlebitis and thrombophlebitis of other sites: Secondary | ICD-10-CM | POA: Diagnosis not present

## 2021-01-30 DIAGNOSIS — E785 Hyperlipidemia, unspecified: Secondary | ICD-10-CM | POA: Diagnosis present

## 2021-01-30 DIAGNOSIS — K921 Melena: Secondary | ICD-10-CM | POA: Diagnosis not present

## 2021-01-30 DIAGNOSIS — E1165 Type 2 diabetes mellitus with hyperglycemia: Secondary | ICD-10-CM | POA: Diagnosis present

## 2021-01-30 DIAGNOSIS — K219 Gastro-esophageal reflux disease without esophagitis: Secondary | ICD-10-CM | POA: Diagnosis present

## 2021-01-30 DIAGNOSIS — K644 Residual hemorrhoidal skin tags: Secondary | ICD-10-CM | POA: Diagnosis present

## 2021-01-30 DIAGNOSIS — K625 Hemorrhage of anus and rectum: Secondary | ICD-10-CM | POA: Diagnosis not present

## 2021-01-30 DIAGNOSIS — N529 Male erectile dysfunction, unspecified: Secondary | ICD-10-CM | POA: Diagnosis present

## 2021-01-30 DIAGNOSIS — Z452 Encounter for adjustment and management of vascular access device: Secondary | ICD-10-CM | POA: Diagnosis not present

## 2021-01-30 DIAGNOSIS — T82898A Other specified complication of vascular prosthetic devices, implants and grafts, initial encounter: Secondary | ICD-10-CM | POA: Diagnosis not present

## 2021-01-30 DIAGNOSIS — D62 Acute posthemorrhagic anemia: Secondary | ICD-10-CM | POA: Diagnosis not present

## 2021-01-30 DIAGNOSIS — K259 Gastric ulcer, unspecified as acute or chronic, without hemorrhage or perforation: Secondary | ICD-10-CM

## 2021-01-30 DIAGNOSIS — K5731 Diverticulosis of large intestine without perforation or abscess with bleeding: Secondary | ICD-10-CM | POA: Diagnosis not present

## 2021-01-30 DIAGNOSIS — R578 Other shock: Secondary | ICD-10-CM | POA: Diagnosis not present

## 2021-01-30 DIAGNOSIS — I809 Phlebitis and thrombophlebitis of unspecified site: Secondary | ICD-10-CM | POA: Diagnosis not present

## 2021-01-30 DIAGNOSIS — K3189 Other diseases of stomach and duodenum: Secondary | ICD-10-CM | POA: Diagnosis not present

## 2021-01-30 DIAGNOSIS — K2950 Unspecified chronic gastritis without bleeding: Secondary | ICD-10-CM | POA: Diagnosis not present

## 2021-01-30 DIAGNOSIS — M19072 Primary osteoarthritis, left ankle and foot: Secondary | ICD-10-CM | POA: Diagnosis not present

## 2021-01-30 DIAGNOSIS — R933 Abnormal findings on diagnostic imaging of other parts of digestive tract: Secondary | ICD-10-CM | POA: Diagnosis not present

## 2021-01-30 DIAGNOSIS — K5790 Diverticulosis of intestine, part unspecified, without perforation or abscess without bleeding: Secondary | ICD-10-CM | POA: Diagnosis not present

## 2021-01-30 DIAGNOSIS — I82612 Acute embolism and thrombosis of superficial veins of left upper extremity: Secondary | ICD-10-CM | POA: Diagnosis not present

## 2021-01-30 DIAGNOSIS — W19XXXA Unspecified fall, initial encounter: Secondary | ICD-10-CM | POA: Diagnosis present

## 2021-01-30 DIAGNOSIS — J309 Allergic rhinitis, unspecified: Secondary | ICD-10-CM | POA: Diagnosis present

## 2021-01-30 DIAGNOSIS — B9681 Helicobacter pylori [H. pylori] as the cause of diseases classified elsewhere: Secondary | ICD-10-CM | POA: Diagnosis not present

## 2021-01-30 DIAGNOSIS — G4733 Obstructive sleep apnea (adult) (pediatric): Secondary | ICD-10-CM | POA: Diagnosis present

## 2021-01-30 DIAGNOSIS — Z20822 Contact with and (suspected) exposure to covid-19: Secondary | ICD-10-CM | POA: Diagnosis not present

## 2021-01-30 DIAGNOSIS — K5730 Diverticulosis of large intestine without perforation or abscess without bleeding: Secondary | ICD-10-CM | POA: Diagnosis not present

## 2021-01-30 DIAGNOSIS — R011 Cardiac murmur, unspecified: Secondary | ICD-10-CM | POA: Diagnosis not present

## 2021-01-30 DIAGNOSIS — M25472 Effusion, left ankle: Secondary | ICD-10-CM | POA: Diagnosis present

## 2021-01-30 LAB — CBC
HCT: 23.9 % — ABNORMAL LOW (ref 39.0–52.0)
Hemoglobin: 8.6 g/dL — ABNORMAL LOW (ref 13.0–17.0)
MCH: 32.5 pg (ref 26.0–34.0)
MCHC: 36 g/dL (ref 30.0–36.0)
MCV: 90.2 fL (ref 80.0–100.0)
Platelets: 179 10*3/uL (ref 150–400)
RBC: 2.65 MIL/uL — ABNORMAL LOW (ref 4.22–5.81)
RDW: 16.4 % — ABNORMAL HIGH (ref 11.5–15.5)
WBC: 8.2 10*3/uL (ref 4.0–10.5)
nRBC: 0.2 % (ref 0.0–0.2)

## 2021-01-30 LAB — IRON AND TIBC
Iron: 72 ug/dL (ref 45–182)
Saturation Ratios: 27 % (ref 17.9–39.5)
TIBC: 267 ug/dL (ref 250–450)
UIBC: 195 ug/dL

## 2021-01-30 LAB — BASIC METABOLIC PANEL
Anion gap: 8 (ref 5–15)
BUN: 17 mg/dL (ref 8–23)
CO2: 20 mmol/L — ABNORMAL LOW (ref 22–32)
Calcium: 7.8 mg/dL — ABNORMAL LOW (ref 8.9–10.3)
Chloride: 109 mmol/L (ref 98–111)
Creatinine, Ser: 0.97 mg/dL (ref 0.61–1.24)
GFR, Estimated: >60 mL/min (ref >60)
Glucose, Bld: 125 mg/dL — ABNORMAL HIGH (ref 70–99)
Potassium: 3.9 mmol/L (ref 3.5–5.1)
Sodium: 137 mmol/L (ref 135–145)

## 2021-01-30 LAB — FERRITIN: Ferritin: 98 ng/mL (ref 24–336)

## 2021-01-30 LAB — HIV ANTIBODY (ROUTINE TESTING W REFLEX): HIV Screen 4th Generation wRfx: NONREACTIVE

## 2021-01-30 LAB — SARS CORONAVIRUS 2 (TAT 6-24 HRS): SARS Coronavirus 2: NEGATIVE

## 2021-01-30 MED ORDER — PEG-KCL-NACL-NASULF-NA ASC-C 100 G PO SOLR: 1.0000 | Freq: Once | ORAL | Status: DC

## 2021-01-30 MED ORDER — PEG-KCL-NACL-NASULF-NA ASC-C 100 G PO SOLR
0.5000 | Freq: Once | ORAL | Status: AC
  Administered 2021-01-31: 100 g via ORAL
  Filled 2021-01-30 (×2): qty 1

## 2021-01-30 MED ORDER — PEG-KCL-NACL-NASULF-NA ASC-C 100 G PO SOLR
0.5000 | Freq: Once | ORAL | Status: AC
  Administered 2021-02-01: 100 g via ORAL
  Filled 2021-01-30: qty 1

## 2021-01-30 MED ORDER — PEG-KCL-NACL-NASULF-NA ASC-C 100 G PO SOLR
0.5000 | Freq: Once | ORAL | Status: DC
  Filled 2021-01-30: qty 1

## 2021-01-30 MED ORDER — BISACODYL 5 MG PO TBEC
20.0000 mg | DELAYED_RELEASE_TABLET | Freq: Once | ORAL | Status: AC
  Administered 2021-01-31: 20 mg via ORAL
  Filled 2021-01-30: qty 4

## 2021-01-30 NOTE — ED Notes (Signed)
Pt was provided with a bedside commode. Pt is a&ox4 at this time and express no complaints.

## 2021-01-30 NOTE — ED Notes (Signed)
Called floor and they stated there is no bed in the room and RN unaware pt is getting a patient

## 2021-01-30 NOTE — Progress Notes (Signed)
PROGRESS NOTE  Jay Robertson [id number] DOB: [birth year] DOA: 01/29/2021 PCP: Billie Ruddy, MD   LOS: 0 days   Brief Narrative / Interim history: 65 year old male with history of hypertension, hyperlipidemia who came into the hospital for evaluation for syncope after having a bloody bowel movement. On the morning of admission had a bowel movement which initially appeared to be dark black, then this was followed by bright red blood mixed in stool. Continue to have bleeding per rectum, became dizzy and passed out twice. He came to the hospital, initially did not want to be worked up and wanted to go home, however had recurrent bleeding in the ED and decided to stay.  Subjective / 24h Interval events: Lots of complaints this morning, has not been having the help that he was hoping for overnight from the staff. Continues to have bright red blood and gets dizzy every time he stands up  Assessment & Plan: Principal Problem Acute blood loss anemia, due to lower GI bleeding - gastroenterology consulted, following, discussed with Dr. Rush Landmark. CT angiogram without any significant source of bleeding. Continue to check hemoglobin every 6 hours.  Active Problems Syncope -orthostatic, due to blood loss. Received a unit of packed red blood cells, continue to monitor CBC  Essential hypertension -hold home medications due to syncopal episodes  Scheduled Meds: . sodium chloride   Intravenous Once   Continuous Infusions: PRN Meds:.acetaminophen **OR** acetaminophen, ondansetron **OR** ondansetron (ZOFRAN) IV  Diet Orders (From admission, onward)    Start     Ordered   01/30/21 0001  Diet NPO time specified  Diet effective midnight        01/29/21 1952          DVT prophylaxis: SCDs Start: 01/29/21 1951     Code Status: Full Code  Family Communication: no family at bedside   Status is: Observation  The patient will require care spanning > 2 midnights and should be moved to inpatient  because: Inpatient level of care appropriate due to severity of illness  Dispo: The patient is from: Home              Anticipated d/c is to: Home              Anticipated d/c date is: 2 days              Patient currently is not medically stable to d/c.   Difficult to place patient No  Level of care: Med-Surg  Consultants:  GI  Procedures:  none  Microbiology  none  Antimicrobials: none    Objective: Vitals:   01/30/21 0715 01/30/21 0730 01/30/21 0745 01/30/21 0754  BP: 138/88 (!) 129/91 (!) 129/94 132/86  Pulse: 96 94 85 90  Resp:    16  Temp:      TempSrc:      SpO2: 100% 100% 99% 100%  Weight:      Height:        Intake/Output Summary (Last 24 hours) at 01/30/2021 [id number] Last data filed at 01/29/2021 2001 Gross per 24 hour  Intake --  Output 200 ml  Net -200 ml   Filed Weights   01/29/21 1229  Weight: 79.8 kg    Examination:  Constitutional: NAD Eyes: no scleral icterus ENMT: Mucous membranes are moist.  Neck: normal, supple Respiratory: clear to auscultation bilaterally, no wheezing, no crackles. Cardiovascular: Regular rate and rhythm, no murmurs / rubs / gallops.  Abdomen: non distended, no tenderness. Bowel sounds positive.  Musculoskeletal: no clubbing / cyanosis.  Skin: no rashes Neurologic: CN 2-12 grossly intact. Strength 5/5 in all 4.   Data Reviewed: I have independently reviewed following labs and imaging studies   CBC: Recent Labs  Lab 01/29/21 1239 01/29/21 2225  WBC 7.9  --   HGB 11.8* 10.9*  HCT 33.6* 32.5*  MCV 94.9  --   PLT 219  --    Basic Metabolic Panel: Recent Labs  Lab 01/29/21 1239  NA 135  K 4.2  CL 104  CO2 19*  GLUCOSE 149*  BUN 22  CREATININE 1.05  CALCIUM 8.3*   Liver Function Tests: Recent Labs  Lab 01/29/21 1239  AST 22  ALT 28  ALKPHOS 42  BILITOT 0.8  PROT 6.3*  ALBUMIN 3.5   Coagulation Profile: No results for input(s): INR, PROTIME in the last 168 hours. HbA1C: No results for  input(s): HGBA1C in the last 72 hours. CBG: No results for input(s): GLUCAP in the last 168 hours.  Recent Results (from the past 240 hour(s))  SARS CORONAVIRUS 2 (TAT 6-24 HRS) Nasopharyngeal Nasopharyngeal Swab     Status: None   Collection Time: 01/29/21  8:00 PM   Specimen: Nasopharyngeal Swab  Result Value Ref Range Status   SARS Coronavirus 2 NEGATIVE NEGATIVE Final    Comment: (NOTE) SARS-CoV-2 target nucleic acids are NOT DETECTED.  The SARS-CoV-2 RNA is generally detectable in upper and lower respiratory specimens during the acute phase of infection. Negative results do not preclude SARS-CoV-2 infection, do not rule out co-infections with other pathogens, and should not be used as the sole basis for treatment or other patient management decisions. Negative results must be combined with clinical observations, patient history, and epidemiological information. The expected result is Negative.  Fact Sheet for Patients: SugarRoll.be  Fact Sheet for Healthcare Providers: https://www.woods-mathews.com/  This test is not yet approved or cleared by the [place] FDA and  has been authorized for detection and/or diagnosis of SARS-CoV-2 by FDA under an Emergency Use Authorization (EUA). This EUA will remain  in effect (meaning this test can be used) for the duration of the COVID-19 declaration under Se ction 564(b)(1) of the Act, 21 U.S.C. section 360bbb-3(b)(1), unless the authorization is terminated or revoked sooner.  Performed at [facility], [place] [address], [place], [place] [address]      Radiology Studies: CT Angio Abd/Pel W and/or Wo Contrast  Result Date: 01/29/2021 CLINICAL DATA:  65 year old male with GI bleed. EXAM: CTA ABDOMEN AND PELVIS WITHOUT AND WITH CONTRAST TECHNIQUE: Multidetector CT imaging of the abdomen and pelvis was performed using the standard protocol during bolus administration of intravenous  contrast. Multiplanar reconstructed images and MIPs were obtained and reviewed to evaluate the vascular anatomy. CONTRAST:  160mL OMNIPAQUE IOHEXOL 350 MG/ML SOLN COMPARISON:  None. FINDINGS: VASCULAR Aorta: Mild atherosclerotic calcification. No aneurysmal dilatation or dissection. No periaortic fluid collection. Celiac: The celiac axis and its major branches are patent. There is a replaced left hepatic artery from left gastric artery. SMA: Patent without evidence of aneurysm, dissection, vasculitis or significant stenosis. Renals: Both renal arteries are patent without evidence of aneurysm, dissection, vasculitis, fibromuscular dysplasia or significant stenosis. IMA: Patent without evidence of aneurysm, dissection, vasculitis or significant stenosis. Inflow: Mild atherosclerotic calcification. No aneurysmal dilatation or dissection. The iliac arteries are patent. Proximal Outflow: Bilateral common femoral and visualized portions of the superficial and profunda femoral arteries are patent without evidence of aneurysm, dissection, vasculitis or significant stenosis. Veins: The  IVC is unremarkable. The SMV, splenic vein, and main portal vein are patent. No portal venous gas. Review of the MIP images confirms the above findings. NON-VASCULAR Lower chest: The visualized lung bases are clear. No intra-abdominal free air or free fluid. Hepatobiliary: No focal liver abnormality is seen. No gallstones, gallbladder wall thickening, or biliary dilatation. Pancreas: Unremarkable. No pancreatic ductal dilatation or surrounding inflammatory changes. Spleen: Normal in size without focal abnormality. Adrenals/Urinary Tract: The adrenal glands unremarkable. High density content within the renal collecting system on precontrast study may represent residual contrast from prior administration. However, a 3 mm nonobstructing left renal inferior pole calculus may be present. There is no hydronephrosis on either side. There is symmetric  enhancement of the kidneys. A 1 cm left renal interpolar cyst. The visualized ureters appear unremarkable. The urinary bladder is collapsed. Stomach/Bowel: There is loose stool throughout the colon compatible with diarrheal state. There is sigmoid diverticulosis and scattered colonic diverticula without active inflammatory changes. There is segmental thickening of the proximal transverse colon which may be related to underdistention. There is however apparent soft tissue nodularity at the distal portion of the thickened segment (coronal 38/19) and therefore a mass is not excluded. Further evaluation with colonoscopy after resolution of acute symptoms recommended. There is overall diffuse increase in the attenuation of the colonic content post contrast which may be related to hyperemia suggestive of possible mild colitis. No obvious focal contrast extravasation identified. There is no bowel obstruction. Appendectomy. Lymphatic: No adenopathy. Reproductive: The prostate and seminal vesicles are grossly unremarkable. Other: None Musculoskeletal: Degenerative changes with disc desiccation at L5-S1. No acute osseous pathology. IMPRESSION: 1. No definite CT evidence of active GI bleed. 2. Diarrheal state with possible mild colitis. No bowel obstruction. 3. Colonic diverticulosis. 4. Segmental thickening of the proximal transverse colon may be related to underdistention. A colonic mass is not excluded. Further evaluation with colonoscopy after resolution of acute symptoms recommended. Electronically Signed   By: Anner Crete M.D.   On: 01/29/2021 18:22    Marzetta Board, MD, PhD [facility]  Between 7 am - 7 pm I am available, please contact me via Amion or Securechat  Between 7 pm - 7 am I am not available, please contact night coverage MD/APP via Amion   \

## 2021-01-30 NOTE — ED Notes (Signed)
Pt on right side. Appears to be sleeping. Chest rise and fall equal.

## 2021-01-30 NOTE — Progress Notes (Signed)
Daily Rounding Note  01/30/2021, 2:23 PM  LOS: 0 days   SUBJECTIVE:   Chief complaint:   Painless hematochezia. Further episodes of hematochezia overnight and this morning. Orthostatic dizziness with standing.  OBJECTIVE:         Vital signs in last 24 hours:    Temp:  [98 F (36.7 C)-99 F (37.2 C)] 98 F (36.7 C) (02/03 1330) Pulse Rate:  [69-111] 99 (02/03 1330) Resp:  [9-31] 18 (02/03 1330) BP: (95-148)/(66-124) 148/95 (02/03 1330) SpO2:  [97 %-100 %] 100 % (02/03 1330)   Filed Weights   01/29/21 1229  Weight: 79.8 kg   General: Looks well Heart: RRR Chest: Clear bilaterally Abdomen: Soft, nontender, nondistended. Extremities: No CCE Neuro/Psych: Oriented x3.  Alert.  Moves all 4 limbs.  No gross deficits.  No tremors  Intake/Output from previous day: 02/02 0701 - 02/03 0700 In: -  Out: 200 [Urine:200]  Intake/Output this shift: No intake/output data recorded.  Lab Results: Recent Labs    01/29/21 1239 01/29/21 2225  WBC 7.9  --   HGB 11.8* 10.9*  HCT 33.6* 32.5*  PLT 219  --    BMET Recent Labs    01/29/21 1239  NA 135  K 4.2  CL 104  CO2 19*  GLUCOSE 149*  BUN 22  CREATININE 1.05  CALCIUM 8.3*   LFT Recent Labs    01/29/21 1239  PROT 6.3*  ALBUMIN 3.5  AST 22  ALT 28  ALKPHOS 42  BILITOT 0.8   PT/INR No results for input(s): LABPROT, INR in the last 72 hours. Hepatitis Panel No results for input(s): HEPBSAG, HCVAB, HEPAIGM, HEPBIGM in the last 72 hours.  Studies/Results: CT Angio Abd/Pel W and/or Wo Contrast  Result Date: 01/29/2021 CLINICAL DATA:  65 year old male with GI bleed. EXAM: CTA ABDOMEN AND PELVIS WITHOUT AND WITH CONTRAST TECHNIQUE: Multidetector CT imaging of the abdomen and pelvis was performed using the standard protocol during bolus administration of intravenous contrast. Multiplanar reconstructed images and MIPs were obtained and reviewed to evaluate the  vascular anatomy. CONTRAST:  137mL OMNIPAQUE IOHEXOL 350 MG/ML SOLN COMPARISON:  None. FINDINGS: VASCULAR Aorta: Mild atherosclerotic calcification. No aneurysmal dilatation or dissection. No periaortic fluid collection. Celiac: The celiac axis and its major branches are patent. There is a replaced left hepatic artery from left gastric artery. SMA: Patent without evidence of aneurysm, dissection, vasculitis or significant stenosis. Renals: Both renal arteries are patent without evidence of aneurysm, dissection, vasculitis, fibromuscular dysplasia or significant stenosis. IMA: Patent without evidence of aneurysm, dissection, vasculitis or significant stenosis. Inflow: Mild atherosclerotic calcification. No aneurysmal dilatation or dissection. The iliac arteries are patent. Proximal Outflow: Bilateral common femoral and visualized portions of the superficial and profunda femoral arteries are patent without evidence of aneurysm, dissection, vasculitis or significant stenosis. Veins: The IVC is unremarkable. The SMV, splenic vein, and main portal vein are patent. No portal venous gas. Review of the MIP images confirms the above findings. NON-VASCULAR Lower chest: The visualized lung bases are clear. No intra-abdominal free air or free fluid. Hepatobiliary: No focal liver abnormality is seen. No gallstones, gallbladder wall thickening, or biliary dilatation. Pancreas: Unremarkable. No pancreatic ductal dilatation or surrounding inflammatory changes. Spleen: Normal in size without focal abnormality. Adrenals/Urinary Tract: The adrenal glands unremarkable. High density content within the renal collecting system on precontrast study may represent residual contrast from prior administration. However, a 3 mm nonobstructing left renal inferior pole calculus may be  present. There is no hydronephrosis on either side. There is symmetric enhancement of the kidneys. A 1 cm left renal interpolar cyst. The visualized ureters appear  unremarkable. The urinary bladder is collapsed. Stomach/Bowel: There is loose stool throughout the colon compatible with diarrheal state. There is sigmoid diverticulosis and scattered colonic diverticula without active inflammatory changes. There is segmental thickening of the proximal transverse colon which may be related to underdistention. There is however apparent soft tissue nodularity at the distal portion of the thickened segment (coronal 38/19) and therefore a mass is not excluded. Further evaluation with colonoscopy after resolution of acute symptoms recommended. There is overall diffuse increase in the attenuation of the colonic content post contrast which may be related to hyperemia suggestive of possible mild colitis. No obvious focal contrast extravasation identified. There is no bowel obstruction. Appendectomy. Lymphatic: No adenopathy. Reproductive: The prostate and seminal vesicles are grossly unremarkable. Other: None Musculoskeletal: Degenerative changes with disc desiccation at L5-S1. No acute osseous pathology. IMPRESSION: 1. No definite CT evidence of active GI bleed. 2. Diarrheal state with possible mild colitis. No bowel obstruction. 3. Colonic diverticulosis. 4. Segmental thickening of the proximal transverse colon may be related to underdistention. A colonic mass is not excluded. Further evaluation with colonoscopy after resolution of acute symptoms recommended. Electronically Signed   By: Anner Crete M.D.   On: 01/29/2021 18:22    ASSESMENT:   *     Painless hematochezia.  Initially dark/black stool but then bright red hematochezia. 01/29/2021 CTAP with colon diverticulosis.  Thickening at proximal transverse colon, could be under distention but cannot rule out mass.  Diarrheal state with possible mild colitis.  No bowel obstruction. Hgb 11.8 >> 10.9.    No evidence of iron deficiency.  *     Syncope.  Orthostatic.  Dizzy when he stands up.  PLAN   *    Patient agreeable to  colonoscopy.  Planned this for Saturday 2/5.  See movie prep orders.  *    CBC this afternoon.  Hgb, hematocrit in the morning.    Azucena Freed  01/30/2021, 2:23 PM Phone [phone]

## 2021-01-30 NOTE — ED Notes (Signed)
Pt provided with water at this time. Paged update on plan.  MD made aware of patient frustration  Pt update on the plan .

## 2021-01-31 ENCOUNTER — Inpatient Hospital Stay (HOSPITAL_COMMUNITY): Payer: Medicare Other

## 2021-01-31 DIAGNOSIS — K219 Gastro-esophageal reflux disease without esophagitis: Secondary | ICD-10-CM

## 2021-01-31 DIAGNOSIS — K921 Melena: Secondary | ICD-10-CM

## 2021-01-31 DIAGNOSIS — D62 Acute posthemorrhagic anemia: Secondary | ICD-10-CM | POA: Diagnosis not present

## 2021-01-31 DIAGNOSIS — R933 Abnormal findings on diagnostic imaging of other parts of digestive tract: Secondary | ICD-10-CM | POA: Diagnosis not present

## 2021-01-31 DIAGNOSIS — K5790 Diverticulosis of intestine, part unspecified, without perforation or abscess without bleeding: Secondary | ICD-10-CM | POA: Diagnosis not present

## 2021-01-31 LAB — HEMOGLOBIN AND HEMATOCRIT, BLOOD
HCT: 18.6 % — ABNORMAL LOW (ref 39.0–52.0)
HCT: 17.5 % — ABNORMAL LOW (ref 39.0–52.0)
Hemoglobin: 6.7 g/dL — CL (ref 13.0–17.0)
Hemoglobin: 6.2 g/dL — CL (ref 13.0–17.0)

## 2021-01-31 LAB — PREPARE RBC (CROSSMATCH)

## 2021-01-31 LAB — GLUCOSE, CAPILLARY: Glucose-Capillary: 124 mg/dL — ABNORMAL HIGH (ref 70–99)

## 2021-01-31 MED ORDER — IOHEXOL 350 MG/ML SOLN
100.0000 mL | Freq: Once | INTRAVENOUS | Status: AC | PRN
  Administered 2021-01-31: 100 mL via INTRAVENOUS

## 2021-01-31 MED ORDER — SODIUM CHLORIDE 0.9% IV SOLUTION: Freq: Once | INTRAVENOUS | Status: DC

## 2021-01-31 MED ORDER — SODIUM CHLORIDE 0.9 % IV SOLN: INTRAVENOUS | Status: DC

## 2021-01-31 MED ORDER — ALPRAZOLAM 0.25 MG PO TABS: 0.2500 mg | ORAL_TABLET | Freq: Two times a day (BID) | ORAL | Status: DC | PRN

## 2021-01-31 MED ORDER — SODIUM CHLORIDE 0.9 % IV SOLN
8.0000 mg/h | INTRAVENOUS | Status: DC
  Administered 2021-01-31 – 2021-02-01 (×2): 8 mg/h via INTRAVENOUS
  Filled 2021-01-31 (×2): qty 80

## 2021-01-31 MED ORDER — PANTOPRAZOLE SODIUM 40 MG IV SOLR: 40.0000 mg | Freq: Two times a day (BID) | INTRAVENOUS | Status: DC

## 2021-01-31 MED ORDER — SODIUM CHLORIDE 0.9 % IV SOLN
80.0000 mg | Freq: Once | INTRAVENOUS | Status: AC
  Administered 2021-01-31: 80 mg via INTRAVENOUS
  Filled 2021-01-31: qty 80

## 2021-01-31 MED ORDER — SODIUM CHLORIDE 0.9% IV SOLUTION: Freq: Once | INTRAVENOUS | Status: AC

## 2021-01-31 MED ORDER — PANTOPRAZOLE SODIUM 40 MG PO TBEC: 40.0000 mg | DELAYED_RELEASE_TABLET | Freq: Every day | ORAL | Status: DC

## 2021-01-31 NOTE — Progress Notes (Signed)
Moviprep kit started at 2052. Pt. Is almost done with 1st Liter.

## 2021-01-31 NOTE — Significant Event (Signed)
Rapid Response Event Note   Reason for Call :  Syncopal episode shortly after bloody BM on [facility] Admit with GI bleed,  Plans for colonoscopy/EGD tomorrow per GI  Initial Focused Assessment:  Upon my arrival patient lying in the bed, alert and oriented.  Mildly diaphoretic.    BP 129/84  Hr 86  RR 16  O2 sat 100%  He has just completed u PRBC   Interventions:  Started NS bolus, with stable BP NS bolus stopped. Received about 150cc  Dr Cruzita Lederer at bedside Orders received to transfer to progressive care NS @ 75cc/hr  GI notified of event.  RN spoke with Azucena Freed  Plan of Care:     Event Summary:   MD Notified: Cruzita Lederer Call Time: [place] Time: 0347 End Time: [place]  Raliegh Ip, RN

## 2021-01-31 NOTE — Progress Notes (Signed)
PROGRESS NOTE  Jay Robertson WUJ:[id number] DOB: [birth year] DOA: 01/29/2021 PCP: Billie Ruddy, MD   LOS: 1 day   Brief Narrative / Interim history: 65 year old male with history of hypertension, hyperlipidemia who came into the hospital for evaluation for syncope after having a bloody bowel movement. On the morning of admission had a bowel movement which initially appeared to be dark black, then this was followed by bright red blood mixed in stool. Continue to have bleeding per rectum, became dizzy and passed out twice. He came to the hospital, initially did not want to be worked up and wanted to go home, however had recurrent bleeding in the ED and decided to stay.  Subjective / 24h Interval events: He is doing much better this morning, tells me he has not had any blood in his stool since yesterday.  No abdominal pain, no nausea or vomiting.  Assessment & Plan: Principal Problem Acute blood loss anemia, due to lower GI bleeding - gastroenterology consulted, following, discussed with Dr. Rush Landmark. CT angiogram without any significant source of bleeding. Continue to check hemoglobin every 6 hours. -Hemoglobin in the 6 range again today, however is probably recuperating as he does not have any more active bleeding.  Transfuse another unit of packed red blood cells and continue to monitor -Plan for colonoscopy and upper endoscopy tomorrow.  Active Problems Syncope -orthostatic, due to blood loss. Received a unit of packed red blood cells, continue to monitor CBC.  No further syncopal episodes  Essential hypertension -hold home medications due to syncopal episodes.  He is normotensive  Scheduled Meds: . sodium chloride   Intravenous Once  . bisacodyl  20 mg Oral Once  . peg 3350 powder  0.5 kit Oral Once   And  . peg 3350 powder  0.5 kit Oral Once   Continuous Infusions: PRN Meds:.acetaminophen **OR** acetaminophen, ondansetron **OR** ondansetron (ZOFRAN) IV  Diet Orders (From  admission, onward)    Start     Ordered   02/01/21 0500  Diet NPO time specified  Diet effective ____        01/30/21 1501   01/30/21 1322  Diet clear liquid Room service appropriate? Yes; Fluid consistency: Thin  Diet effective now       Question Answer Comment  Room service appropriate? Yes   Fluid consistency: Thin      01/30/21 1321          DVT prophylaxis: SCDs Start: 01/29/21 1951     Code Status: Full Code  Family Communication: no family at bedside   Status is: Inpatient  The patient will require care spanning > 2 midnights and should be moved to inpatient because: Inpatient level of care appropriate due to severity of illness  Dispo: The patient is from: Home              Anticipated d/c is to: Home              Anticipated d/c date is: 2 days              Patient currently is not medically stable to d/c.   Difficult to place patient No  Level of care: Med-Surg  Consultants:  GI  Procedures:  none  Microbiology  none  Antimicrobials: none    Objective: Vitals:   01/31/21 0015 01/31/21 0500 01/31/21 0947 01/31/21 1017  BP: 135/82 129/79 136/84 136/85  Pulse: 98 84 84 76  Resp: 17 17 18 17   Temp: 98.4 F (36.9 C)  98.3 F (36.8 C) 98.2 F (36.8 C) 98.1 F (36.7 C)  TempSrc: Oral Oral Oral   SpO2: 100% 100% 100%   Weight:      Height:        Intake/Output Summary (Last 24 hours) at 01/31/2021 1041 Last data filed at 01/31/2021 0700 Gross per 24 hour  Intake 540 ml  Output 250 ml  Net 290 ml   Filed Weights   01/29/21 1229  Weight: 79.8 kg    Examination:  Constitutional: No distress, in bed Eyes: No icterus ENMT: mmm Neck: normal, supple Respiratory: Lungs are clear bilaterally, no wheezing or crackles heard Cardiovascular: Regular rate and rhythm, no murmurs, no peripheral edema Abdomen: Soft, NT, ND, bowel sounds positive Musculoskeletal: no clubbing / cyanosis.  Skin: No rashes seen Neurologic: No focal deficits  Data  Reviewed: I have independently reviewed following labs and imaging studies   CBC: Recent Labs  Lab 01/29/21 1239 01/29/21 2225 01/30/21 1456 01/31/21 0528  WBC 7.9  --  8.2  --   HGB 11.8* 10.9* 8.6* 6.7*  HCT 33.6* 32.5* 23.9* 18.6*  MCV 94.9  --  90.2  --   PLT 219  --  179  --    Basic Metabolic Panel: Recent Labs  Lab 01/29/21 1239 01/30/21 1456  NA 135 137  K 4.2 3.9  CL 104 109  CO2 19* 20*  GLUCOSE 149* 125*  BUN 22 17  CREATININE 1.05 0.97  CALCIUM 8.3* 7.8*   Liver Function Tests: Recent Labs  Lab 01/29/21 1239  AST 22  ALT 28  ALKPHOS 42  BILITOT 0.8  PROT 6.3*  ALBUMIN 3.5   Coagulation Profile: No results for input(s): INR, PROTIME in the last 168 hours. HbA1C: No results for input(s): HGBA1C in the last 72 hours. CBG: No results for input(s): GLUCAP in the last 168 hours.  Recent Results (from the past 240 hour(s))  SARS CORONAVIRUS 2 (TAT 6-24 HRS) Nasopharyngeal Nasopharyngeal Swab     Status: None   Collection Time: 01/29/21  8:00 PM   Specimen: Nasopharyngeal Swab  Result Value Ref Range Status   SARS Coronavirus 2 NEGATIVE NEGATIVE Final    Comment: (NOTE) SARS-CoV-2 target nucleic acids are NOT DETECTED.  The SARS-CoV-2 RNA is generally detectable in upper and lower respiratory specimens during the acute phase of infection. Negative results do not preclude SARS-CoV-2 infection, do not rule out co-infections with other pathogens, and should not be used as the sole basis for treatment or other patient management decisions. Negative results must be combined with clinical observations, patient history, and epidemiological information. The expected result is Negative.  Fact Sheet for Patients: SugarRoll.be  Fact Sheet for Healthcare Providers: https://www.woods-mathews.com/  This test is not yet approved or cleared by the [place] FDA and  has been authorized for detection and/or  diagnosis of SARS-CoV-2 by FDA under an Emergency Use Authorization (EUA). This EUA will remain  in effect (meaning this test can be used) for the duration of the COVID-19 declaration under Se ction 564(b)(1) of the Act, 21 U.S.C. section 360bbb-3(b)(1), unless the authorization is terminated or revoked sooner.  Performed at [facility], [place] [address], [place], [place] [address]      Radiology Studies: No results found.  Marzetta Board, MD, PhD [facility]  Between 7 am - 7 pm I am available, please contact me via Amion or Securechat  Between 7 pm - 7 am I am not available, please contact night  coverage MD/APP via Amion   \

## 2021-01-31 NOTE — H&P (View-Only) (Signed)
Daily Rounding Note  01/31/2021, 11:31 AM  LOS: 1 day   SUBJECTIVE:   Chief complaint: Painless hematochezia     Last episode of bleeding was yesterday morning.  No stools or bleeding since then.  Dizziness has subsided.  He is getting his second unit of PRBCs right now.  OBJECTIVE:         Vital signs in last 24 hours:    Temp:  [98 F (36.7 C)-98.5 F (36.9 C)] 98.1 F (36.7 C) (02/04 1017) Pulse Rate:  [76-106] 76 (02/04 1017) Resp:  [12-19] 17 (02/04 1017) BP: (121-148)/(79-98) 136/85 (02/04 1017) SpO2:  [100 %] 100 % (02/04 0947) Last BM Date: 01/30/21 Filed Weights   01/29/21 1229  Weight: 79.8 kg   General: Calm, looks well.  Alert. Heart: RRR Chest: Clear bilaterally without labored breathing Abdomen: Nontender, nondistended.  Active bowel sounds. Extremities: No CCE Neuro/Psych: Alert, fully oriented.  Fluid speech.  No limb weakness.  No tremors.  Intake/Output from previous day: 02/03 0701 - 02/04 0700 In: 540 [P.O.:540] Out: 250 [Urine:250]  Intake/Output this shift: No intake/output data recorded.  Lab Results: Recent Labs    01/29/21 1239 01/29/21 2225 01/30/21 1456 01/31/21 0528  WBC 7.9  --  8.2  --   HGB 11.8* 10.9* 8.6* 6.7*  HCT 33.6* 32.5* 23.9* 18.6*  PLT 219  --  179  --    BMET Recent Labs    01/29/21 1239 01/30/21 1456  NA 135 137  K 4.2 3.9  CL 104 109  CO2 19* 20*  GLUCOSE 149* 125*  BUN 22 17  CREATININE 1.05 0.97  CALCIUM 8.3* 7.8*   LFT Recent Labs    01/29/21 1239  PROT 6.3*  ALBUMIN 3.5  AST 22  ALT 28  ALKPHOS 42  BILITOT 0.8   PT/INR No results for input(s): LABPROT, INR in the last 72 hours. Hepatitis Panel No results for input(s): HEPBSAG, HCVAB, HEPAIGM, HEPBIGM in the last 72 hours.  Studies/Results: CT Angio Abd/Pel W and/or Wo Contrast  Result Date: 01/29/2021 CLINICAL DATA:  65 year old male with GI bleed. EXAM: CTA ABDOMEN AND PELVIS  WITHOUT AND WITH CONTRAST TECHNIQUE: Multidetector CT imaging of the abdomen and pelvis was performed using the standard protocol during bolus administration of intravenous contrast. Multiplanar reconstructed images and MIPs were obtained and reviewed to evaluate the vascular anatomy. CONTRAST:  153m OMNIPAQUE IOHEXOL 350 MG/ML SOLN COMPARISON:  None. FINDINGS: VASCULAR Aorta: Mild atherosclerotic calcification. No aneurysmal dilatation or dissection. No periaortic fluid collection. Celiac: The celiac axis and its major branches are patent. There is a replaced left hepatic artery from left gastric artery. SMA: Patent without evidence of aneurysm, dissection, vasculitis or significant stenosis. Renals: Both renal arteries are patent without evidence of aneurysm, dissection, vasculitis, fibromuscular dysplasia or significant stenosis. IMA: Patent without evidence of aneurysm, dissection, vasculitis or significant stenosis. Inflow: Mild atherosclerotic calcification. No aneurysmal dilatation or dissection. The iliac arteries are patent. Proximal Outflow: Bilateral common femoral and visualized portions of the superficial and profunda femoral arteries are patent without evidence of aneurysm, dissection, vasculitis or significant stenosis. Veins: The IVC is unremarkable. The SMV, splenic vein, and main portal vein are patent. No portal venous gas. Review of the MIP images confirms the above findings. NON-VASCULAR Lower chest: The visualized lung bases are clear. No intra-abdominal free air or free fluid. Hepatobiliary: No focal liver abnormality is seen. No gallstones, gallbladder wall thickening, or biliary dilatation. Pancreas:  Unremarkable. No pancreatic ductal dilatation or surrounding inflammatory changes. Spleen: Normal in size without focal abnormality. Adrenals/Urinary Tract: The adrenal glands unremarkable. High density content within the renal collecting system on precontrast study may represent residual  contrast from prior administration. However, a 3 mm nonobstructing left renal inferior pole calculus may be present. There is no hydronephrosis on either side. There is symmetric enhancement of the kidneys. A 1 cm left renal interpolar cyst. The visualized ureters appear unremarkable. The urinary bladder is collapsed. Stomach/Bowel: There is loose stool throughout the colon compatible with diarrheal state. There is sigmoid diverticulosis and scattered colonic diverticula without active inflammatory changes. There is segmental thickening of the proximal transverse colon which may be related to underdistention. There is however apparent soft tissue nodularity at the distal portion of the thickened segment (coronal 38/19) and therefore a mass is not excluded. Further evaluation with colonoscopy after resolution of acute symptoms recommended. There is overall diffuse increase in the attenuation of the colonic content post contrast which may be related to hyperemia suggestive of possible mild colitis. No obvious focal contrast extravasation identified. There is no bowel obstruction. Appendectomy. Lymphatic: No adenopathy. Reproductive: The prostate and seminal vesicles are grossly unremarkable. Other: None Musculoskeletal: Degenerative changes with disc desiccation at L5-S1. No acute osseous pathology. IMPRESSION: 1. No definite CT evidence of active GI bleed. 2. Diarrheal state with possible mild colitis. No bowel obstruction. 3. Colonic diverticulosis. 4. Segmental thickening of the proximal transverse colon may be related to underdistention. A colonic mass is not excluded. Further evaluation with colonoscopy after resolution of acute symptoms recommended. Electronically Signed   By: Anner Crete M.D.   On: 01/29/2021 18:22   Scheduled Meds: . sodium chloride   Intravenous Once  . bisacodyl  20 mg Oral Once  . [START ON 02/01/2021] pantoprazole  40 mg Oral Q0600  . peg 3350 powder  0.5 kit Oral Once   And  .  peg 3350 powder  0.5 kit Oral Once   Continuous Infusions: PRN Meds:.acetaminophen **OR** acetaminophen, ondansetron **OR** ondansetron (ZOFRAN) IV   ASSESMENT:   *   Painless hematochezia. CTAP with angiography 2/2 shows: No active bleeding.  Diverticulosis.  Diarrheal state.  Segmental thickening in proximal transverse colon, possibly due to underdistention but mass not excluded.  *   Heartburn, reflux symptoms. ?  Gastritis,  ?  PUD  *    Blood loss anemia.  Hgb  11.8 >> 1 PRBC >>  8.6 >>  6.7 >> 1 PRBC this AM.  MCV normal.   No iron deficiency, B12, folate not checked.  BUN is not elevated.  *   Hyperglycemia.  Serum glucose Max 149.   PLAN   *   Colonoscopy/EGD tomorrow 2/5 at 0930 Added po Protonix.  See prep orders.      Jay Robertson  01/31/2021, 11:31 AM Phone [phone]

## 2021-01-31 NOTE — Anesthesia Preprocedure Evaluation (Addendum)
Anesthesia Evaluation  Patient identified by MRN, date of birth, ID band Patient awake    Reviewed: Allergy & Precautions, NPO status , Patient's Chart, lab work & pertinent test results  Airway Mallampati: II  TM Distance: >3 FB Neck ROM: Full    Dental  (+) Dental Advisory Given, Missing   Pulmonary neg pulmonary ROS,    Pulmonary exam normal breath sounds clear to auscultation       Cardiovascular hypertension, Pt. on medications Normal cardiovascular exam Rhythm:Regular Rate:Normal     Neuro/Psych negative neurological ROS     GI/Hepatic negative GI ROS, Neg liver ROS,   Endo/Other  negative endocrine ROS  Renal/GU negative Renal ROS     Musculoskeletal negative musculoskeletal ROS (+)   Abdominal   Peds  Hematology negative hematology ROS (+)   Anesthesia Other Findings   Reproductive/Obstetrics                            Anesthesia Physical Anesthesia Plan  ASA: III  Anesthesia Plan: MAC   Post-op Pain Management:    Induction: Intravenous  PONV Risk Score and Plan: 1 and Propofol infusion, TIVA and Treatment may vary due to age or medical condition  Airway Management Planned: Natural Airway  Additional Equipment:   Intra-op Plan:   Post-operative Plan:   Informed Consent: I have reviewed the patients History and Physical, chart, labs and discussed the procedure including the risks, benefits and alternatives for the proposed anesthesia with the patient or authorized representative who has indicated his/her understanding and acceptance.     Dental advisory given  Plan Discussed with: CRNA  Anesthesia Plan Comments:        Anesthesia Quick Evaluation

## 2021-01-31 NOTE — Progress Notes (Signed)
CRITICAL VALUE ALERT  Critical Value:  Hemoglobin 6.7  Date & Time Notied:  02/04/20222 8916  Provider Notified: Lorra Hals  Orders Received/Actions taken: New orders given. See orders

## 2021-01-31 NOTE — Progress Notes (Signed)
Report given to [facility] for 3E24, pt was transported there after CT scan.

## 2021-01-31 NOTE — Progress Notes (Signed)
        Daily Rounding Note  01/31/2021, 11:31 AM  LOS: 1 day   SUBJECTIVE:   Chief complaint: Painless hematochezia     Last episode of bleeding was yesterday morning.  No stools or bleeding since then.  Dizziness has subsided.  He is getting his second unit of PRBCs right now.  OBJECTIVE:         Vital signs in last 24 hours:    Temp:  [98 F (36.7 C)-98.5 F (36.9 C)] 98.1 F (36.7 C) (02/04 1017) Pulse Rate:  [76-106] 76 (02/04 1017) Resp:  [12-19] 17 (02/04 1017) BP: (121-148)/(79-98) 136/85 (02/04 1017) SpO2:  [100 %] 100 % (02/04 0947) Last BM Date: 01/30/21 Filed Weights   01/29/21 1229  Weight: 79.8 kg   General: Calm, looks well.  Alert. Heart: RRR Chest: Clear bilaterally without labored breathing Abdomen: Nontender, nondistended.  Active bowel sounds. Extremities: No CCE Neuro/Psych: Alert, fully oriented.  Fluid speech.  No limb weakness.  No tremors.  Intake/Output from previous day: 02/03 0701 - 02/04 0700 In: 540 [P.O.:540] Out: 250 [Urine:250]  Intake/Output this shift: No intake/output data recorded.  Lab Results: Recent Labs    01/29/21 1239 01/29/21 2225 01/30/21 1456 01/31/21 0528  WBC 7.9  --  8.2  --   HGB 11.8* 10.9* 8.6* 6.7*  HCT 33.6* 32.5* 23.9* 18.6*  PLT 219  --  179  --    BMET Recent Labs    01/29/21 1239 01/30/21 1456  NA 135 137  K 4.2 3.9  CL 104 109  CO2 19* 20*  GLUCOSE 149* 125*  BUN 22 17  CREATININE 1.05 0.97  CALCIUM 8.3* 7.8*   LFT Recent Labs    01/29/21 1239  PROT 6.3*  ALBUMIN 3.5  AST 22  ALT 28  ALKPHOS 42  BILITOT 0.8   PT/INR No results for input(s): LABPROT, INR in the last 72 hours. Hepatitis Panel No results for input(s): HEPBSAG, HCVAB, HEPAIGM, HEPBIGM in the last 72 hours.  Studies/Results: CT Angio Abd/Pel W and/or Wo Contrast  Result Date: 01/29/2021 CLINICAL DATA:  65-year-old male with GI bleed. EXAM: CTA ABDOMEN AND PELVIS  WITHOUT AND WITH CONTRAST TECHNIQUE: Multidetector CT imaging of the abdomen and pelvis was performed using the standard protocol during bolus administration of intravenous contrast. Multiplanar reconstructed images and MIPs were obtained and reviewed to evaluate the vascular anatomy. CONTRAST:  100mL OMNIPAQUE IOHEXOL 350 MG/ML SOLN COMPARISON:  None. FINDINGS: VASCULAR Aorta: Mild atherosclerotic calcification. No aneurysmal dilatation or dissection. No periaortic fluid collection. Celiac: The celiac axis and its major branches are patent. There is a replaced left hepatic artery from left gastric artery. SMA: Patent without evidence of aneurysm, dissection, vasculitis or significant stenosis. Renals: Both renal arteries are patent without evidence of aneurysm, dissection, vasculitis, fibromuscular dysplasia or significant stenosis. IMA: Patent without evidence of aneurysm, dissection, vasculitis or significant stenosis. Inflow: Mild atherosclerotic calcification. No aneurysmal dilatation or dissection. The iliac arteries are patent. Proximal Outflow: Bilateral common femoral and visualized portions of the superficial and profunda femoral arteries are patent without evidence of aneurysm, dissection, vasculitis or significant stenosis. Veins: The IVC is unremarkable. The SMV, splenic vein, and main portal vein are patent. No portal venous gas. Review of the MIP images confirms the above findings. NON-VASCULAR Lower chest: The visualized lung bases are clear. No intra-abdominal free air or free fluid. Hepatobiliary: No focal liver abnormality is seen. No gallstones, gallbladder wall thickening, or biliary dilatation. Pancreas:   Unremarkable. No pancreatic ductal dilatation or surrounding inflammatory changes. Spleen: Normal in size without focal abnormality. Adrenals/Urinary Tract: The adrenal glands unremarkable. High density content within the renal collecting system on precontrast study may represent residual  contrast from prior administration. However, a 3 mm nonobstructing left renal inferior pole calculus may be present. There is no hydronephrosis on either side. There is symmetric enhancement of the kidneys. A 1 cm left renal interpolar cyst. The visualized ureters appear unremarkable. The urinary bladder is collapsed. Stomach/Bowel: There is loose stool throughout the colon compatible with diarrheal state. There is sigmoid diverticulosis and scattered colonic diverticula without active inflammatory changes. There is segmental thickening of the proximal transverse colon which may be related to underdistention. There is however apparent soft tissue nodularity at the distal portion of the thickened segment (coronal 38/19) and therefore a mass is not excluded. Further evaluation with colonoscopy after resolution of acute symptoms recommended. There is overall diffuse increase in the attenuation of the colonic content post contrast which may be related to hyperemia suggestive of possible mild colitis. No obvious focal contrast extravasation identified. There is no bowel obstruction. Appendectomy. Lymphatic: No adenopathy. Reproductive: The prostate and seminal vesicles are grossly unremarkable. Other: None Musculoskeletal: Degenerative changes with disc desiccation at L5-S1. No acute osseous pathology. IMPRESSION: 1. No definite CT evidence of active GI bleed. 2. Diarrheal state with possible mild colitis. No bowel obstruction. 3. Colonic diverticulosis. 4. Segmental thickening of the proximal transverse colon may be related to underdistention. A colonic mass is not excluded. Further evaluation with colonoscopy after resolution of acute symptoms recommended. Electronically Signed   By: Arash  Radparvar M.D.   On: 01/29/2021 18:22   Scheduled Meds: . sodium chloride   Intravenous Once  . bisacodyl  20 mg Oral Once  . [START ON 02/01/2021] pantoprazole  40 mg Oral Q0600  . peg 3350 powder  0.5 kit Oral Once   And  .  peg 3350 powder  0.5 kit Oral Once   Continuous Infusions: PRN Meds:.acetaminophen **OR** acetaminophen, ondansetron **OR** ondansetron (ZOFRAN) IV   ASSESMENT:   *   Painless hematochezia. CTAP with angiography 2/2 shows: No active bleeding.  Diverticulosis.  Diarrheal state.  Segmental thickening in proximal transverse colon, possibly due to underdistention but mass not excluded.  *   Heartburn, reflux symptoms. ?  Gastritis,  ?  PUD  *    Blood loss anemia.  Hgb  11.8 >> 1 PRBC >>  8.6 >>  6.7 >> 1 PRBC this AM.  MCV normal.   No iron deficiency, B12, folate not checked.  BUN is not elevated.  *   Hyperglycemia.  Serum glucose Max 149.   PLAN   *   Colonoscopy/EGD tomorrow 2/5 at 0930 Added po Protonix.  See prep orders.      Sarah Gribbin  01/31/2021, 11:31 AM Phone [phone] 

## 2021-01-31 NOTE — Progress Notes (Addendum)
Pt critical lab result called by Naomie Dean. Hemoglobin of 6.2; Dr. Cruzita Lederer made aware during round on pt; MD order x3 units of PRBC; Consent in chart; Blood retrieved and started per policy. Will continue to monitor.

## 2021-02-01 ENCOUNTER — Inpatient Hospital Stay (HOSPITAL_COMMUNITY): Payer: Medicare Other | Admitting: Anesthesiology

## 2021-02-01 ENCOUNTER — Inpatient Hospital Stay (HOSPITAL_COMMUNITY): Payer: Medicare Other

## 2021-02-01 ENCOUNTER — Encounter (HOSPITAL_COMMUNITY)
Admission: EM | Disposition: A | Discharge status: Home / Self Care | Payer: Self-pay | Source: Home / Self Care | Attending: Internal Medicine

## 2021-02-01 ENCOUNTER — Encounter (HOSPITAL_COMMUNITY): Payer: Self-pay | Admitting: Internal Medicine

## 2021-02-01 ENCOUNTER — Encounter (HOSPITAL_COMMUNITY): Payer: Self-pay | Admitting: Certified Registered Nurse Anesthetist

## 2021-02-01 DIAGNOSIS — I10 Essential (primary) hypertension: Secondary | ICD-10-CM | POA: Diagnosis not present

## 2021-02-01 DIAGNOSIS — K635 Polyp of colon: Secondary | ICD-10-CM

## 2021-02-01 DIAGNOSIS — R55 Syncope and collapse: Secondary | ICD-10-CM

## 2021-02-01 DIAGNOSIS — K922 Gastrointestinal hemorrhage, unspecified: Secondary | ICD-10-CM

## 2021-02-01 DIAGNOSIS — K625 Hemorrhage of anus and rectum: Secondary | ICD-10-CM

## 2021-02-01 DIAGNOSIS — K2970 Gastritis, unspecified, without bleeding: Secondary | ICD-10-CM

## 2021-02-01 HISTORY — PX: BIOPSY: SHX5522

## 2021-02-01 HISTORY — PX: COLONOSCOPY WITH PROPOFOL: SHX5780

## 2021-02-01 HISTORY — PX: ESOPHAGOGASTRODUODENOSCOPY (EGD) WITH PROPOFOL: SHX5813

## 2021-02-01 LAB — HEMOGLOBIN AND HEMATOCRIT, BLOOD
HCT: 24.1 % — ABNORMAL LOW (ref 39.0–52.0)
HCT: 19.4 % — ABNORMAL LOW (ref 39.0–52.0)
Hemoglobin: 8 g/dL — ABNORMAL LOW (ref 13.0–17.0)
Hemoglobin: 6.3 g/dL — CL (ref 13.0–17.0)

## 2021-02-01 LAB — PREPARE RBC (CROSSMATCH)

## 2021-02-01 SURGERY — CANCELLED PROCEDURE

## 2021-02-01 SURGERY — ESOPHAGOGASTRODUODENOSCOPY (EGD) WITH PROPOFOL
Anesthesia: Monitor Anesthesia Care

## 2021-02-01 MED ORDER — NOREPINEPHRINE 4 MG/250ML-% IV SOLN: 2.0000 ug/min | INTRAVENOUS | Status: DC

## 2021-02-01 MED ORDER — CALCIUM GLUCONATE-NACL 2-0.675 GM/100ML-% IV SOLN
2.0000 g | Freq: Once | INTRAVENOUS | Status: AC
  Administered 2021-02-01: 2000 mg via INTRAVENOUS
  Filled 2021-02-01: qty 100

## 2021-02-01 MED ORDER — LACTATED RINGERS IV BOLUS
1000.0000 mL | Freq: Once | INTRAVENOUS | Status: AC
  Administered 2021-02-01: 1000 mL via INTRAVENOUS

## 2021-02-01 MED ORDER — SODIUM PERTECHNETATE TC 99M INJECTION
22.0000 | Freq: Once | INTRAVENOUS | Status: AC | PRN
  Administered 2021-02-01: 22 via INTRAVENOUS

## 2021-02-01 MED ORDER — SODIUM CHLORIDE 0.9 % IV SOLN
250.0000 mL | INTRAVENOUS | Status: DC
  Administered 2021-02-06: 250 mL via INTRAVENOUS

## 2021-02-01 MED ORDER — PROPOFOL 10 MG/ML IV BOLUS
INTRAVENOUS | Status: DC | PRN
  Administered 2021-02-01 (×5): 20 mg via INTRAVENOUS

## 2021-02-01 MED ORDER — SODIUM CHLORIDE 0.9% IV SOLUTION: Freq: Once | INTRAVENOUS | Status: DC

## 2021-02-01 MED ORDER — NOREPINEPHRINE 4 MG/250ML-% IV SOLN
INTRAVENOUS | Status: AC
  Administered 2021-02-01: 5 ug/kg/min
  Filled 2021-02-01: qty 250

## 2021-02-01 MED ORDER — SODIUM CHLORIDE 0.9 % IV BOLUS
1000.0000 mL | Freq: Once | INTRAVENOUS | Status: AC
  Administered 2021-02-01: 1000 mL via INTRAVENOUS

## 2021-02-01 MED ORDER — CHLORHEXIDINE GLUCONATE CLOTH 2 % EX PADS
6.0000 | MEDICATED_PAD | Freq: Every day | CUTANEOUS | Status: DC
  Administered 2021-02-02 – 2021-02-03 (×2): 6 via TOPICAL

## 2021-02-01 MED ORDER — PANTOPRAZOLE SODIUM 40 MG PO TBEC
40.0000 mg | DELAYED_RELEASE_TABLET | Freq: Two times a day (BID) | ORAL | Status: DC
Start: 2021-02-01 — End: 2021-02-11
  Administered 2021-02-01 – 2021-02-11 (×19): 40 mg via ORAL
  Filled 2021-02-01 (×20): qty 1

## 2021-02-01 MED ORDER — NOREPINEPHRINE 4 MG/250ML-% IV SOLN: 0.0000 ug/min | INTRAVENOUS | Status: DC

## 2021-02-01 MED ORDER — PROPOFOL 500 MG/50ML IV EMUL
INTRAVENOUS | Status: DC | PRN
  Administered 2021-02-01: 100 ug/kg/min via INTRAVENOUS

## 2021-02-01 MED ORDER — SODIUM CHLORIDE 0.9% IV SOLUTION: Freq: Once | INTRAVENOUS | Status: AC

## 2021-02-01 MED ORDER — LACTATED RINGERS IV SOLN
INTRAVENOUS | Status: AC | PRN
  Administered 2021-02-01: 1000 mL via INTRAVENOUS

## 2021-02-01 SURGICAL SUPPLY — 21 items

## 2021-02-01 SURGICAL SUPPLY — 25 items

## 2021-02-01 NOTE — Progress Notes (Signed)
Pt has had 4 loose watery stools with bright red blood within the hour. Pt has almost completed second round of moviprep. Stools contain very small clots but are all liquid.

## 2021-02-01 NOTE — Progress Notes (Signed)
Observed pt transporting to procedure at change of shift.

## 2021-02-01 NOTE — Op Note (Signed)
[facility] Patient Name: Jay Robertson Procedure Date : 02/01/2021 MRN: [id number] Attending MD: Justice Britain , MD Date of Birth: May 01, 1956 CSN: [id number] Age: 65 Admit Type: Inpatient Procedure:                Upper GI endoscopy Indications:              Acute post hemorrhagic anemia, Heartburn,                            Hematochezia Providers:                Justice Britain, MD, Ladona Ridgel, Technician,                            Glori Bickers, RN Referring MD:             [facility], Billie Ruddy Medicines:                Monitored Anesthesia Care Complications:            No immediate complications. Estimated Blood Loss:     Estimated blood loss was minimal. Procedure:                Pre-Anesthesia Assessment:                           - Prior to the procedure, a History and Physical                            was performed, and patient medications and                            allergies were reviewed. The patient's tolerance of                            previous anesthesia was also reviewed. The risks                            and benefits of the procedure and the sedation                            options and risks were discussed with the patient.                            All questions were answered, and informed consent                            was obtained. Prior Anticoagulants: The patient has                            taken no previous anticoagulant or antiplatelet                            agents. ASA Grade Assessment: III - A patient with  severe systemic disease. After reviewing the risks                            and benefits, the patient was deemed in                            satisfactory condition to undergo the procedure.                           After obtaining informed consent, the endoscope was                            passed under direct vision. Throughout the                             procedure, the patient's blood pressure, pulse, and                            oxygen saturations were monitored continuously. The                            GIF-H190 ([id number]) Olympus gastroscope was                            introduced through the mouth, and advanced to the                            third part of duodenum. The upper GI endoscopy was                            accomplished without difficulty. The patient                            tolerated the procedure. Scope In: Scope Out: Findings:      No gross lesions were noted in the entire esophagus.      The Z-line was regular and was found 41 cm from the incisors.      A 1 cm hiatal hernia was present.      Patchy moderate inflammation characterized by congestion (edema),       erosions, erythema and friability was found in the entire examined       stomach.      Few non-bleeding linear gastric ulcers with a clean ulcer base (Forrest       Class III) were found in the gastric body. The largest lesion was 6 mm       in largest dimension.      No other gross lesions were noted in the entire examined stomach.       Biopsies were taken with a cold forceps for histology and Helicobacter       pylori testing.      Localized mildly erythematous mucosa without active bleeding and with no       stigmata of bleeding was found in the duodenal bulb.      No other gross lesions were noted in the duodenal bulb, in the first       portion of the duodenum  and in the second portion of the duodenum.       Biopsies were taken with a cold forceps for histology and Helicobacter       pylori testing. Impression:               - No gross lesions in esophagus. Z-line regular, 41                            cm from the incisors.                           - 1 cm hiatal hernia.                           - Gastritis. Non-bleeding gastric ulcers with a                            clean ulcer base (Forrest Class III). No other                             gross lesions in the stomach. Biopsied.                           - Erythematous duodenopathy in bulb. No other gross                            lesions in the duodenal bulb, in the first portion                            of the duodenum and in the second portion of the                            duodenum. Biopsied. Recommendation:           - Proceed to scheduled colonoscopy.                           - Transition to PO PPI 40 mg BID.                           - Await pathology results.                           - Observe patient's clinical course.                           - Repeat upper endoscopy in 3-4 months to check                            healing.                           - The findings and recommendations were discussed                            with the patient.                           -  The findings and recommendations were discussed                            with the designated responsible adult. Procedure Code(s):        --- Professional ---                           [id number], Esophagogastroduodenoscopy, flexible,                            transoral; with biopsy, single or multiple Diagnosis Code(s):        --- Professional ---                           K44.9, Diaphragmatic hernia without obstruction or                            gangrene                           K29.70, Gastritis, unspecified, without bleeding                           K25.9, Gastric ulcer, unspecified as acute or                            chronic, without hemorrhage or perforation                           K31.89, Other diseases of stomach and duodenum                           D62, Acute posthemorrhagic anemia                           R12, Heartburn                           K92.1, Melena (includes Hematochezia) CPT copyright 2019 [employer]. All rights reserved. The codes documented in this report are preliminary and upon coder review may  be revised to meet current  compliance requirements. Justice Britain, MD 02/01/2021 9:22:30 AM Number of Addenda: 0

## 2021-02-01 NOTE — Progress Notes (Signed)
After hearing about the patient returning upstairs to his room with significant bright red blood per rectum and clots, decision was made to try to bring him back down for an endoscopic evaluation.  However, blood pressures did not allow that to be the case and he was transferred to the intensive care unit with plan for mesenteric arteriogram versus tagged RBC. We will cancel the planned repeat flexible sigmoidoscopy/colonoscopy and allow the medical team and ICU team to further discuss case with interventional radiology. We will follow.  Justice Britain, MD [facility] Advanced [facility]oscopy Office # [phone]

## 2021-02-01 NOTE — Progress Notes (Signed)
Blood bank made [place] aware of a shortage. Stated they can not give 3rd unit of blood until the pts H&H results this AM. MD notified. 2nd unit of blood is complete. Pt still having frequent bloody stools due to bowel prep.

## 2021-02-01 NOTE — Procedures (Signed)
Central Venous Catheter Insertion Procedure Note  CHAYSON CHARTERS  [id number]  [birth year]  Date:02/01/21  Time:3:43 PM   Provider Performing:Durand Wittmeyer   Procedure: Insertion of Non-tunneled Central Venous [phone]) with US guidance ([id number])   Indication(s) Medication administration  Consent Risks of the procedure as well as the alternatives and risks of each were explained to the patient and/or caregiver.  Consent for the procedure was obtained and is signed in the bedside chart  Anesthesia Topical only with 1% lidocaine   Timeout Verified patient identification, verified procedure, site/side was marked, verified correct patient position, special equipment/implants available, medications/allergies/relevant history reviewed, required imaging and test results available.  Sterile Technique Maximal sterile technique including full sterile barrier drape, hand hygiene, sterile gown, sterile gloves, mask, hair covering, sterile ultrasound probe cover (if used).  Procedure Description Area of catheter insertion was cleaned with chlorhexidine and draped in sterile fashion.  With real-time ultrasound guidance a introducer sheath was placed into the right internal jugular vein. Nonpulsatile blood flow and easy flushing noted in all ports.  The catheter was sutured in place and sterile dressing applied.  Complications/Tolerance None; patient tolerated the procedure well. Chest X-ray is ordered to verify placement for internal jugular or subclavian cannulation.   Chest x-ray is not ordered for femoral cannulation.  EBL Minimal  Specimen(s) None

## 2021-02-01 NOTE — Progress Notes (Signed)
Pt continuously bleeding 6 pan in 45 mins. MD aware Bolus running and Blood transfusion with no reaction.

## 2021-02-01 NOTE — Significant Event (Signed)
Patient is bleeding from rectum large clots. X4 in last 15 mins GI  Pa and MD. Plan go back into procedure.

## 2021-02-01 NOTE — Interval H&P Note (Signed)
History and Physical Interval Note:  02/01/2021 7:26 AM  [name]  has presented today for surgery, with the diagnosis of Painless hematochezia.  Blood loss anemia..  The various methods of treatment have been discussed with the patient and family. After consideration of risks, benefits and other options for treatment, the patient has consented to  Procedure(s): ESOPHAGOGASTRODUODENOSCOPY (EGD) WITH PROPOFOL (N/A) COLONOSCOPY WITH PROPOFOL (N/A) as a surgical intervention.  The patient's history has been reviewed, patient examined, no change in status, stable for surgery.  I have reviewed the patient's chart and labs.  Questions were answered to the patient's satisfaction.     [employer]

## 2021-02-01 NOTE — Transfer of Care (Signed)
Immediate Anesthesia Transfer of Care Note  Patient: Jay Robertson  Procedure(s) Performed: ESOPHAGOGASTRODUODENOSCOPY (EGD) WITH PROPOFOL (N/A ) COLONOSCOPY WITH PROPOFOL (N/A ) BIOPSY  Patient Location: PACU  Anesthesia Type:MAC  Level of Consciousness: drowsy  Airway & Oxygen Therapy: Patient Spontanous Breathing  Post-op Assessment: Report given to RN and Post -op Vital signs reviewed and stable  Post vital signs: Reviewed and stable  Last Vitals:  Vitals Value Taken Time  BP    Temp    Pulse    Resp    SpO2      Last Pain:  Vitals:   02/01/21 0722  TempSrc: Oral  PainSc: 0-No pain         Complications: No complications documented.

## 2021-02-01 NOTE — Op Note (Signed)
[facility] Patient Name: Jay Robertson Procedure Date : 02/01/2021 MRN: [id number] Attending MD: Justice Britain , MD Date of Birth: 06-13-1956 CSN: [id number] Age: 65 Admit Type: Inpatient Procedure:                Colonoscopy Indications:              Evaluation of abnormal imaging study (likely to be                            clinically significant), Hematochezia, Acute post                            hemorrhagic anemia, Diverticula Providers:                Justice Britain, MD, Ladona Ridgel, Technician,                            Glori Bickers, RN Referring MD:             Billie Ruddy, [facility] Medicines:                Monitored Anesthesia Care Complications:            No immediate complications. Estimated Blood Loss:     Estimated blood loss: none. Procedure:                Pre-Anesthesia Assessment:                           - Prior to the procedure, a History and Physical                            was performed, and patient medications and                            allergies were reviewed. The patient's tolerance of                            previous anesthesia was also reviewed. The risks                            and benefits of the procedure and the sedation                            options and risks were discussed with the patient.                            All questions were answered, and informed consent                            was obtained. Prior Anticoagulants: The patient has                            taken no previous anticoagulant or antiplatelet  agents. ASA Grade Assessment: III - A patient with                            severe systemic disease. After reviewing the risks                            and benefits, the patient was deemed in                            satisfactory condition to undergo the procedure.                           After obtaining informed consent, the colonoscope                             was passed under direct vision. Throughout the                            procedure, the patient's blood pressure, pulse, and                            oxygen saturations were monitored continuously. The                            CF-HQ190L ([id number]) Olympus colonoscope was                            introduced through the anus and advanced to the 8                            cm into the ileum. The colonoscopy was performed                            without difficulty. The patient tolerated the                            procedure. The quality of the bowel preparation was                            adequate. The terminal ileum, ileocecal valve,                            appendiceal orifice, and rectum were photographed. Scope In: 8:14:35 AM Scope Out: 9:03:22 AM Scope Withdrawal Time: 0 hours 44 minutes 8 seconds  Total Procedure Duration: 0 hours 48 minutes 47 seconds  Findings:      The digital rectal exam findings include hemorrhoids. Pertinent       negatives include no palpable rectal lesions.      Clotted blood and red blood was found in the entire colon. Lavage and       suction of the area was performed using copious amounts, resulting in       clearance with adequate visualization.      The terminal ileum appeared normal. This was watched and lavaged to  ensure no evidence of active ongoing bleeding from above for a few       minutes and no evidence of blood was noted.      Multiple small-mouthed diverticula were found in the recto-sigmoid       colon, sigmoid colon, descending colon, hepatic flexure and ascending       colon.      A 8 mm polyp was found in the hepatic flexure. The polyp was       semi-sessile. Polypectomy was not attempted in setting of evaluation for       active recent GI bleeding.      Non-bleeding non-thrombosed external and internal hemorrhoids were found       during retroflexion, during perianal exam and during digital exam.  The       hemorrhoids were Grade II (internal hemorrhoids that prolapse but reduce       spontaneously).      I evaluated the colon on 4 separate pull-throughs from TI to rectum in       effort of localizing any active bleeding, and nothing was found. Impression:               - Hemorrhoids found on digital rectal exam.                           - The examined portion of the ileum was normal - no                            bleeding noted coming from above.                           - One 8 mm polyp at the hepatic flexure. Resection                            not attempted.                           - Diverticulosis in the recto-sigmoid colon, in the                            sigmoid colon, in the descending colon, at the                            hepatic flexure and in the ascending colon.                           - Blood in the entire examined colon - lavaged and                            suctioned with adequate visualization.                           - Non-bleeding non-thrombosed external and internal                            hemorrhoids.                           -  I evaluated the colon on 4 separate pull-throughs                            from TI to rectum in effort of localizing any                            active bleeding, and nothing was found. Recommendation:           - The patient will be observed post-procedure,                            until all discharge criteria are met.                           - Return patient to hospital ward for ongoing care.                           - Advance diet as tolerated.                           - Trend Hgb/Hct.                           - Would offer patient IV Iron infusion while in                            house.                           - If patient has ongoing small volume bleeding                            would recommend a Tagged RBC scan to see if it can                            be localized to the right colon or  left colon in                            regards to the diverticular burden. If this is                            performed and negative can consider a Meckel's scan                            as well. I think CT-Angiography if unstable vs                            IR-Mesenteric angiography is next step but                            localization with Tagged may be required.                           - Repeat colonoscopy within next 1-year for  colon                            polyp removal and true colon cancer screening.                           - The findings and recommendations were discussed                            with the patient.                           - The findings and recommendations were discussed                            with the designated responsible adult. Procedure Code(s):        --- Professional ---                           [phone], Colonoscopy, flexible; diagnostic, including                            collection of specimen(s) by brushing or washing,                            when performed (separate procedure) Diagnosis Code(s):        --- Professional ---                           K64.1, Second degree hemorrhoids                           K57.30, Diverticulosis of large intestine without                            perforation or abscess without bleeding                           K63.5, Polyp of colon                           K92.2, Gastrointestinal hemorrhage, unspecified                           K92.1, Melena (includes Hematochezia)                           D62, Acute posthemorrhagic anemia                           R93.3, Abnormal findings on diagnostic imaging of                            other parts of digestive tract CPT copyright 2019 American Medical Association. All rights reserved. The codes documented in this report are preliminary and upon coder review may  be revised to meet current compliance requirements. Corliss Parish, MD 02/01/2021  9:32:16 AM Number  of Addenda: 0

## 2021-02-01 NOTE — Significant Event (Signed)
Pt is alert oriented with continues blood stools with clots 7 half Bedpans, Blood infusing, 500 bolus complete. Vitals stable. Plan for colonoscopy per GI PA around Noon. And GI NM study ordered as well. Transferring to [place] before or after procedure.

## 2021-02-01 NOTE — Plan of Care (Signed)

## 2021-02-01 NOTE — Progress Notes (Signed)
PROGRESS NOTE  Jay Robertson [id number] DOB: 1956/08/16 DOA: 01/29/2021 PCP: Billie Ruddy, MD   LOS: 2 days   Brief Narrative / Interim history: 65 year old male with history of hypertension, hyperlipidemia who came into the hospital for evaluation for syncope after having a bloody bowel movement. On the morning of admission had a bowel movement which initially appeared to be dark black, then this was followed by bright red blood mixed in stool. Continue to have bleeding per rectum, became dizzy and passed out twice. He came to the hospital, initially did not want to be worked up and wanted to go home, however had recurrent bleeding in the ED and decided to stay.  Subjective / 24h Interval events: He was able to do the prep overnight, continues to see blood with liquid stools.  No lightheadedness, no dizziness, no further syncopal episodes  Assessment & Plan: Principal Problem Acute blood loss anemia, due to lower GI bleeding - gastroenterology consulted, following, discussed with Dr. Rush Landmark. CT angiogram without any significant source of bleeding. Continue to check hemoglobin every 6 hours. -Has received so far total of 4 units of packed red blood cells -Scheduled for colonoscopy and upper endoscopy today  Active Problems Syncope -orthostatic, due to blood loss. Received a unit of packed red blood cells, continue to monitor CBC.  No further syncopal episodes  Essential hypertension -hold home medications due to syncopal episodes.  He is normotensive  Scheduled Meds: . sodium chloride   Intravenous Once  . sodium chloride   Intravenous Once   Continuous Infusions: . sodium chloride 75 mL/hr at 02/01/21 0305  . pantoprozole (PROTONIX) infusion 8 mg/hr (02/01/21 0605)   PRN Meds:.acetaminophen **OR** acetaminophen, ALPRAZolam, ondansetron **OR** ondansetron (ZOFRAN) IV  Diet Orders (From admission, onward)    Start     Ordered   02/01/21 0500  Diet NPO time specified  Diet  effective ____        01/30/21 1501          DVT prophylaxis: SCDs Start: 01/29/21 1951     Code Status: Full Code  Family Communication: Wife at bedside  Status is: Inpatient  The patient will require care spanning > 2 midnights and should be moved to inpatient because: Inpatient level of care appropriate due to severity of illness  Dispo: The patient is from: Home              Anticipated d/c is to: Home              Anticipated d/c date is: 2 days              Patient currently is not medically stable to d/c.   Difficult to place patient No  Level of care: Progressive  Consultants:  GI  Procedures:  none  Microbiology  none  Antimicrobials: none    Objective: Vitals:   01/31/21 2326 02/01/21 0000 02/01/21 0001 02/01/21 0235  BP: 140/82 (!) 180/96 (!) 165/92 (!) 167/90  Pulse: 82 95 97 87  Resp: 16 19 19 17   Temp: 98.1 F (36.7 C) 98.4 F (36.9 C)  98.2 F (36.8 C)  TempSrc: Oral Oral  Oral  SpO2: 100% 100% 100% 100%  Weight:    79.2 kg  Height:        Intake/Output Summary (Last 24 hours) at 02/01/2021 0708 Last data filed at 02/01/2021 [id number] Gross per 24 hour  Intake 3065.21 ml  Output 1100 ml  Net 1965.21 ml   [employer]  01/29/21 1229 02/01/21 0235  Weight: 79.8 kg 79.2 kg    Examination:  Constitutional: NAD, in bed Eyes: No icterus ENMT: Moist mucous membranes Neck: normal, supple Respiratory: Clear bilaterally, no wheezing or crackles Cardiovascular: Regular rate and rhythm, no murmurs, no edema Abdomen: Soft, nontender, nondistended, bowel sounds positive Musculoskeletal: no clubbing / cyanosis.  Skin: No rashes seen Neurologic: Nonfocal, equal strength  Data Reviewed: I have independently reviewed following labs and imaging studies   CBC: Recent Labs  Lab 01/29/21 1239 01/29/21 2225 01/30/21 1456 01/31/21 0528 01/31/21 1624  WBC 7.9  --  8.2  --   --   HGB 11.8* 10.9* 8.6* 6.7* 6.2*  HCT 33.6* 32.5* 23.9* 18.6*  17.5*  MCV 94.9  --  90.2  --   --   PLT 219  --  179  --   --    Basic Metabolic Panel: Recent Labs  Lab 01/29/21 1239 01/30/21 1456  NA 135 137  K 4.2 3.9  CL 104 109  CO2 19* 20*  GLUCOSE 149* 125*  BUN 22 17  CREATININE 1.05 0.97  CALCIUM 8.3* 7.8*   Liver Function Tests: Recent Labs  Lab 01/29/21 1239  AST 22  ALT 28  ALKPHOS 42  BILITOT 0.8  PROT 6.3*  ALBUMIN 3.5   Coagulation Profile: No results for input(s): INR, PROTIME in the last 168 hours. HbA1C: No results for input(s): HGBA1C in the last 72 hours. CBG: Recent Labs  Lab 01/31/21 1235  GLUCAP 124*    Recent Results (from the past 240 hour(s))  SARS CORONAVIRUS 2 (TAT 6-24 HRS) Nasopharyngeal Nasopharyngeal Swab     Status: None   Collection Time: 01/29/21  8:00 PM   Specimen: Nasopharyngeal Swab  Result Value Ref Range Status   SARS Coronavirus 2 NEGATIVE NEGATIVE Final    Comment: (NOTE) SARS-CoV-2 target nucleic acids are NOT DETECTED.  The SARS-CoV-2 RNA is generally detectable in upper and lower respiratory specimens during the acute phase of infection. Negative results do not preclude SARS-CoV-2 infection, do not rule out co-infections with other pathogens, and should not be used as the sole basis for treatment or other patient management decisions. Negative results must be combined with clinical observations, patient history, and epidemiological information. The expected result is Negative.  Fact Sheet for Patients: SugarRoll.be  Fact Sheet for Healthcare Providers: https://www.woods-mathews.com/  This test is not yet approved or cleared by the [place] FDA and  has been authorized for detection and/or diagnosis of SARS-CoV-2 by FDA under an Emergency Use Authorization (EUA). This EUA will remain  in effect (meaning this test can be used) for the duration of the COVID-19 declaration under Se ction 564(b)(1) of the Act, 21  U.S.C. section 360bbb-3(b)(1), unless the authorization is terminated or revoked sooner.  Performed at [facility], [place] [address], [place], [place] [address]      Radiology Studies: CT Angio Abd/Pel w/ and/or w/o  Result Date: 01/31/2021 CLINICAL DATA:  Bright red GI bleeding EXAM: CTA ABDOMEN AND PELVIS WITHOUT AND WITH CONTRAST TECHNIQUE: Multidetector CT imaging of the abdomen and pelvis was performed using the standard protocol during bolus administration of intravenous contrast. Multiplanar reconstructed images and MIPs were obtained and reviewed to evaluate the vascular anatomy. CONTRAST:  129mL OMNIPAQUE IOHEXOL 350 MG/ML SOLN COMPARISON:  CTA abdomen and pelvis dated 01/29/2021 FINDINGS: VASCULAR Aorta: Normal caliber aorta without aneurysm, dissection, vasculitis or significant stenosis. Trace atherosclerotic calcifications along the abdominal aorta. Celiac: Patent without evidence  of aneurysm, dissection, vasculitis or significant stenosis. The left hepatic artery is replaced to the left gastric artery. SMA: Patent without evidence of aneurysm, dissection, vasculitis or significant stenosis. Renals: Both renal arteries are patent without evidence of aneurysm, dissection, vasculitis, fibromuscular dysplasia or significant stenosis. IMA: Patent without evidence of aneurysm, dissection, vasculitis or significant stenosis. Inflow: Patent without evidence of aneurysm, dissection, vasculitis or significant stenosis. Proximal Outflow: Bilateral common femoral and visualized portions of the superficial and profunda femoral arteries are patent without evidence of aneurysm, dissection, vasculitis or significant stenosis. Veins: No focal venous abnormality. Review of the MIP images confirms the above findings. NON-VASCULAR Lower chest: No acute abnormality. Hepatobiliary: No focal liver abnormality is seen. No gallstones, gallbladder wall thickening, or biliary dilatation. High attenuation material  within the gallbladder lumen favored to reflect vicarious excretion of contrast material. Pancreas: Unremarkable. No pancreatic ductal dilatation or surrounding inflammatory changes. Spleen: Normal in size without focal abnormality. Adrenals/Urinary Tract: Adrenal glands are unremarkable. Kidneys are normal, without renal calculi, focal l solid lesion, or hydronephrosis. Bladder is unremarkable. Small simple cyst exophytic from the lower pole of the left kidney. Stomach/Bowel: Stomach is within normal limits. Appendix appears normal. No evidence of bowel wall thickening, distention, or inflammatory changes. There are a few scattered colonic diverticula but no evidence of active diverticulitis. Lymphatic: No suspicious lymphadenopathy. Reproductive: Prostate is unremarkable. Other: No abdominal wall hernia or abnormality. No abdominopelvic ascites. Musculoskeletal: No acute or significant osseous findings. Focal L5-S1 degenerative disc disease. IMPRESSION: 1. No evidence of active GI bleed at this time. 2. Mild colonic diverticulosis without evidence of diverticulitis. Electronically Signed   By: Jacqulynn Cadet M.D.   On: 01/31/2021 15:09    Marzetta Board, MD, PhD [facility]  Between 7 am - 7 pm I am available, please contact me via Amion or Securechat  Between 7 pm - 7 am I am not available, please contact night coverage MD/APP via Amion   \

## 2021-02-01 NOTE — Progress Notes (Signed)
Patient transferred back to floor on monitory by this RN. Receiving RN at bedside for transfer. Patient assisted out of bed to bathroom once patient arrived on unit. Patient passed several clumps of bright red blood clots on way to bathroom. Floor RN to call MD, this RN called endo team to notify them of this event.

## 2021-02-01 NOTE — Consult Note (Signed)
NAME:  Jay Robertson, MRN:  [id number], DOB:  [birth year], LOS: 2 ADMISSION DATE:  01/29/2021, CONSULTATION DATE:  02/01/21 REFERRING MD:  TRH CHIEF COMPLAINT:  hematochezia  Brief History:  65 year old admitted with bright red blood per rectum with intermittent hematochezia whom we are consulted for ongoing large-volume hematochezia.  History of Present Illness:  Patient mid 01/29/2021 after episode of syncope following bloody bowel movement.  Presumed orthostatic versus vasovagal.  Initial stool reportedly dark followed by bright red blood.  700 cc noted per EMS note.  To negative CTA angiograms for acute bleed.  The last couple days he he has had endoscopy and colonoscopy which showed no evidence of upper bleed, blood throughout the large intestine and bilateral diverticulosis.  He has intermittent hematochezia throughout the stay.  It comes and goes.  Seems to self resolved.  On the morning of 02/01/2021 he had several episodes of very large volume hematochezia.  This prompted PCCM consultation.  Overall he feels well.  Denies any GI distress.  Is concerned with ongoing bloody bowel movements.  Discussed care with hospitalist, GI, VIR.  Recommendations were to pursue tagged red blood cell scan given lack of localization on prior interventions.  Ideally would hold off until tomorrow but suspect he will need this done in the interim given mild reduction in blood pressure over the last several hours.  Past Medical History:  Hypertension  Significant Hospital Events:  CTA angio abdomen pelvis 2/2 was negative CTA angio abdomen pelvis 2/4 was negative Colonoscopy and upper endoscopy completed 02/01/2021 showed no evidence of bleed in the upper, no evidence of bleed with exam of distal ileum on colonoscopy, blood throughout the large  intestine with bilateral diverticulosis  Consults:  PCCM GI VIR  Procedures:  EGD and colonoscopy 02/01/2021  Significant Diagnostic Tests:  CT abdomen pelvis  angiogram 2/2, 2/4 both negative  Micro Data:  H. pylori pending from biopsy on endoscopy  Antimicrobials:  N/A  Interim History / Subjective:  As above  Objective   Blood pressure 129/79, pulse 81, temperature 97.7 F (36.5 C), temperature source Oral, resp. rate 12, height 5\' 11"  (1.803 m), weight 79.2 kg, SpO2 100 %.        Intake/Output Summary (Last 24 hours) at 02/01/2021 1127 Last data filed at 02/01/2021 9476 Gross per 24 hour  Intake 3665.21 ml  Output 1100 ml  Net 2565.21 ml   Filed Weights   01/29/21 1229 02/01/21 0235  Weight: 79.8 kg 79.2 kg    Examination: General: well appearing, in NAD Eyes: EOMI, no icterus Abd: NT, ND CV: RRR, no murmur Pulm: NWOB, on RA  Resolved Hospital Problem list   n/a  Assessment & Plan:  Hematochezia: EGD w/o source of bleed, colo with blood in large intestine, noted ascending and descending diverticula. Suspected diverticular bleed. Stuttering, frustrating unable to stop. --appreciate GI inout, recommend VIR --VIR c/s, recommend tagged RBC scan, will try to obtain when evidence of bleeding --T&S --maintain at least 2 large bore IVs --q8 H&H, more frequent as indicated --Hgb goal > 7  Gastric ulcers: non bleeding --PPI --H pylori, biopsies pending  HTN: --Hold home anti-hypertensives   Best practice (evaluated daily)  Diet: NPO Pain/Anxiety/Delirium protocol (if indicated): n/a VAP protocol (if indicated): n/a DVT prophylaxis: chemoprophylaxis contraindicaed in setting of GI bleed GI prophylaxis: PPI Glucose control: n/a Mobility: bed rest for now Disposition: ICU  Goals of Care:  Last date of multidisciplinary goals of care discussion:n/a Family and staff present:  Summary of discussion:  Follow up goals of care discussion due: 2/11 Code Status: Full  Labs   CBC: Recent Labs  Lab 01/29/21 1239 01/29/21 2225 01/30/21 1456 01/31/21 0528 01/31/21 1624 02/01/21 0544  WBC 7.9  --  8.2  --   --   --    HGB 11.8* 10.9* 8.6* 6.7* 6.2* 8.0*  HCT 33.6* 32.5* 23.9* 18.6* 17.5* 24.1*  MCV 94.9  --  90.2  --   --   --   PLT 219  --  179  --   --   --     Basic Metabolic Panel: Recent Labs  Lab 01/29/21 1239 01/30/21 1456  NA 135 137  K 4.2 3.9  CL 104 109  CO2 19* 20*  GLUCOSE 149* 125*  BUN 22 17  CREATININE 1.05 0.97  CALCIUM 8.3* 7.8*   GFR: Estimated Creatinine Clearance: 80.9 mL/min (by C-G formula based on SCr of 0.97 mg/dL). Recent Labs  Lab 01/29/21 1239 01/30/21 1456  WBC 7.9 8.2    Liver Function Tests: Recent Labs  Lab 01/29/21 1239  AST 22  ALT 28  ALKPHOS 42  BILITOT 0.8  PROT 6.3*  ALBUMIN 3.5   No results for input(s): LIPASE, AMYLASE in the last 168 hours. No results for input(s): AMMONIA in the last 168 hours.  ABG No results found for: PHART, PCO2ART, PO2ART, HCO3, TCO2, ACIDBASEDEF, O2SAT   Coagulation Profile: No results for input(s): INR, PROTIME in the last 168 hours.  Cardiac Enzymes: No results for input(s): CKTOTAL, CKMB, CKMBINDEX, TROPONINI in the last 168 hours.  HbA1C: No results found for: HGBA1C  CBG: Recent Labs  Lab 01/31/21 1235  GLUCAP 124*    Review of Systems:   No abdominal pain, fever, N/V. Comprehensive ROS otherwise negative  Past Medical History:  He,  has a past medical history of ALLERGIC RHINITIS (10/24/2007), HYPERLIPIDEMIA (10/16/2010), and HYPERTENSION (07/25/2007).   Surgical History:   Past Surgical History:  Procedure Laterality Date  . APPENDECTOMY    . CLAVICLE SURGERY    . NASAL SEPTUM SURGERY     septal deviation, turbinate hypertrophy and obstruction     Social History:   reports that he has never smoked. He has never used smokeless tobacco. He reports current alcohol use. He reports that he does not use drugs.   Family History:  His family history includes Diabetes in his brother; Hypertension in his brother.   Allergies Allergies  Allergen Reactions  . Doxycycline Other (See  Comments)    Headaches     Home Medications  Prior to Admission medications   Medication Sig Start Date End Date Taking? Authorizing Provider  aspirin EC 325 MG tablet Take 325 mg by mouth every other day.   Yes [provider]  benazepril (LOTENSIN) 20 MG tablet TAKE 1 TABLET BY MOUTH EVERY DAY Patient taking differently: Take 20 mg by mouth daily. 12/09/20  Yes Billie Ruddy, MD  Brimonidine Tartrate 0.025 % SOLN Place 1-2 drops into both eyes 3 (three) times daily as needed (to reduce redness/irritation).    Yes [provider]  Dextran 70-Hypromellose (ARTIFICIAL TEARS PF OP) Place 2 drops into both eyes every morning.   Yes [provider]  fexofenadine (ALLEGRA) 180 MG tablet Take 180 mg by mouth daily as needed for allergies or rhinitis.    Yes [provider]  spironolactone (ALDACTONE) 25 MG tablet TAKE 1 TABLET BY MOUTH EVERY DAY Patient taking differently: Take 25 mg  by mouth daily. 01/07/21  Yes Billie Ruddy, MD  VIAGRA 50 MG tablet TAKE 1 TABLET BY MOUTH AT BEDTIME AS NEEDED Patient taking differently: Take 50 mg by mouth daily as needed for erectile dysfunction. 08/10/16   Marletta Lor, MD     Critical care time:     CRITICAL CARE Performed by: Lanier Clam   Total critical care time: 33 minutes  Critical care time was exclusive of separately billable procedures and treating other patients.  Critical care was necessary to treat or prevent imminent or life-threatening deterioration.  Critical care was time spent personally by me on the following activities: development of treatment plan with patient and/or surrogate as well as nursing, discussions with consultants, evaluation of patient's response to treatment, examination of patient, obtaining history from patient or surrogate, ordering and performing treatments and interventions, ordering and review of laboratory studies, ordering and review of radiographic  studies, pulse oximetry and re-evaluation of patient's condition.

## 2021-02-01 NOTE — Anesthesia Postprocedure Evaluation (Signed)
Anesthesia Post Note  Patient: Jay Robertson  Procedure(s) Performed: ESOPHAGOGASTRODUODENOSCOPY (EGD) WITH PROPOFOL (N/A ) COLONOSCOPY WITH PROPOFOL (N/A ) BIOPSY     Patient location during evaluation: PACU Anesthesia Type: MAC Level of consciousness: awake and alert Pain management: pain level controlled Vital Signs Assessment: post-procedure vital signs reviewed and stable Respiratory status: spontaneous breathing Cardiovascular status: stable Anesthetic complications: no   No complications documented.  Last Vitals:  Vitals:   02/01/21 1330 02/01/21 1400  BP: 128/81 120/77  Pulse: 93 94  Resp: 16 13  Temp: 37.1 C   SpO2: 100% 100%    Last Pain:  Vitals:   02/01/21 1330  TempSrc: Oral  PainSc: 0-No pain                 Nolon Nations

## 2021-02-02 ENCOUNTER — Encounter (HOSPITAL_COMMUNITY): Payer: Self-pay | Admitting: Gastroenterology

## 2021-02-02 DIAGNOSIS — K922 Gastrointestinal hemorrhage, unspecified: Secondary | ICD-10-CM | POA: Diagnosis not present

## 2021-02-02 LAB — TYPE AND SCREEN
ABO/RH(D): O POS
Antibody Screen: NEGATIVE
Unit division: 0
Unit division: 0
Unit division: 0
Unit division: 0
Unit division: 0
Unit division: 0
Unit division: 0

## 2021-02-02 LAB — BPAM RBC
Blood Product Expiration Date: 202203022359
Blood Product Expiration Date: 202203082359
Blood Product Expiration Date: 202203082359
Blood Product Expiration Date: 202203082359
Blood Product Expiration Date: 202203082359
Blood Product Expiration Date: 202203082359
Blood Product Expiration Date: 202203082359
ISSUE DATE / TIME: 202202021726
ISSUE DATE / TIME: 202202040954
ISSUE DATE / TIME: 202202041752
ISSUE DATE / TIME: 202202042300
ISSUE DATE / TIME: 202202051034
ISSUE DATE / TIME: 202202051550
ISSUE DATE / TIME: 202202052046
Unit Type and Rh: 5100
Unit Type and Rh: 5100
Unit Type and Rh: 5100
Unit Type and Rh: 5100
Unit Type and Rh: 5100
Unit Type and Rh: 5100
Unit Type and Rh: 5100

## 2021-02-02 LAB — BASIC METABOLIC PANEL
Anion gap: 8 (ref 5–15)
BUN: 14 mg/dL (ref 8–23)
CO2: 21 mmol/L — ABNORMAL LOW (ref 22–32)
Calcium: 7.5 mg/dL — ABNORMAL LOW (ref 8.9–10.3)
Chloride: 109 mmol/L (ref 98–111)
Creatinine, Ser: 0.98 mg/dL (ref 0.61–1.24)
GFR, Estimated: >60 mL/min (ref >60)
Glucose, Bld: 84 mg/dL (ref 70–99)
Potassium: 3.8 mmol/L (ref 3.5–5.1)
Sodium: 138 mmol/L (ref 135–145)

## 2021-02-02 LAB — HEMOGLOBIN AND HEMATOCRIT, BLOOD
HCT: 20.9 % — ABNORMAL LOW (ref 39.0–52.0)
HCT: 23.5 % — ABNORMAL LOW (ref 39.0–52.0)
HCT: 24.1 % — ABNORMAL LOW (ref 39.0–52.0)
HCT: 27 % — ABNORMAL LOW (ref 39.0–52.0)
Hemoglobin: 7 g/dL — ABNORMAL LOW (ref 13.0–17.0)
Hemoglobin: 7.7 g/dL — ABNORMAL LOW (ref 13.0–17.0)
Hemoglobin: 8.4 g/dL — ABNORMAL LOW (ref 13.0–17.0)
Hemoglobin: 8.9 g/dL — ABNORMAL LOW (ref 13.0–17.0)

## 2021-02-02 NOTE — Progress Notes (Signed)
NAME:  Jay Robertson, MRN:  [id number], DOB:  1956/10/02, LOS: 3 ADMISSION DATE:  01/29/2021, CONSULTATION DATE:  02/02/21 REFERRING MD:  TRH CHIEF COMPLAINT:  hematochezia  Brief History:  65 year old admitted with bright red blood per rectum with intermittent hematochezia whom we are consulted for ongoing large-volume hematochezia.  History of Present Illness:  Patient mid 01/29/2021 after episode of syncope following bloody bowel movement.  Presumed orthostatic versus vasovagal.  Initial stool reportedly dark followed by bright red blood.  700 cc noted per EMS note.  To negative CTA angiograms for acute bleed.  The last couple days he he has had endoscopy and colonoscopy which showed no evidence of upper bleed, blood throughout the large intestine and bilateral diverticulosis.  He has intermittent hematochezia throughout the stay.  It comes and goes.  Seems to self resolved.  On the morning of 02/01/2021 he had several episodes of very large volume hematochezia.  This prompted PCCM consultation.  Overall he feels well.  Denies any GI distress.  Is concerned with ongoing bloody bowel movements.  Discussed care with hospitalist, GI, VIR.  Recommendations were to pursue tagged red blood cell scan given lack of localization on prior interventions.  Ideally would hold off until tomorrow but suspect he will need this done in the interim given mild reduction in blood pressure over the last several hours.  Past Medical History:  Hypertension  Significant Hospital Events:  CTA angio abdomen pelvis 2/2 was negative CTA angio abdomen pelvis 2/4 was negative Colonoscopy and upper endoscopy completed 02/01/2021 showed no evidence of bleed in the upper, no evidence of bleed with exam of distal ileum on colonoscopy, blood throughout the large  intestine with bilateral diverticulosis  Consults:  PCCM GI VIR  Procedures:  EGD and colonoscopy 02/01/2021  Significant Diagnostic Tests:  CT abdomen pelvis  angiogram 2/2, 2/4 both negative  Micro Data:  H. pylori pending from biopsy on endoscopy  Antimicrobials:  N/A  Interim History / Subjective:  No events, no BM in 12 hours. Tagged scan unrevealing. Wants to know what next step is. Denies N/V, abd pain.  Objective   Blood pressure 128/71, pulse 83, temperature 98 F (36.7 C), resp. rate (!) 21, height 5\' 11"  (1.803 m), weight 80.5 kg, SpO2 100 %.        Intake/Output Summary (Last 24 hours) at 02/02/2021 1133 Last data filed at 02/02/2021 0725 Gross per 24 hour  Intake 3230.23 ml  Output 1400 ml  Net 1830.23 ml   Filed Weights   01/29/21 1229 02/01/21 0235 02/02/21 0430  Weight: 79.8 kg 79.2 kg 80.5 kg    Examination: Constitutional: no acute distress  Eyes: EOMI, pupils equal Ears, nose, mouth, and throat: MMM, trachea midline Cardiovascular: RRR, ext warm Respiratory: clear, no wheezing Gastrointestinal: soft, +BS Skin: No rashes, normal turgor Neurologic: moves all 4 ext to command Psychiatric: RASS 0  Chemistries benign CBC stable  Resolved Hospital Problem list   n/a  Assessment & Plan:  GIB with hemorrhagic shock: source unclear, small nonbleeding ulcers on EGD, colon full of blood on colonoscopy but clear with lavage and no active bleeding seen.  Question small bowel vs. Diverticular bleed. - PPI - Trend H/H - Will touch base with GI regarding diet and ?need for capsule endoscopy vs. push enterescopy  Best practice (evaluated daily)  Diet: NPO Pain/Anxiety/Delirium protocol (if indicated): n/a VAP protocol (if indicated): n/a DVT prophylaxis: chemoprophylaxis contraindicaed in setting of GI bleed GI prophylaxis: PPI Glucose control:  n/a Mobility: bed rest for now Disposition: ICU  Goals of Care:  Last date of multidisciplinary goals of care discussion:n/a Family and staff present:  Summary of discussion:  Follow up goals of care discussion due: 2/11 Code Status: Full  Erskine Emery MD PCCM

## 2021-02-02 NOTE — Plan of Care (Signed)

## 2021-02-02 NOTE — Progress Notes (Signed)
[facility] Progress Note  CC:  GI bleed  Subjective:  No further bleeding since yesterday.  Feels good.  No new complaints.  Objective:  Vital signs in last 24 hours: Temp:  [98 F (36.7 C)-98.8 F (37.1 C)] 98 F (36.7 C) (02/06 1151) Pulse Rate:  [75-101] 83 (02/06 0700) Resp:  [13-25] 21 (02/06 0700) BP: (104-145)/(69-86) 128/71 (02/06 0700) SpO2:  [99 %-100 %] 100 % (02/06 0700) Weight:  [80.5 kg] 80.5 kg (02/06 0430) Last BM Date: 02/01/21 General:  Alert, Well-developed, in NAD Heart:  Regular rate and rhythm; no murmurs Pulm:  CTAB.  No W/R/R. Abdomen:  Soft, non-distended.  BS present.  Non-tender. Extremities:  Without edema. Neurologic:  Alert and oriented x 4;  grossly normal neurologically. Psych:  Alert and cooperative. Normal mood and affect.  Intake/Output from previous day: 02/05 0701 - 02/06 0700 In: 3830.2 [I.V.:816.5; Blood:1279.8; IV Piggyback:1733.9] Out: 1100 [Urine:300; Stool:800] Intake/Output this shift: Total I/O In: -  Out: 300 [Urine:300]  Lab Results: Recent Labs    01/30/21 1456 01/31/21 0528 02/01/21 1540 02/02/21 0017 02/02/21 0644  WBC 8.2  --   --   --   --   HGB 8.6*   < > 6.3* 8.9* 8.4*  HCT 23.9*   < > 19.4* 27.0* 24.1*  PLT 179  --   --   --   --    < > = values in this interval not displayed.   BMET Recent Labs    01/30/21 1456 02/02/21 0644  NA 137 138  K 3.9 3.8  CL 109 109  CO2 20* 21*  GLUCOSE 125* 84  BUN 17 14  CREATININE 0.97 0.98  CALCIUM 7.8* 7.5*   DG Chest 1 View  Result Date: 02/01/2021 CLINICAL DATA:  Central line placement. EXAM: CHEST  1 VIEW COMPARISON:  February 12, 2019. FINDINGS: The heart size and mediastinal contours are within normal limits. Both lungs are clear. No pneumothorax or pleural effusion is noted. 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 The visualized skeletal structures are unremarkable. IMPRESSION:  Venous sheath is seen in right internal jugular vein with distal tip in expected position of the SVC. Electronically Signed   By: Marijo Conception M.D.   On: 02/01/2021 15:28   NM GI Blood Loss  Result Date: 02/01/2021 CLINICAL DATA:  Episode of syncope following bloody bowel movement. EXAM: NUCLEAR MEDICINE GASTROINTESTINAL BLEEDING SCAN TECHNIQUE: Sequential abdominal images were obtained following intravenous administration of Tc-30m labeled red blood cells. RADIOPHARMACEUTICALS:  22 mCi Tc-78m pertechnetate in-vitro labeled red cells. COMPARISON:  CT abdomen pelvis January 31, 2021 and January 29, 2021. FINDINGS: Normal distribution radiotracer. No extraluminal activity to suggest the presence of active gastrointestinal bleed. IMPRESSION: No extraluminal radiotracer activity to suggest the presence of active gastrointestinal bleed. Electronically Signed   By: Dahlia Bailiff MD   On: 02/01/2021 20:44   CT Angio Abd/Pel w/ and/or w/o  Result Date: 01/31/2021 CLINICAL DATA:  Bright red GI bleeding EXAM: CTA ABDOMEN AND PELVIS WITHOUT AND WITH CONTRAST TECHNIQUE: Multidetector CT imaging of the abdomen and pelvis was performed using the standard protocol during bolus administration of intravenous contrast. Multiplanar reconstructed images and MIPs were obtained and reviewed to evaluate the vascular anatomy. CONTRAST:  189mL OMNIPAQUE IOHEXOL 350 MG/ML SOLN COMPARISON:  CTA abdomen and pelvis dated 01/29/2021 FINDINGS: VASCULAR Aorta: Normal caliber aorta without aneurysm, dissection, vasculitis or significant  stenosis. Trace atherosclerotic calcifications along the abdominal aorta. Celiac: Patent without evidence of aneurysm, dissection, vasculitis or significant stenosis. The left hepatic artery is replaced to the left gastric artery. SMA: Patent without evidence of aneurysm, dissection, vasculitis or significant stenosis. Renals: Both renal arteries are patent without evidence of aneurysm, dissection,  vasculitis, fibromuscular dysplasia or significant stenosis. IMA: Patent without evidence of aneurysm, dissection, vasculitis or significant stenosis. Inflow: Patent without evidence of aneurysm, dissection, vasculitis or significant stenosis. Proximal Outflow: Bilateral common femoral and visualized portions of the superficial and profunda femoral arteries are patent without evidence of aneurysm, dissection, vasculitis or significant stenosis. Veins: No focal venous abnormality. Review of the MIP images confirms the above findings. NON-VASCULAR Lower chest: No acute abnormality. Hepatobiliary: No focal liver abnormality is seen. No gallstones, gallbladder wall thickening, or biliary dilatation. High attenuation material within the gallbladder lumen favored to reflect vicarious excretion of contrast material. Pancreas: Unremarkable. No pancreatic ductal dilatation or surrounding inflammatory changes. Spleen: Normal in size without focal abnormality. Adrenals/Urinary Tract: Adrenal glands are unremarkable. Kidneys are normal, without renal calculi, focal l solid lesion, or hydronephrosis. Bladder is unremarkable. Small simple cyst exophytic from the lower pole of the left kidney. Stomach/Bowel: Stomach is within normal limits. Appendix appears normal. No evidence of bowel wall thickening, distention, or inflammatory changes. There are a few scattered colonic diverticula but no evidence of active diverticulitis. Lymphatic: No suspicious lymphadenopathy. Reproductive: Prostate is unremarkable. Other: No abdominal wall hernia or abnormality. No abdominopelvic ascites. Musculoskeletal: No acute or significant osseous findings. Focal L5-S1 degenerative disc disease. IMPRESSION: 1. No evidence of active GI bleed at this time. 2. Mild colonic diverticulosis without evidence of diverticulitis. Electronically Signed   By: Jacqulynn Cadet M.D.   On: 01/31/2021 15:09   Assessment / Plan: *   Painless hematochezia. CTAP  with angiography 2/2 shows: No active bleeding.  Diverticulosis.  Diarrheal state.  Segmental thickening in proximal transverse colon, possibly due to underdistention but mass not excluded.  EGD 2/5: - No gross lesions in esophagus. Z-line regular, 41 cm from the incisors. - 1 cm hiatal hernia. - Gastritis. Non-bleeding gastric ulcers with a clean ulcer base (Forrest Class III). No other gross lesions in the stomach. Biopsied. - Erythematous duodenopathy in bulb. No other gross lesions in the duodenal bulb, in the first portion of the duodenum and in the second portion of the duodenum. Biopsied.  Colonoscopy 2/5: - Hemorrhoids found on digital rectal exam. - The examined portion of the ileum was normal - no bleeding noted coming from above. - One 8 mm polyp at the hepatic flexure. Resection not attempted. - Diverticulosis in the recto-sigmoid colon, in the sigmoid colon, in the descending colon, at the hepatic flexure and in the ascending colon. - Blood in the entire examined colon - lavaged and suctioned with adequate visualization. - Non-bleeding non-thrombosed external and internal hemorrhoids. - I evaluated the colon on 4 separate pull-throughs from TI to rectum in effort of localizing any active bleeding, and nothing was found.  Had significant bleeding after returning to his room from procedures.    Bleeding scan negative.    Bleeding very likely to be diverticular.  *    Blood loss anemia.  Hgb  11.8 >> 1 PRBC >>  8.6 >>  6.7 >> 1 PRBC.  Hgb dropped to 6.3 again yesterday, 2/5, so he was given 3 more units PRBCs and it increased to 8.9 grams early this AM then 8.4 grams today.  ? 5  total this hospitalization.  -Will plan for VCE tomorrow to be sure small bowel source. -Monitor Hgb and transfuse further prn.    LOS: 3 days   Jay Robertson. Jay Robertson  02/02/2021, 12:04 PM

## 2021-02-03 ENCOUNTER — Encounter (HOSPITAL_COMMUNITY)
Admission: EM | Disposition: A | Discharge status: Home / Self Care | Payer: Self-pay | Source: Home / Self Care | Attending: Internal Medicine

## 2021-02-03 ENCOUNTER — Inpatient Hospital Stay (HOSPITAL_COMMUNITY): Payer: Medicare Other

## 2021-02-03 DIAGNOSIS — D62 Acute posthemorrhagic anemia: Secondary | ICD-10-CM | POA: Diagnosis not present

## 2021-02-03 DIAGNOSIS — K922 Gastrointestinal hemorrhage, unspecified: Secondary | ICD-10-CM | POA: Diagnosis not present

## 2021-02-03 DIAGNOSIS — K5731 Diverticulosis of large intestine without perforation or abscess with bleeding: Secondary | ICD-10-CM

## 2021-02-03 HISTORY — PX: GIVENS CAPSULE STUDY: SHX5432

## 2021-02-03 LAB — BASIC METABOLIC PANEL
Anion gap: 7 (ref 5–15)
BUN: 11 mg/dL (ref 8–23)
CO2: 23 mmol/L (ref 22–32)
Calcium: 7.2 mg/dL — ABNORMAL LOW (ref 8.9–10.3)
Chloride: 107 mmol/L (ref 98–111)
Creatinine, Ser: 0.8 mg/dL (ref 0.61–1.24)
GFR, Estimated: >60 mL/min (ref >60)
Glucose, Bld: 83 mg/dL (ref 70–99)
Potassium: 3.7 mmol/L (ref 3.5–5.1)
Sodium: 137 mmol/L (ref 135–145)

## 2021-02-03 LAB — CBC
HCT: 19.2 % — ABNORMAL LOW (ref 39.0–52.0)
Hemoglobin: 6.5 g/dL — CL (ref 13.0–17.0)
MCH: 30.4 pg (ref 26.0–34.0)
MCHC: 33.9 g/dL (ref 30.0–36.0)
MCV: 89.7 fL (ref 80.0–100.0)
Platelets: 127 10*3/uL — ABNORMAL LOW (ref 150–400)
RBC: 2.14 MIL/uL — ABNORMAL LOW (ref 4.22–5.81)
RDW: 14.9 % (ref 11.5–15.5)
WBC: 8.8 10*3/uL (ref 4.0–10.5)
nRBC: 0.5 % — ABNORMAL HIGH (ref 0.0–0.2)

## 2021-02-03 LAB — HEMOGLOBIN AND HEMATOCRIT, BLOOD
HCT: 18.8 % — ABNORMAL LOW (ref 39.0–52.0)
HCT: 24.2 % — ABNORMAL LOW (ref 39.0–52.0)
HCT: 22.6 % — ABNORMAL LOW (ref 39.0–52.0)
Hemoglobin: 6.2 g/dL — CL (ref 13.0–17.0)
Hemoglobin: 7.8 g/dL — ABNORMAL LOW (ref 13.0–17.0)
Hemoglobin: 7.4 g/dL — ABNORMAL LOW (ref 13.0–17.0)

## 2021-02-03 LAB — PREPARE RBC (CROSSMATCH)

## 2021-02-03 SURGERY — IMAGING PROCEDURE, GI TRACT, INTRALUMINAL, VIA CAPSULE
Anesthesia: LOCAL

## 2021-02-03 MED ORDER — SODIUM CHLORIDE 0.9% IV SOLUTION
Freq: Once | INTRAVENOUS | Status: AC
  Administered 2021-02-03: 500 mL via INTRAVENOUS

## 2021-02-03 MED ORDER — CEPHALEXIN 250 MG PO CAPS
500.0000 mg | ORAL_CAPSULE | Freq: Three times a day (TID) | ORAL | Status: DC
  Administered 2021-02-03 – 2021-02-04 (×3): 500 mg via ORAL
  Filled 2021-02-03 (×3): qty 1
  Filled 2021-02-03: qty 2

## 2021-02-03 SURGICAL SUPPLY — 1 items: TOWEL COTTON PACK 4EA (MISCELLANEOUS) ×4 IMPLANT

## 2021-02-03 NOTE — Progress Notes (Incomplete)
NAME:  Jay Robertson, MRN:  [id number], DOB:  04-12-1956, LOS: 4 ADMISSION DATE:  01/29/2021, CONSULTATION DATE:  *** REFERRING MD:  ***, CHIEF COMPLAINT:  ***   Brief History   65 year old admitted with bright red blood per rectum with intermittent hematochezia whom we are consulted for ongoing large-volume hematochezia.  Patient presented on 01/29/2021 after episode of syncope following bloody bowel movement.  Presumed orthostatic versus vasovagal.  Initial stool reportedly dark followed by bright red blood.  700 cc noted per EMS note.  To negative CTA angiograms for acute bleed.  The last couple days he he has had endoscopy and colonoscopy which showed no evidence of upper bleed, blood throughout the large intestine and bilateral diverticulosis.  He has intermittent hematochezia throughout the stay.  It comes and goes.  Seems to self resolved.  On the morning of 02/01/2021 he had several episodes of very large volume hematochezia.  This prompted PCCM consultation.  Overall he feels well.  Denies any GI distress.  Is concerned with ongoing bloody bowel movements.  Discussed care with hospitalist, GI, VIR.  Recommendations were to pursue tagged red blood cell scan given lack of localization on prior interventions.  Ideally would hold off until tomorrow but suspect he will need this done in the interim given mild reduction in blood pressure over the last several hours.  Past Medical History  HTN HLD  Significant Hospital Events   CTA angio abdomen pelvis 2/2 was negative CTA angio abdomen pelvis 2/4 was negative Colonoscopy and upper endoscopy completed 02/01/2021 showed no evidence of bleed in the upper, no evidence of bleed with exam of distal ileum on colonoscopy, blood throughout the large  intestine with bilateral diverticulosis  Consults:  PCCM GI VIR  Procedures:  EGD and colonoscopy 02/01/2021  Significant Diagnostic Tests:  CT abdomen pelvis angiogram 2/2, 2/4 both  negative  Colonoscopy 2/5: - Hemorrhoids found on digital rectal exam. - The examined portion of the ileum was normal - no bleeding noted coming from above. - One 8 mm polyp at the hepatic flexure. Resection not attempted. - Diverticulosis in the recto-sigmoid colon, in the sigmoid colon, in the descending colon, at the hepatic flexure and in the ascending colon. - Blood in the entire examined colon - lavaged and suctioned with adequate visualization. - Non-bleeding non-thrombosed external and internal hemorrhoids. - I evaluated the colon on 4 separate pull-throughs from TI to rectum in effort of localizing any active bleeding, and nothing was found.  Micro Data:  H. pylori pending from biopsy on endoscopy COVID - negative     Antimicrobials:  N/A   Interim history/subjective:  No events, no BM in 12 hours. Tagged scan unrevealing. Wants to know what next step is. Denies N/V, abd pain.  No fever overnight, normotensive, saturating well on RA. 4.3 L out overnight. Plan for video endoscopy.   ***  Objective   Blood pressure 112/66, pulse 78, temperature 98 F (36.7 C), temperature source Oral, resp. rate 10, height 5\' 11"  (1.803 m), weight 79.4 kg, SpO2 100 %.        Intake/Output Summary (Last 24 hours) at 02/03/2021 0543 Last data filed at 02/02/2021 2200 Gross per 24 hour  Intake 720 ml  Output 1700 ml  Net -980 ml   Filed Weights   02/01/21 0235 02/02/21 0430 02/03/21 0500  Weight: 79.2 kg 80.5 kg 79.4 kg    Examination: Constitutional: no acute distress  Eyes: EOMI, pupils equal Ears, nose, mouth, and throat:  MMM, trachea midline Cardiovascular: RRR, ext warm Respiratory: clear, no wheezing Gastrointestinal: soft, +BS Skin: No rashes, normal turgor Neurologic: moves all 4 ext to command Psychiatric: RASS 0  Chemistries benign CBC stable  Resolved Hospital Problem list   ***  Assessment & Plan:   [name] is a 65 y.o. with a pertinent PMH of ***,  who presented with *** and admitted for ***.   GIB with hemorrhagic shock: source unclear, small nonbleeding ulcers on EGD, colon full of blood on colonoscopy but clear with lavage and no active bleeding seen.  Question small bowel vs. Diverticular bleed. - PPI - Trend H/H - Will touch base with GI regarding diet and ?need for capsule endoscopy vs. push enterescopy  Patient is s/p 5? units PRBCs since being transferred to [facility] yesterday. Will get CBC, PT/aPTT and fibrinogen today.  Best Practice:  Diet: NPO Pain/Anxiety/Delirium protocol (if indicated): n/a VAP protocol (if indicated): n/a DVT prophylaxis: chemoprophylaxis contraindicaed in setting of GI bleed GI prophylaxis: PPI Glucose control: n/a Mobility: bed rest for now Disposition: ICU  Goals of Care:  Last date of multidisciplinary goals of care discussion:n/a Family and staff present:  Summary of discussion:  Follow up goals of care discussion due: 2/11 Code Status: Full  Labs   CBC: Recent Labs  Lab 01/29/21 1239 01/29/21 2225 01/30/21 1456 01/31/21 0528 02/01/21 1540 02/02/21 0017 02/02/21 0644 02/02/21 1347 02/02/21 2109  WBC 7.9  --  8.2  --   --   --   --   --   --   HGB 11.8*   < > 8.6*   < > 6.3* 8.9* 8.4* 7.7* 7.0*  HCT 33.6*   < > 23.9*   < > 19.4* 27.0* 24.1* 23.5* 20.9*  MCV 94.9  --  90.2  --   --   --   --   --   --   PLT 219  --  179  --   --   --   --   --   --    < > = values in this interval not displayed.    Basic Metabolic Panel: Recent Labs  Lab 01/29/21 1239 01/30/21 1456 02/02/21 0644  NA 135 137 138  K 4.2 3.9 3.8  CL 104 109 109  CO2 19* 20* 21*  GLUCOSE 149* 125* 84  BUN 22 17 14   CREATININE 1.05 0.97 0.98  CALCIUM 8.3* 7.8* 7.5*   GFR: Estimated Creatinine Clearance: 80 mL/min (by C-G formula based on SCr of 0.98 mg/dL). Recent Labs  Lab 01/29/21 1239 01/30/21 1456  WBC 7.9 8.2    Liver Function Tests: Recent Labs  Lab 01/29/21 1239  AST 22  ALT 28   ALKPHOS 42  BILITOT 0.8  PROT 6.3*  ALBUMIN 3.5   No results for input(s): LIPASE, AMYLASE in the last 168 hours. No results for input(s): AMMONIA in the last 168 hours.  ABG No results found for: PHART, PCO2ART, PO2ART, HCO3, TCO2, ACIDBASEDEF, O2SAT   Coagulation Profile: No results for input(s): INR, PROTIME in the last 168 hours.  Cardiac Enzymes: No results for input(s): CKTOTAL, CKMB, CKMBINDEX, TROPONINI in the last 168 hours.  HbA1C: No results found for: HGBA1C  CBG: Recent Labs  Lab 01/31/21 1235  GLUCAP 124*    Review of Systems:   ROS  Past Medical History  He,  has a past medical history of ALLERGIC RHINITIS (10/24/2007), HYPERLIPIDEMIA (10/16/2010), and HYPERTENSION (07/25/2007).   Surgical History  Past Surgical History:  Procedure Laterality Date  . APPENDECTOMY    . BIOPSY  02/01/2021   Procedure: BIOPSY;  Surgeon: Rush Landmark Telford Nab., MD;  Location: [place];  Service: Gastroenterology;;  . CLAVICLE SURGERY    . COLONOSCOPY WITH PROPOFOL N/A 02/01/2021   Procedure: COLONOSCOPY WITH PROPOFOL;  Surgeon: Rush Landmark Telford Nab., MD;  Location: [place];  Service: Gastroenterology;  Laterality: N/A;  . ESOPHAGOGASTRODUODENOSCOPY (EGD) WITH PROPOFOL N/A 02/01/2021   Procedure: ESOPHAGOGASTRODUODENOSCOPY (EGD) WITH PROPOFOL;  Surgeon: Rush Landmark Telford Nab., MD;  Location: [place];  Service: Gastroenterology;  Laterality: N/A;  . NASAL SEPTUM SURGERY     septal deviation, turbinate hypertrophy and obstruction     Social History   reports that he has never smoked. He has never used smokeless tobacco. He reports current alcohol use. He reports that he does not use drugs.   Family History   His family history includes Diabetes in his brother; Hypertension in his brother.   Allergies Allergies  Allergen Reactions  . Doxycycline Other (See Comments)    Headaches     Home Medications  Prior to Admission medications   Medication Sig  Start Date End Date Taking? Authorizing Provider  aspirin EC 325 MG tablet Take 325 mg by mouth every other day.   Yes [provider]  benazepril (LOTENSIN) 20 MG tablet TAKE 1 TABLET BY MOUTH EVERY DAY Patient taking differently: Take 20 mg by mouth daily. 12/09/20  Yes Billie Ruddy, MD  Brimonidine Tartrate 0.025 % SOLN Place 1-2 drops into both eyes 3 (three) times daily as needed (to reduce redness/irritation).    Yes [provider]  Dextran 70-Hypromellose (ARTIFICIAL TEARS PF OP) Place 2 drops into both eyes every morning.   Yes [provider]  fexofenadine (ALLEGRA) 180 MG tablet Take 180 mg by mouth daily as needed for allergies or rhinitis.    Yes [provider]  spironolactone (ALDACTONE) 25 MG tablet TAKE 1 TABLET BY MOUTH EVERY DAY Patient taking differently: Take 25 mg by mouth daily. 01/07/21  Yes Billie Ruddy, MD  VIAGRA 50 MG tablet TAKE 1 TABLET BY MOUTH AT BEDTIME AS NEEDED Patient taking differently: Take 50 mg by mouth daily as needed for erectile dysfunction. 08/10/16   Marletta Lor, MD     Critical care time: ***     Lawerance Cruel, D.O.  Internal Medicine Resident, PGY-2 [facility]  Pager: [phone] 5:43 AM, 02/03/2021

## 2021-02-03 NOTE — Progress Notes (Signed)
   NAME:  CHRISOTPHER RIVERO, MRN:  [id number], DOB:  07-17-1956, LOS: 4 ADMISSION DATE:  01/29/2021, CONSULTATION DATE:  02/03/21 REFERRING MD:  TRH CHIEF COMPLAINT:  hematochezia  Brief History:  65 year old admitted with bright red blood per rectum with intermittent hematochezia whom we are consulted for ongoing large-volume hematochezia.   Past Medical History:  Hypertension  Significant Hospital Events:  CTA angio abdomen pelvis 2/2 was negative CTA angio abdomen pelvis 2/4 was negative Colonoscopy and upper endoscopy completed 02/01/2021 showed no evidence of bleed in the upper, no evidence of bleed with exam of distal ileum on colonoscopy, blood throughout the large  intestine with bilateral diverticulosis 2/6Tagged scan unrevealing. Consults:  PCCM GI VIR  Procedures:  EGD and colonoscopy 02/01/2021  Significant Diagnostic Tests:  CT abdomen pelvis angiogram 2/2, 2/4 both negative  Micro Data:  H. pylori pending from biopsy on endoscopy  Antimicrobials:  N/A  Interim History / Subjective:  No distress.  Feels frustrated as source of bleeding not definitively identified.  No pain.  Frequency of bloody bowel movements have decreased  Objective   Blood pressure 136/80, pulse 89, temperature 98.1 F (36.7 C), temperature source Oral, resp. rate 13, height 5\' 11"  (1.803 m), weight 79.4 kg, SpO2 100 %.        Intake/Output Summary (Last 24 hours) at 02/03/2021 0844 Last data filed at 02/02/2021 2200 Gross per 24 hour  Intake 720 ml  Output 1400 ml  Net -680 ml   Filed Weights   02/01/21 0235 02/02/21 0430 02/03/21 0500  Weight: 79.2 kg 80.5 kg 79.4 kg    Examination:  General 65 year old male patient resting in bed he is in no acute distress HEENT normocephalic atraumatic no jugular venous distention mucous membranes moist Pulmonary: Clear to auscultation and without accessory use, currently on room air Cardiac: Regular rate and rhythm but he is tachycardic with heart rate  none low 100s no murmur rub or gallop appreciated Abdomen: Soft not tender no organomegaly Extremities: Warm dry with brisk capillary refill Neuro: Awake oriented and with no focal deficits appreciated.  Resolved Hospital Problem list   n/a  Assessment & Plan:  GIB with hemorrhagic shock: GI thinks stuttering Diverticular bleed is etiology of bleeding.  Plan Video endoscopy today Transfuse for hgb < 7 or evidence of active bleeding, waiting transfusion for this morning Cont PPI  If not orthostatic and stable over the early afternoon can move to progressive care later today   Best practice (evaluated daily)  Diet: NPO Pain/Anxiety/Delirium protocol (if indicated): n/a VAP protocol (if indicated): n/a DVT prophylaxis: chemoprophylaxis contraindicaed in setting of GI bleed GI prophylaxis: PPI Glucose control: n/a Mobility: bed rest for now Disposition: ICU  Goals of Care:  Last date of multidisciplinary goals of care discussion:n/a Family and staff present:  Summary of discussion:  Follow up goals of care discussion due: 2/11 Code Status: Full  Erick Colace ACNP-BC [place] Pager # [phone] OR # [phone] if no answer

## 2021-02-03 NOTE — Progress Notes (Signed)
Progress Note   Subjective  Patient had some recurrent smaller volume red blood per rectum overnight. Last episode 7 AM. He is in no pain. Hgb has drifted back to 6s, getting RBC transfusion again. On clear liquid diet. Capsule endoscopy placed   Objective   Vital signs in last 24 hours: Temp:  [97.9 F (36.6 C)-98.7 F (37.1 C)] 98.1 F (36.7 C) (02/07 0716) Pulse Rate:  [71-110] 93 (02/07 0900) Resp:  [0-20] 12 (02/07 0900) BP: (100-152)/(55-87) 130/85 (02/07 0900) SpO2:  [98 %-100 %] 100 % (02/07 0900) Weight:  [79.4 kg] 79.4 kg (02/07 0857) Last BM Date: 02/02/21 General:    AA male in NAD Abdomen:  Soft, nontender and nondistended.  Extremities:  Without edema. Neurologic:  Alert and oriented,  grossly normal neurologically. Psych:  Cooperative. Normal mood and affect.  Intake/Output from previous day: 02/06 0701 - 02/07 0700 In: 720 [P.O.:720] Out: 1700 [Urine:1700] Intake/Output this shift: No intake/output data recorded.  Lab Results: Recent Labs    02/02/21 2109 02/03/21 0503 02/03/21 0613  WBC  --   --  8.8  HGB 7.0* 6.2* 6.5*  HCT 20.9* 18.8* 19.2*  PLT  --   --  127*   BMET Recent Labs    02/02/21 0644 02/03/21 0613  NA 138 137  K 3.8 3.7  CL 109 107  CO2 21* 23  GLUCOSE 84 83  BUN 14 11  CREATININE 0.98 0.80  CALCIUM 7.5* 7.2*   LFT No results for input(s): PROT, ALBUMIN, AST, ALT, ALKPHOS, BILITOT, BILIDIR, IBILI in the last 72 hours. PT/INR No results for input(s): LABPROT, INR in the last 72 hours.  Studies/Results: DG Chest 1 View  Result Date: 02/01/2021 CLINICAL DATA:  Central line placement. EXAM: CHEST  1 VIEW COMPARISON:  February 12, 2019. FINDINGS: The heart size and mediastinal contours are within normal limits. Both lungs are clear. No pneumothorax or pleural effusion is noted. 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 The visualized skeletal structures are  unremarkable. IMPRESSION: Venous sheath is seen in right internal jugular vein with distal tip in expected position of the SVC. Electronically Signed   By: Marijo Conception M.D.   On: 02/01/2021 15:28   NM GI Blood Loss  Result Date: 02/01/2021 CLINICAL DATA:  Episode of syncope following bloody bowel movement. EXAM: NUCLEAR MEDICINE GASTROINTESTINAL BLEEDING SCAN TECHNIQUE: Sequential abdominal images were obtained following intravenous administration of Tc-14m labeled red blood cells. RADIOPHARMACEUTICALS:  22 mCi Tc-74m pertechnetate in-vitro labeled red cells. COMPARISON:  CT abdomen pelvis January 31, 2021 and January 29, 2021. FINDINGS: Normal distribution radiotracer. No extraluminal activity to suggest the presence of active gastrointestinal bleed. IMPRESSION: No extraluminal radiotracer activity to suggest the presence of active gastrointestinal bleed. Electronically Signed   By: Dahlia Bailiff MD   On: 02/01/2021 20:44       Assessment / Plan:    65 y/o male admitted with GI bleeding. His workup thus far has included EGD and colonoscopy, CTA x 2, and tagged RBC scan. Suspected dx based on his workup is that he has stuttering diverticular bleeding, although unclear if this is right or left sided given he has diverticulosis in both areas of his colon. He had some mild bleeding overnight, Hgb has trended down, needing more blood today. Capsule endoscopy placed this AM to ensure no evidence of small bowel source. We will have that result tomorrow  AM. In the interim, should he had significant bleeding again, would consider repeated tagged RBC scan or CTA to see if we can localize source. I have discussed diverticular bleeding with him, hopefully this stops with more time, I counseled him if not, IR would be best option if we can localize the site, with surgery being a last resort. He is frustrated by this course so far which is understandable. Please let me know if he has significant bleeding today and  will coordinate additional imaging. If not, continue clear liqudis, await response to RBC transfusion, will have capsule study back tomorrow.  Dubach Cellar, MD [facility]

## 2021-02-03 NOTE — Progress Notes (Signed)
Pt ingested [facility] Capsule at 9:00am.  Pt NPO since midnight.  No issues with swallowing capsule.  Instructions reviewed with patient.  Pt verbalized understanding.  Reviewed instructions with RN at bedside.  Endo RN to pick up capsule recorder on 02/04/21.

## 2021-02-03 NOTE — Progress Notes (Signed)
[place] Progress Note Patient Name: DRAVIN LANCE DOB: 1956/12/04 MRN: [id number]   Date of Service  02/03/2021  HPI/Events of Note  Hemoglobin 6.5  eICU Interventions  1 unit RBC transfusion, RN to take consent     Intervention Category Major Interventions: Hemorrhage - evaluation and management  Margaretmary Lombard 02/03/2021, 6:48 AM

## 2021-02-03 NOTE — Progress Notes (Signed)
   02/03/21 1100  Clinical Encounter Type  Visited With Patient  Visit Type Initial  Referral From Nurse  Consult/Referral To Chaplain  Spiritual Encounters  Spiritual Needs Literature  The chaplain responded to spiritual care consult for an advanced directive. The chaplain provided AD education. The patient and his girlfriend was present. The POA will more than likely be the sister based on the conversations; however, once they review the paperwork they will make the determination if the want to proceed with an advanced directive. The chaplain informed the patient that a notary is not in the building until Friday and no guarantees to complete the POA will be given. The chaplain did inform the patient he can use an outside notary if he gets out before Friday. The chaplain prayed with the patient and his girlfriend Glenard Haring) per their request. The patient repeated the sinners prayer and received the salvation of Christ. The chaplain will follow up as needed.

## 2021-02-04 ENCOUNTER — Inpatient Hospital Stay (HOSPITAL_COMMUNITY): Payer: Medicare Other

## 2021-02-04 DIAGNOSIS — K625 Hemorrhage of anus and rectum: Secondary | ICD-10-CM | POA: Diagnosis not present

## 2021-02-04 DIAGNOSIS — I809 Phlebitis and thrombophlebitis of unspecified site: Secondary | ICD-10-CM

## 2021-02-04 DIAGNOSIS — R011 Cardiac murmur, unspecified: Secondary | ICD-10-CM | POA: Diagnosis not present

## 2021-02-04 DIAGNOSIS — D62 Acute posthemorrhagic anemia: Secondary | ICD-10-CM | POA: Diagnosis not present

## 2021-02-04 DIAGNOSIS — K922 Gastrointestinal hemorrhage, unspecified: Secondary | ICD-10-CM | POA: Diagnosis not present

## 2021-02-04 LAB — BASIC METABOLIC PANEL
Anion gap: 9 (ref 5–15)
BUN: 12 mg/dL (ref 8–23)
CO2: 19 mmol/L — ABNORMAL LOW (ref 22–32)
Calcium: 7.7 mg/dL — ABNORMAL LOW (ref 8.9–10.3)
Chloride: 107 mmol/L (ref 98–111)
Creatinine, Ser: 0.91 mg/dL (ref 0.61–1.24)
GFR, Estimated: >60 mL/min (ref >60)
Glucose, Bld: 115 mg/dL — ABNORMAL HIGH (ref 70–99)
Potassium: 3.6 mmol/L (ref 3.5–5.1)
Sodium: 135 mmol/L (ref 135–145)

## 2021-02-04 LAB — ECHOCARDIOGRAM COMPLETE
Area-P 1/2: 3.99 cm2
Height: 71 in
S' Lateral: 2.4 cm
Weight: 2800.72 oz

## 2021-02-04 LAB — PROTIME-INR
INR: 1.3 — ABNORMAL HIGH (ref 0.8–1.2)
Prothrombin Time: 16.1 seconds — ABNORMAL HIGH (ref 11.4–15.2)

## 2021-02-04 LAB — FIBRINOGEN: Fibrinogen: 468 mg/dL (ref 210–475)

## 2021-02-04 LAB — HEMOGLOBIN AND HEMATOCRIT, BLOOD
HCT: 22 % — ABNORMAL LOW (ref 39.0–52.0)
HCT: 22.5 % — ABNORMAL LOW (ref 39.0–52.0)
HCT: 20.7 % — ABNORMAL LOW (ref 39.0–52.0)
Hemoglobin: 7.2 g/dL — ABNORMAL LOW (ref 13.0–17.0)
Hemoglobin: 7.7 g/dL — ABNORMAL LOW (ref 13.0–17.0)
Hemoglobin: 6.9 g/dL — CL (ref 13.0–17.0)

## 2021-02-04 LAB — PREPARE RBC (CROSSMATCH)

## 2021-02-04 LAB — GLUCOSE, CAPILLARY: Glucose-Capillary: 96 mg/dL (ref 70–99)

## 2021-02-04 LAB — APTT: aPTT: 33 seconds (ref 24–36)

## 2021-02-04 MED ORDER — VANCOMYCIN HCL 1250 MG/250ML IV SOLN
1250.0000 mg | Freq: Two times a day (BID) | INTRAVENOUS | Status: DC
  Administered 2021-02-05 – 2021-02-11 (×13): 1250 mg via INTRAVENOUS
  Filled 2021-02-04 (×15): qty 250

## 2021-02-04 MED ORDER — VANCOMYCIN HCL 1500 MG/300ML IV SOLN
1500.0000 mg | Freq: Once | INTRAVENOUS | Status: AC
  Administered 2021-02-04: 1500 mg via INTRAVENOUS
  Filled 2021-02-04: qty 300

## 2021-02-04 MED ORDER — CALCIUM GLUCONATE-NACL 2-0.675 GM/100ML-% IV SOLN
2.0000 g | Freq: Once | INTRAVENOUS | Status: AC
  Administered 2021-02-04: 2000 mg via INTRAVENOUS
  Filled 2021-02-04: qty 100

## 2021-02-04 MED ORDER — SODIUM CHLORIDE 0.9% IV SOLUTION: Freq: Once | INTRAVENOUS | Status: AC

## 2021-02-04 NOTE — Progress Notes (Signed)
Pharmacy Antibiotic Note  Jay Robertson is a 65 y.o. male admitted on 01/29/2021 with catheter associated thrombophlebitis.  Pharmacy has been consulted for vancomycin dosing.  Was started on cephalexin. Afebrile, WBC 8.8. Scr 0.8 (CrCl 98 mL/min). Was found to have superficial cephalic venus thrombus consistent with catheter associated thrombophlebitis - ID following and wanted to start vancomycin.   Plan: Order vancomycin 1500 mg IV once then 1250 mg IV every 12 hours (estAUC 509) Monitor renal fx, cx results, clinical pic, and ID plans   Height: 5\' 11"  (180.3 cm) Weight: 79.4 kg (175 lb 0.7 oz) IBW/kg (Calculated) : 75.3  Temp (24hrs), Avg:98.4 F (36.9 C), Min:98.3 F (36.8 C), Max:98.6 F (37 C)  Recent Labs  Lab 01/29/21 1239 01/30/21 1456 02/02/21 0644 02/03/21 0613  WBC 7.9 8.2  --  8.8  CREATININE 1.05 0.97 0.98 0.80    Estimated Creatinine Clearance: 98 mL/min (by C-G formula based on SCr of 0.8 mg/dL).    Allergies  Allergen Reactions  . Doxycycline Other (See Comments)    Headaches    Antimicrobials this admission: Cephalexin 2/7 >> 2/8 Vancomycin 2/8 >>   Dose adjustments this admission: N/A  Microbiology results: 2/8 BCx: sent 2/2 COVID PCR: neg  Thank you for allowing pharmacy to be a part of this patient's care.  Antonietta Jewel, PharmD, [place] Clinical Pharmacist  Phone: [phone] 02/04/2021 4:51 PM  Please check AMION for all [facility] phone numbers After 10:00 PM, call [facility] [phone]

## 2021-02-04 NOTE — Progress Notes (Signed)
  Echocardiogram 2D Echocardiogram has been performed.  Jay Robertson 02/04/2021, 4:01 PM

## 2021-02-04 NOTE — Consult Note (Signed)
[facility]    Date of Admission:  01/29/2021   Total days of antibiotics 3        Day 2 vancomycin               Reason for Consult: Thrombophlebitis    Referring Provider: Dr. Noemi Chapel Primary Care Provider: Dr. Grier Mitts  Assessment: Thrombophlebitis LGIB   Plan: 1. Thrombophlebitis- left upper extremity US showed a superficial cephalic venous thrombus which is partially occlusive extending from the antecubital fossa to the mid upper arm, from previous IV line. Has a left forearm peripheral IV and a RIJ. Started on keflex initially. On exam, mild erythema at site and firm to touch; does not appear infected. Given this is catheter associated will treat with IV vancomycin while in the hospital and likely transition to oral linezolid at discharge for ~1 week with follow up outpatient. 2. LGIB- likely right sided colonic diverticular bleed. GI on board.   Principal Problem:   Acute lower GI bleeding Active Problems:   Essential hypertension   Syncope   GI bleed   Diverticulosis of colon with hemorrhage   Scheduled Meds: . Chlorhexidine Gluconate Cloth  6 each Topical Daily  . pantoprazole  40 mg Oral BID AC   Continuous Infusions: . sodium chloride    . vancomycin 1,250 mg (02/05/21 0833)   PRN Meds:.acetaminophen **OR** acetaminophen, ALPRAZolam, ondansetron **OR** ondansetron (ZOFRAN) IV  HPI: Jay Robertson is a 65 y.o. male with hypertension and hyperlipidemia who presented on 2/2 for syncope following a bloody bowel movement. GI were consulted and offered colonoscopy for evaluation of painless hematochezia however patient refused.  CTA abdomen/pelvis did not show any definite evidence of active GI bleed.  Possible mild colitis changes seen without evidence of bowel obstruction.  Colonic diverticulosis noted. Thickening of the proximal transverse colon noted which may be related to underdistention however colonic mass not excluded.  Critical care was consulted because he continued to have several episodes of large volume hematochezia.   Colonoscopy and upper endoscopy completed 02/01/2021 showed no evidence of bleed in the upper, no evidence of bleed with exam of distal ileum on colonoscopy, blood throughout the large  intestine with bilateral diverticulosis. Tagged scan on 2/6 was also unrevealing. Capsule study initiated 2/7, showed red blood at the very distal ileum entering cecum where red blood is noted.   Ultrasound of the left upper extremity performed due to erythema and edema of the left AC from previous IV. Showed superficial thrombophlebitis within the cephalic vein, no loculated fluid collections.  He denies any pain of his right upper extremity. States he feels there is something there. Notes the redness and swelling have been unchanged.    Review of Systems: Review of Systems  Constitutional: Negative for chills, diaphoresis and fever.  Respiratory: Negative for shortness of breath.   Cardiovascular: Negative for chest pain.  Gastrointestinal: Positive for blood in stool. Negative for nausea and vomiting.  Skin: Negative for itching and rash.  Neurological: Negative for weakness and headaches.    Past Medical History:  Diagnosis Date  . ALLERGIC RHINITIS 10/24/2007  . HYPERLIPIDEMIA 10/16/2010  . HYPERTENSION 07/25/2007    Social History   Tobacco Use  . Smoking status: Never Smoker  . Smokeless tobacco: Never Used  Substance Use Topics  . Alcohol use: Yes  . Drug use: No    Family History  Problem Relation Age of Onset  .  Hypertension Brother   . Diabetes Brother    Allergies  Allergen Reactions  . Doxycycline Other (See Comments)    Headaches    OBJECTIVE: Blood pressure 113/74, pulse 90, temperature 98.5 F (36.9 C), temperature source Oral, resp. rate 14, height 5\' 11"  (1.803 m), weight 78.6 kg, SpO2 100 %.  Physical Exam Vitals reviewed.  Constitutional:      General: He is  not in acute distress.    Appearance: Normal appearance. He is not ill-appearing.  HENT:     Head: Normocephalic and atraumatic.  Eyes:     Extraocular Movements: Extraocular movements intact.     Conjunctiva/sclera: Conjunctivae normal.  Cardiovascular:     Rate and Rhythm: Normal rate and regular rhythm.     Heart sounds: Normal heart sounds. No murmur heard. No friction rub. No gallop.   Pulmonary:     Effort: Pulmonary effort is normal. No respiratory distress.     Breath sounds: Normal breath sounds. No wheezing or rales.  Abdominal:     General: Abdomen is flat. Bowel sounds are normal. There is no distension.     Palpations: Abdomen is soft.     Tenderness: There is no abdominal tenderness. There is no guarding.  Musculoskeletal:        General: No swelling or tenderness.     Right lower leg: No edema.     Left lower leg: No edema.  Skin:    General: Skin is warm and dry.  Neurological:     Mental Status: He is alert and oriented to person, place, and time.  Psychiatric:        Mood and Affect: Mood normal.        Behavior: Behavior normal.        Thought Content: Thought content normal.        Judgment: Judgment normal.     Lab Results Lab Results  Component Value Date   WBC 8.1 02/05/2021   HGB 8.2 (L) 02/05/2021   HCT 25.0 (L) 02/05/2021   MCV 89.6 02/05/2021   PLT 154 02/05/2021    Lab Results  Component Value Date   CREATININE 0.91 02/04/2021   BUN 12 02/04/2021   NA 135 02/04/2021   K 3.6 02/04/2021   CL 107 02/04/2021   CO2 19 (L) 02/04/2021    Lab Results  Component Value Date   ALT 28 01/29/2021   AST 22 01/29/2021   ALKPHOS 42 01/29/2021   BILITOT 0.8 01/29/2021     Microbiology: Recent Results (from the past 240 hour(s))  SARS CORONAVIRUS 2 (TAT 6-24 HRS) Nasopharyngeal Nasopharyngeal Swab     Status: None   Collection Time: 01/29/21  8:00 PM   Specimen: Nasopharyngeal Swab  Result Value Ref Range Status   SARS Coronavirus 2 NEGATIVE  NEGATIVE Final    Comment: (NOTE) SARS-CoV-2 target nucleic acids are NOT DETECTED.  The SARS-CoV-2 RNA is generally detectable in upper and lower respiratory specimens during the acute phase of infection. Negative results do not preclude SARS-CoV-2 infection, do not rule out co-infections with other pathogens, and should not be used as the sole basis for treatment or other patient management decisions. Negative results must be combined with clinical observations, patient history, and epidemiological information. The expected result is Negative.  Fact Sheet for Patients: SugarRoll.be  Fact Sheet for Healthcare Providers: https://www.woods-mathews.com/  This test is not yet approved or cleared by the [place] FDA and  has been authorized for detection and/or diagnosis of  SARS-CoV-2 by FDA under an Emergency Use Authorization (EUA). This EUA will remain  in effect (meaning this test can be used) for the duration of the COVID-19 declaration under Se ction 564(b)(1) of the Act, 21 U.S.C. section 360bbb-3(b)(1), unless the authorization is terminated or revoked sooner.  Performed at [facility], [place] [address] [place], [place] [address]   Culture, blood (routine x 2)     Status: None (Preliminary result)   Collection Time: 02/04/21  3:08 PM   Specimen: BLOOD  Result Value Ref Range Status   Specimen Description BLOOD RIGHT ANTECUBITAL  Final   Special Requests   Final    BOTTLES DRAWN AEROBIC AND ANAEROBIC Blood Culture adequate volume   Culture   Final    NO GROWTH < 24 HOURS Performed at [facility], [place] [address], [place], [place] [address]    Report Status PENDING  Incomplete  Culture, blood (routine x 2)     Status: None (Preliminary result)   Collection Time: 02/04/21  3:09 PM   Specimen: BLOOD  Result Value Ref Range Status   Specimen Description BLOOD RIGHT ANTECUBITAL  Final   Special Requests   Final     BOTTLES DRAWN AEROBIC AND ANAEROBIC Blood Culture adequate volume   Culture   Final    NO GROWTH < 24 HOURS Performed at [facility], [place] [address], [place], [place] [address]    Report Status PENDING  Incomplete    Wade Asebedo Dutch Quint, DO PGY-2 [facility] [place] [phone] pager    02/05/2021, 11:02 AM

## 2021-02-04 NOTE — Progress Notes (Addendum)
Cross covering ICU physician  Called to bedside emergently for vasovagal  Event after large bloody bowel movement. BP stable and pt alert and conversant.   hgb at 1300 today was low 7 so ordered stat transfusion and labs.  No coags checked this admission so ordered and will follow and replace as needed Calcium also low at last check and has received numerous units over the hospitalization, last on 02/03/21  No active bleed ever identified on imaging per chart review but capsule indicated possible "stuttering R sided diverticular bleed" per GI note.   If reoccurs may need IR for embolization +/-repeat angio prior.   Dr Carson Myrtle Updated family at bedside.   Additional Critical care time: The patient is critically ill with multiple organ systems failure and requires high complexity decision making for assessment and support, frequent evaluation and titration of therapies, application of advanced monitoring technologies and extensive interpretation of multiple databases.  Critical care time 37 mins. This represents my time independent of the NPs time taking care of the pt. This is excluding procedures.    Audria Nine DO [facility] 02/04/2021, 7:33 PM See Amion for pager If no response to pager, please call [phone] until 1900 After 1900 please call [facility] [phone]

## 2021-02-04 NOTE — Progress Notes (Signed)
Progress Note   Subjective  Patient states he is doing better today. He denies any rectal bleeding since 7 AM yesterday. Capsule study done and interpreted as below. Wife at bedside.    Objective   Vital signs in last 24 hours: Temp:  [98.1 F (36.7 C)-98.6 F (37 C)] 98.3 F (36.8 C) (02/08 1201) Pulse Rate:  [72-94] 78 (02/08 1201) Resp:  [10-27] 18 (02/08 1201) BP: (100-143)/(48-88) 134/81 (02/08 1201) SpO2:  [95 %-100 %] 100 % (02/08 1201) Last BM Date: 02/02/21 General:    AA male in NAD Abdomen:  Soft, nontender and nondistended.  Extremities:  Without edema. Neurologic:  Alert and oriented,  grossly normal neurologically. Psych:  Cooperative. Normal mood and affect.  Intake/Output from previous day: 02/07 0701 - 02/08 0700 In: 415 [P.O.:100; Blood:315] Out: 600 [Urine:600] Intake/Output this shift: Total I/O In: -  Out: 350 [Urine:350]  Lab Results: Recent Labs    02/03/21 0613 02/03/21 1559 02/03/21 2123 02/04/21 0521  WBC 8.8  --   --   --   HGB 6.5* 7.8* 7.4* 7.2*  HCT 19.2* 24.2* 22.6* 22.0*  PLT 127*  --   --   --    BMET Recent Labs    02/02/21 0644 02/03/21 0613  NA 138 137  K 3.8 3.7  CL 109 107  CO2 21* 23  GLUCOSE 84 83  BUN 14 11  CREATININE 0.98 0.80  CALCIUM 7.5* 7.2*   LFT No results for input(s): PROT, ALBUMIN, AST, ALT, ALKPHOS, BILITOT, BILIDIR, IBILI in the last 72 hours. PT/INR No results for input(s): LABPROT, INR in the last 72 hours.  Studies/Results: [place] LT UPPER EXTREM LTD SOFT TISSUE NON VASCULAR  Result Date: 02/03/2021 CLINICAL DATA:  Erythema edema in the left AC EXAM: ULTRASOUND left UPPER EXTREMITY LIMITED TECHNIQUE: Ultrasound examination of the upper extremity soft tissues was performed in the area of clinical concern. COMPARISON:  None. FINDINGS: Within the area of interest within the left antecubital fossa there is a superficial cephalic venous thrombus which is partially occlusive extending from the  antecubital fossa to the mid upper arm. No loculated fluid collections or soft tissue mass is seen. IMPRESSION: Superficial thrombophlebitis within the cephalic vein within the area of interest. No loculated fluid collections. Electronically Signed   By: Prudencio Pair M.D.   On: 02/03/2021 22:58       Assessment / Plan:    65 y/o male admitted with GI bleeding. His workup thus far has included EGD and colonoscopy, CTA x 2, tagged RBC scan, and most recently capsule endoscopy. Capsule shows red blood at the very distal ileum entering cecum where red blood is noted. There was no blood in the more proximal small bowel and suspect this is refluxed into the small bowel from cecum given Dr. Donneta Romberg colonoscopy result. Overall, based on findings of workup today, I think he most likely has a stuttering right sided diverticular bleed. Meckel's diverticulum also a consideration but I think less likely. Despite blood noted on capsule exam he has not passed anything further per rectum since yesterday AM. He did receive a RBC transfusion yesterday, Hgb in low 7s this AM.   Discussed the situation with the patient and his wife. Hopefully this resolves on its own with more time. If not, and he has brisk / recurrent bleeding would touch base with IR to discuss embolization, although they may want another tagged RBC scan or CTA prior to embolization to confirm  location. We discussed that surgery is a last report here and hopefully that is not needed. He is tolerating liquid diet, no clinical bleeding since yesterday AM. Will advance him to soft diet and see how he does today. If Hgb stable tomorrow and no further bleeding today or tomorrow AM may be able to go home tomorrow PM. Otherwise trend Hgb. Call with questions, we will reassess him in the AM.  [place] Cellar, MD [facility]

## 2021-02-04 NOTE — Consult Note (Signed)
Patient was in the ECHO lab this afternoon, unable to evaluate at the bedside. Per chart review, patient's left upper extremity US showed superficial cephalic venus thrombus, consistent with catheter associated thrombophlebitis. Would recommend switching patient to vancomycin. Will follow up blood cultures. Formal consult to follow.    Harlow Ohms, DO PGY-2 [facility] [place] [phone] pager    02/04/2021, 3:58 PM

## 2021-02-04 NOTE — Significant Event (Signed)
Rapid Response Event Note   Reason for Call :  Code blue called for vasovagal episode  10 minutes prior pt up to bathroom and had large, BRBPR. Pt assisted back to bed and again had another bright, red, bloody BM- this time he went unresponsive and RN noted he had decreased respirations. Code blue activated. Pt spontaneously aroused.  Initial Focused Assessment:  Pt lying in bed. Warm, diaphoretic, pale. No distress. AO, moving all extremities. Grips equal. Peripheral pulses 2+.   VS: BP 124/73, HR 103, RR 16, 100% on RA  Interventions:  -20 gauge IV access established x2 -Labs sent: H&H, BMP, Coags -Plan to tranfuse 2U PRBC, blood bank notified  Plan of Care:  -Transfuse PRBC, post transfusion H&H -Strict I&O -Bedrest overnight -PCU level of care, frequent monitoring of VS  Call rapid response for additional needs  Event Summary:  MD Notified: Dr. Ruthann Cancer Call Time: 1904 Arrival Time: 1906 End Time: 1935  Casimer Bilis, RN

## 2021-02-04 NOTE — Progress Notes (Addendum)
   NAME:  Jay Robertson, MRN:  [id number], DOB:  [birth year], LOS: 5 ADMISSION DATE:  01/29/2021, CONSULTATION DATE:  02/04/21 REFERRING MD:  TRH CHIEF COMPLAINT:  hematochezia  Brief History:  65 year old admitted with bright red blood per rectum with intermittent hematochezia whom we are consulted for ongoing large-volume hematochezia.   Past Medical History:  Hypertension  Significant Hospital Events:  CTA angio abdomen pelvis 2/2 was negative CTA angio abdomen pelvis 2/4 was negative Colonoscopy and upper endoscopy completed 02/01/2021 showed no evidence of bleed in the upper, no evidence of bleed with exam of distal ileum on colonoscopy, blood throughout the large  intestine with bilateral diverticulosis 2/6Tagged scan unrevealing. Consults:  PCCM GI VIR  Procedures:  EGD and colonoscopy 02/01/2021  Significant Diagnostic Tests:  CT abdomen pelvis angiogram 2/2, 2/4 both negative  Micro Data:  H. pylori pending from biopsy on endoscopy  Antimicrobials:  N/A  Interim History / Subjective:  No issues overnight   Objective   Blood pressure 127/77, pulse 81, temperature 98.3 F (36.8 C), temperature source Oral, resp. rate 19, height 5\' 11"  (1.803 m), weight 79.4 kg, SpO2 99 %.        Intake/Output Summary (Last 24 hours) at 02/04/2021 0827 Last data filed at 02/04/2021 8185 Gross per 24 hour  Intake 415 ml  Output 800 ml  Net -385 ml   Filed Weights   02/02/21 0430 02/03/21 0500 02/03/21 0857  Weight: 80.5 kg 79.4 kg 79.4 kg    Examination:  General this is a otherwise healthy appearing 65 year old male resting in bed HENT NCAT no JVD Pulm clear. Decreased Card RRR abd soft Ext warm and dry  Neuro intact   Resolved Hospital Problem list   Hemorrhagic shock   Assessment & Plan:  GIB : GI thinks stuttering Diverticular bleed is etiology of bleeding. Issue is they have not been able to localize site to right or left colon. Got blood again yesterday. Capsule study  initiated 2/7 (awating results). Hgb stable over last 24 hrs. Describes no concerns for about 20 hrs now (he keeps notes) Plan Cont clear liquids Await results of video endoscopy  Am cbc w/ trigger for transfusion of <7 If has sig bleed -->CT angiogram f/b IR If sig slow bleeding ->tagged RBC study Ck orthostatics this am   If no issue will send to progressive   Left arm AC vein phlebitis Plan Day 1/5 keflex    Best practice (evaluated daily)  Diet: clears  Pain/Anxiety/Delirium protocol (if indicated): n/a VAP protocol (if indicated): n/a DVT prophylaxis: chemoprophylaxis contraindicaed in setting of GI bleed GI prophylaxis: PPI Glucose control: n/a Mobility: bed rest for now Disposition: ICU  Goals of Care:  Last date of multidisciplinary goals of care discussion:n/a Family and staff present:  Summary of discussion:  Follow up goals of care discussion due: 2/11 Code Status: Full  Erick Colace ACNP-BC [place] Pager # [phone] OR # [phone] if no answer

## 2021-02-04 NOTE — Progress Notes (Signed)
At approximately 1905 the family member of this patient called nursing staff for help with patient. Nursing staff responded and found patient sitting in bed, unresponsive to verbal or physical stimuli with agonal breathing. Initially a code was called but a strong pulse was found and patient began to breathe normal on his own, situation was then changed to a rapid response. A large bloody bowel movement was found under the patient during the rapid response. CBG obtained during rapid was 96. Patient became responsive shortly after response was called. Rapid response nurses notified primary physician of event and provider promptly came to bedside to assess patient and spoke to family as well. Provider and rapid response team entered orders for care, please refer to orders and Rapid Response Team documentation.

## 2021-02-05 ENCOUNTER — Encounter (HOSPITAL_COMMUNITY): Payer: Self-pay | Admitting: Gastroenterology

## 2021-02-05 ENCOUNTER — Inpatient Hospital Stay (HOSPITAL_COMMUNITY): Payer: Medicare Other

## 2021-02-05 DIAGNOSIS — R55 Syncope and collapse: Secondary | ICD-10-CM | POA: Diagnosis not present

## 2021-02-05 DIAGNOSIS — T82868A Thrombosis of vascular prosthetic devices, implants and grafts, initial encounter: Secondary | ICD-10-CM

## 2021-02-05 DIAGNOSIS — K922 Gastrointestinal hemorrhage, unspecified: Secondary | ICD-10-CM | POA: Diagnosis not present

## 2021-02-05 LAB — BLOOD CULTURE ID PANEL (REFLEXED) - BCID2

## 2021-02-05 LAB — TYPE AND SCREEN
ABO/RH(D): O POS
Antibody Screen: NEGATIVE
Unit division: 0
Unit division: 0
Unit division: 0

## 2021-02-05 LAB — CBC
HCT: 25 % — ABNORMAL LOW (ref 39.0–52.0)
Hemoglobin: 8.2 g/dL — ABNORMAL LOW (ref 13.0–17.0)
MCH: 29.4 pg (ref 26.0–34.0)
MCHC: 32.8 g/dL (ref 30.0–36.0)
MCV: 89.6 fL (ref 80.0–100.0)
Platelets: 154 10*3/uL (ref 150–400)
RBC: 2.79 MIL/uL — ABNORMAL LOW (ref 4.22–5.81)
RDW: 17.1 % — ABNORMAL HIGH (ref 11.5–15.5)
WBC: 8.1 10*3/uL (ref 4.0–10.5)
nRBC: 0.2 % (ref 0.0–0.2)

## 2021-02-05 LAB — BPAM RBC
Blood Product Expiration Date: 202203082359
Blood Product Expiration Date: 202203102359
Blood Product Expiration Date: 202203102359
ISSUE DATE / TIME: 202202071014
ISSUE DATE / TIME: 202202082007
ISSUE DATE / TIME: 202202082328
Unit Type and Rh: 5100
Unit Type and Rh: 5100
Unit Type and Rh: 5100

## 2021-02-05 LAB — HEMOGLOBIN AND HEMATOCRIT, BLOOD
HCT: 25.9 % — ABNORMAL LOW (ref 39.0–52.0)
HCT: 21.7 % — ABNORMAL LOW (ref 39.0–52.0)
Hemoglobin: 9.3 g/dL — ABNORMAL LOW (ref 13.0–17.0)
Hemoglobin: 7.6 g/dL — ABNORMAL LOW (ref 13.0–17.0)

## 2021-02-05 MED ORDER — IOHEXOL 350 MG/ML SOLN
100.0000 mL | Freq: Once | INTRAVENOUS | Status: AC | PRN
  Administered 2021-02-05: 100 mL via INTRAVENOUS

## 2021-02-05 NOTE — Progress Notes (Signed)
Addendum created in Endoscopy chart to fix times.

## 2021-02-05 NOTE — Progress Notes (Signed)
PROGRESS NOTE    CAYLE THUNDER  XIP:[id number] DOB: 12/06/1956 DOA: 01/29/2021 PCP: Billie Ruddy, MD   Chief Complaint  Patient presents with  . GI Bleeding    Brief Narrative:  65 year old with prior history of diverticulosis, essential hypertension admitted with bright red blood per rectum with intermittent hematochezia whom we are consulted for ongoing large-volume hematochezia.  He underwent multiple CT angiograms that were negative.  Colonoscopy and upper endoscopy completed on 2 5/22 showed no evidence of bleeding in the upper GI, no evidence of bleed on colonoscopy.  But there was blood throughout the large intestine with diverticulosis. Tagged RBC scan on 02/02/2021 was unrevealing.  Patient underwent 9  units of PRBC transfusion and repeat hemoglobin this morning is around 9.  No bleeding overnight.   Assessment & Plan:   Principal Problem:   Acute lower GI bleeding Active Problems:   Essential hypertension   Syncope   GI bleed   Diverticulosis of colon with hemorrhage   GI bleed/diverticular bleed: He underwent multiple CT angiogram of the abdomen that were negative.  Colonoscopy and upper endoscopy completed on 2/ 5/22 showed no evidence of bleeding in the upper GI, no evidence of bleed on colonoscopy.  But there was blood throughout the large intestine with diverticulosis. Tagged RBC scan on 02/02/2021 was unrevealing.  Patient underwent 9  units of PRBC transfusion and repeat hemoglobin this morning is around 9.  Clear liquid diet and advance as tolerated.  IR consulted to see if embolization is needed.    Essential Hypertension;  Well controlled.  Will check orthostatics. Today.     Thrombophlebitis: on IV vancomycin .  Blood cultures ordered.  ID consulted.  Echocardiogram ordered. It shows Left ventricular ejection fraction, by estimation, is 60  to 65%. The left ventricle has normal function. The left ventricle has no  regional wall motion abnormalities. The  left ventricular internal cavity  size was normal in size. There is  mild left ventricular hypertrophy. Left ventricular diastolic parameters  are consistent with Grade I diastolic dysfunction (impaired relaxation   DVT prophylaxis: SCDs Code Status: Full code Family Communication: Family at bedside Disposition:   Status is: Inpatient  Remains inpatient appropriate because:Ongoing diagnostic testing needed not appropriate for outpatient work up, Unsafe d/c plan and Inpatient level of care appropriate due to severity of illness   Dispo: The patient is from: Home              Anticipated d/c is to: Home              Anticipated d/c date is: > 3 days              Patient currently is not medically stable to d/c.   Difficult to place patient No       Level of care: Progressive Consultants:   PCCM  Gastroenterology  Infectious disease  Radiology  Procedures: EGD Colonoscopy  Antimicrobials: None   Subjective: No bleeding overnight  Objective: Vitals:   02/05/21 0130 02/05/21 0345 02/05/21 0814 02/05/21 1143  BP: 102/72 113/74    Pulse:  90    Resp: 13 14    Temp: 98.3 F (36.8 C) 98.6 F (37 C) 98.5 F (36.9 C) 98.1 F (36.7 C)  TempSrc: Oral Oral Oral Oral  SpO2: 100% 100%    Weight:      Height:        Intake/Output Summary (Last 24 hours) at 02/05/2021 1314 Last data filed at  02/05/2021 1258 Gross per 24 hour  Intake 1110 ml  Output 1500 ml  Net -390 ml   Filed Weights   02/03/21 0500 02/03/21 0857 02/05/21 0030  Weight: 79.4 kg 79.4 kg 78.6 kg    Examination:  General exam: Appears calm and comfortable  Respiratory system: Clear to auscultation. Respiratory effort normal. Cardiovascular system: S1 & S2 heard, RRR. No JVD,. No pedal edema. Gastrointestinal system: Abdomen is nondistended, soft and nontender. Normal bowel sounds heard. Central nervous system: Alert and oriented. No focal neurological deficits. Extremities: Symmetric 5 x 5  power. Skin: No rashes, lesions or ulcers Psychiatry: Mood & affect appropriate.     Data Reviewed: I have personally reviewed following labs and imaging studies  CBC: Recent Labs  Lab 01/30/21 1456 01/31/21 0528 02/03/21 0613 02/03/21 1559 02/04/21 0521 02/04/21 1330 02/04/21 1912 02/05/21 0332 02/05/21 0926  WBC 8.2  --  8.8  --   --   --   --   --  8.1  HGB 8.6*   < > 6.5*   < > 7.2* 7.7* 6.9* 9.3* 8.2*  HCT 23.9*   < > 19.2*   < > 22.0* 22.5* 20.7* 25.9* 25.0*  MCV 90.2  --  89.7  --   --   --   --   --  89.6  PLT 179  --  127*  --   --   --   --   --  154   < > = values in this interval not displayed.    Basic Metabolic Panel: Recent Labs  Lab 01/30/21 1456 02/02/21 0644 02/03/21 0613 02/04/21 1912  NA 137 138 137 135  K 3.9 3.8 3.7 3.6  CL 109 109 107 107  CO2 20* 21* 23 19*  GLUCOSE 125* 84 83 115*  BUN 17 14 11 12   CREATININE 0.97 0.98 0.80 0.91  CALCIUM 7.8* 7.5* 7.2* 7.7*    GFR: Estimated Creatinine Clearance: 86.2 mL/min (by C-G formula based on SCr of 0.91 mg/dL).  Liver Function Tests: No results for input(s): AST, ALT, ALKPHOS, BILITOT, PROT, ALBUMIN in the last 168 hours.  CBG: Recent Labs  Lab 01/31/21 1235 02/04/21 1906  GLUCAP 124* 96     Recent Results (from the past 240 hour(s))  SARS CORONAVIRUS 2 (TAT 6-24 HRS) Nasopharyngeal Nasopharyngeal Swab     Status: None   Collection Time: 01/29/21  8:00 PM   Specimen: Nasopharyngeal Swab  Result Value Ref Range Status   SARS Coronavirus 2 NEGATIVE NEGATIVE Final    Comment: (NOTE) SARS-CoV-2 target nucleic acids are NOT DETECTED.  The SARS-CoV-2 RNA is generally detectable in upper and lower respiratory specimens during the acute phase of infection. Negative results do not preclude SARS-CoV-2 infection, do not rule out co-infections with other pathogens, and should not be used as the sole basis for treatment or other patient management decisions. Negative results must be  combined with clinical observations, patient history, and epidemiological information. The expected result is Negative.  Fact Sheet for Patients: SugarRoll.be  Fact Sheet for Healthcare Providers: https://www.woods-mathews.com/  This test is not yet approved or cleared by the [place] FDA and  has been authorized for detection and/or diagnosis of SARS-CoV-2 by FDA under an Emergency Use Authorization (EUA). This EUA will remain  in effect (meaning this test can be used) for the duration of the COVID-19 declaration under Se ction 564(b)(1) of the Act, 21 U.S.C. section 360bbb-3(b)(1), unless the authorization is terminated or revoked sooner.  Performed at [facility], [place] [address], [place], [place] [address]   Culture, blood (routine x 2)     Status: None (Preliminary result)   Collection Time: 02/04/21  3:08 PM   Specimen: BLOOD  Result Value Ref Range Status   Specimen Description BLOOD RIGHT ANTECUBITAL  Final   Special Requests   Final    BOTTLES DRAWN AEROBIC AND ANAEROBIC Blood Culture adequate volume   Culture   Final    NO GROWTH < 24 HOURS Performed at [facility], [place] [address], [place], [place] [address]    Report Status PENDING  Incomplete  Culture, blood (routine x 2)     Status: None (Preliminary result)   Collection Time: 02/04/21  3:09 PM   Specimen: BLOOD  Result Value Ref Range Status   Specimen Description BLOOD RIGHT ANTECUBITAL  Final   Special Requests   Final    BOTTLES DRAWN AEROBIC AND ANAEROBIC Blood Culture adequate volume   Culture   Final    NO GROWTH < 24 HOURS Performed at [facility], [place] [address] [place], [place] [address]    Report Status PENDING  Incomplete         Radiology Studies: ECHOCARDIOGRAM COMPLETE  Result Date: 02/04/2021    ECHOCARDIOGRAM REPORT   Patient Name:   ZAKAR BROSCH Date of Exam: 02/04/2021 Medical Rec #:  [id number]   Height:       71.0 in  Accession #:    [id number]  Weight:       175.0 lb Date of Birth:  Sep 15, 1956    BSA:          1.992 m Patient Age:    47 years    BP:           134/81 mmHg Patient Gender: M           HR:           80 bpm. Exam Location:  Inpatient Procedure: 2D Echo Indications:    murmur  History:        Patient has no prior history of Echocardiogram examinations.                 Signs/Symptoms:Syncope; Risk Factors:Hypertension.  Sonographer:    Johny Chess Referring Phys: [id number] LAURA P CLARK  Sonographer Comments: Image acquisition challenging due to respiratory motion. IMPRESSIONS  1. Left ventricular ejection fraction, by estimation, is 60 to 65%. The left ventricle has normal function. The left ventricle has no regional wall motion abnormalities. There is mild left ventricular hypertrophy. Left ventricular diastolic parameters are consistent with Grade I diastolic dysfunction (impaired relaxation).  2. Right ventricular systolic function is normal. The right ventricular size is normal.  3. The mitral valve is normal in structure. No evidence of mitral valve regurgitation. No evidence of mitral stenosis.  4. The aortic valve has an indeterminant number of cusps. Aortic valve regurgitation is not visualized. No aortic stenosis is present.  5. The inferior vena cava is normal in size with greater than 50% respiratory variability, suggesting right atrial pressure of 3 mmHg. FINDINGS  Left Ventricle: Left ventricular ejection fraction, by estimation, is 60 to 65%. The left ventricle has normal function. The left ventricle has no regional wall motion abnormalities. The left ventricular internal cavity size was normal in size. There is  mild left ventricular hypertrophy. Left ventricular diastolic parameters are consistent with Grade I diastolic dysfunction (impaired relaxation). Right Ventricle: The right  ventricular size is normal. Right ventricular systolic function is normal. Left Atrium: Left atrial size was normal in  size. Right Atrium: Right atrial size was normal in size. Pericardium: Trivial pericardial effusion is present. Mitral Valve: The mitral valve is normal in structure. No evidence of mitral valve regurgitation. No evidence of mitral valve stenosis. Tricuspid Valve: The tricuspid valve is normal in structure. Tricuspid valve regurgitation is trivial. No evidence of tricuspid stenosis. Aortic Valve: The aortic valve has an indeterminant number of cusps. Aortic valve regurgitation is not visualized. No aortic stenosis is present. Pulmonic Valve: The pulmonic valve was normal in structure. Pulmonic valve regurgitation is not visualized. No evidence of pulmonic stenosis. Aorta: The aortic root is normal in size and structure. Venous: The inferior vena cava is normal in size with greater than 50% respiratory variability, suggesting right atrial pressure of 3 mmHg. IAS/Shunts: No atrial level shunt detected by color flow Doppler.  LEFT VENTRICLE PLAX 2D LVIDd:         3.90 cm  Diastology LVIDs:         2.40 cm  LV e' medial:    8.16 cm/s LV PW:         1.10 cm  LV E/e' medial:  10.6 LV IVS:        1.40 cm  LV e' lateral:   11.00 cm/s LVOT diam:     2.10 cm  LV E/e' lateral: 7.8 LV SV:         90 LV SV Index:   45 LVOT Area:     3.46 cm  RIGHT VENTRICLE             IVC RV S prime:     15.90 cm/s  IVC diam: 1.20 cm TAPSE (M-mode): 2.1 cm LEFT ATRIUM             Index       RIGHT ATRIUM           Index LA diam:        3.20 cm 1.61 cm/m  RA Area:     12.20 cm LA Vol (A2C):   35.9 ml 18.02 ml/m RA Volume:   28.90 ml  14.50 ml/m LA Vol (A4C):   38.8 ml 19.47 ml/m LA Biplane Vol: 38.4 ml 19.27 ml/m  AORTIC VALVE LVOT Vmax:   144.00 cm/s LVOT Vmean:  94.700 cm/s LVOT VTI:    0.259 m  AORTA Ao Root diam: 3.10 cm Ao Asc diam:  3.20 cm MITRAL VALVE MV Area (PHT): 3.99 cm    SHUNTS MV Decel Time: 190 msec    Systemic VTI:  0.26 m MV E velocity: 86.30 cm/s  Systemic Diam: 2.10 cm MV A velocity: 83.40 cm/s MV E/A ratio:  1.03  Kirk Ruths MD Electronically signed by Kirk Ruths MD Signature Date/Time: 02/04/2021/4:13:26 PM    Final    [place] LT UPPER EXTREM LTD SOFT TISSUE NON VASCULAR  Result Date: 02/03/2021 CLINICAL DATA:  Erythema edema in the left AC EXAM: ULTRASOUND left UPPER EXTREMITY LIMITED TECHNIQUE: Ultrasound examination of the upper extremity soft tissues was performed in the area of clinical concern. COMPARISON:  None. FINDINGS: Within the area of interest within the left antecubital fossa there is a superficial cephalic venous thrombus which is partially occlusive extending from the antecubital fossa to the mid upper arm. No loculated fluid collections or soft tissue mass is seen. IMPRESSION: Superficial thrombophlebitis within the cephalic vein within the area of interest. No loculated fluid  collections. Electronically Signed   By: Prudencio Pair M.D.   On: 02/03/2021 22:58        Scheduled Meds: . pantoprazole  40 mg Oral BID AC   Continuous Infusions: . sodium chloride    . vancomycin Stopped (02/05/21 1128)     LOS: 6 days       Hosie Poisson, MD [facility]   To contact the attending provider between 7A-7P or the covering provider during after hours 7P-7A, please log into the web site [email or web address] and access using universal [place] password for that web site. If you do not have the password, please call the hospital operator.  02/05/2021, 1:14 PM

## 2021-02-05 NOTE — Progress Notes (Signed)
Progress Note  Chief Complaint:   GI bleed      ASSESSMENT / PLAN:    # 65 yo male with painless hematochezia requiring multiple units of blood. So far workup has included EGD, colonoscopy, tagged RBC scan,  CTA x 2 and VCE.  Fresh blood in cecum on VCE, also in terminal ileum but felt to be reflux. Right diverticular bleed suspected. He bled again last night, had syncopal episode. Recurrent bleeding at 11 am today  Received additional two units of blood last night. Hgb 8.2 this am.  --He is about to go down now for repeat CTA abd / pelvis. -- IR has already evaluated for possible angiogram vs embolization. --Patient and sister had several questions about the bleeding ( possible source, potential treatments). We reviewed his VCE results. All questions answered.           SUBJECTIVE:   Feels okay now but had syncopal episode    Scheduled inpatient medications:  . pantoprazole  40 mg Oral BID AC   Continuous inpatient infusions:  . sodium chloride    . vancomycin Stopped (02/05/21 1128)   PRN inpatient medications: acetaminophen **OR** acetaminophen, ALPRAZolam, ondansetron **OR** ondansetron (ZOFRAN) IV  Vital signs in last 24 hours: Temp:  [98.1 F (36.7 C)-98.8 F (37.1 C)] 98.1 F (36.7 C) (02/09 1143) Pulse Rate:  [78-103] 90 (02/09 0345) Resp:  [13-23] 14 (02/09 0345) BP: (99-137)/(66-91) 113/74 (02/09 0345) SpO2:  [97 %-100 %] 100 % (02/09 0345) Weight:  [78.6 kg] 78.6 kg (02/09 0030) Last BM Date: 02/04/21  Intake/Output Summary (Last 24 hours) at 02/05/2021 1245 Last data filed at 02/05/2021 0816 Gross per 24 hour  Intake 870 ml  Output 1200 ml  Net -330 ml     Physical Exam:  . General: Alert male in NAD. Sister in room . Heart:  Regular rate and rhythm. No lower extremity edema . Pulmonary: Normal respiratory effort . Abdomen: Soft, nondistended, nontender. Normal bowel sounds.  . Neurologic: Alert and oriented . Psych: Pleasant. Cooperative.    Filed Weights   02/03/21 0500 02/03/21 0857 02/05/21 0030  Weight: 79.4 kg 79.4 kg 78.6 kg    Intake/Output from previous day: 02/08 0701 - 02/09 0700 In: 1845 [P.O.:1215; Blood:630] Out: 1250 [Urine:1250] Intake/Output this shift: Total I/O In: 240 [P.O.:240] Out: 300 [Urine:300]    Lab Results: Recent Labs    02/03/21 0613 02/03/21 1559 02/04/21 1912 02/05/21 0332 02/05/21 0926  WBC 8.8  --   --   --  8.1  HGB 6.5*   < > 6.9* 9.3* 8.2*  HCT 19.2*   < > 20.7* 25.9* 25.0*  PLT 127*  --   --   --  154   < > = values in this interval not displayed.   BMET Recent Labs    02/03/21 0613 02/04/21 1912  NA 137 135  K 3.7 3.6  CL 107 107  CO2 23 19*  GLUCOSE 83 115*  BUN 11 12  CREATININE 0.80 0.91  CALCIUM 7.2* 7.7*   LFT No results for input(s): PROT, ALBUMIN, AST, ALT, ALKPHOS, BILITOT, BILIDIR, IBILI in the last 72 hours. PT/INR Recent Labs    02/04/21 1932  LABPROT 16.1*  INR 1.3*   Hepatitis Panel No results for input(s): HEPBSAG, HCVAB, HEPAIGM, HEPBIGM in the last 72 hours.  ECHOCARDIOGRAM COMPLETE  Result Date: 02/04/2021    ECHOCARDIOGRAM REPORT   Patient Name:   Jay Robertson Date of Exam: 02/04/2021  Medical Rec #:  [id number]   Height:       71.0 in Accession #:    [id number]  Weight:       175.0 lb Date of Birth:  [birth year]    BSA:          1.992 m Patient Age:    53 years    BP:           134/81 mmHg Patient Gender: M           HR:           80 bpm. Exam Location:  Inpatient Procedure: 2D Echo Indications:    murmur  History:        Patient has no prior history of Echocardiogram examinations.                 Signs/Symptoms:Syncope; Risk Factors:Hypertension.  Sonographer:    Johny Chess Referring Phys: [id number] LAURA P CLARK  Sonographer Comments: Image acquisition challenging due to respiratory motion. IMPRESSIONS  1. Left ventricular ejection fraction, by estimation, is 60 to 65%. The left ventricle has normal function. The left ventricle has no  regional wall motion abnormalities. There is mild left ventricular hypertrophy. Left ventricular diastolic parameters are consistent with Grade I diastolic dysfunction (impaired relaxation).  2. Right ventricular systolic function is normal. The right ventricular size is normal.  3. The mitral valve is normal in structure. No evidence of mitral valve regurgitation. No evidence of mitral stenosis.  4. The aortic valve has an indeterminant number of cusps. Aortic valve regurgitation is not visualized. No aortic stenosis is present.  5. The inferior vena cava is normal in size with greater than 50% respiratory variability, suggesting right atrial pressure of 3 mmHg. FINDINGS  Left Ventricle: Left ventricular ejection fraction, by estimation, is 60 to 65%. The left ventricle has normal function. The left ventricle has no regional wall motion abnormalities. The left ventricular internal cavity size was normal in size. There is  mild left ventricular hypertrophy. Left ventricular diastolic parameters are consistent with Grade I diastolic dysfunction (impaired relaxation). Right Ventricle: The right ventricular size is normal. Right ventricular systolic function is normal. Left Atrium: Left atrial size was normal in size. Right Atrium: Right atrial size was normal in size. Pericardium: Trivial pericardial effusion is present. Mitral Valve: The mitral valve is normal in structure. No evidence of mitral valve regurgitation. No evidence of mitral valve stenosis. Tricuspid Valve: The tricuspid valve is normal in structure. Tricuspid valve regurgitation is trivial. No evidence of tricuspid stenosis. Aortic Valve: The aortic valve has an indeterminant number of cusps. Aortic valve regurgitation is not visualized. No aortic stenosis is present. Pulmonic Valve: The pulmonic valve was normal in structure. Pulmonic valve regurgitation is not visualized. No evidence of pulmonic stenosis. Aorta: The aortic root is normal in size and  structure. Venous: The inferior vena cava is normal in size with greater than 50% respiratory variability, suggesting right atrial pressure of 3 mmHg. IAS/Shunts: No atrial level shunt detected by color flow Doppler.  LEFT VENTRICLE PLAX 2D LVIDd:         3.90 cm  Diastology LVIDs:         2.40 cm  LV e' medial:    8.16 cm/s LV PW:         1.10 cm  LV E/e' medial:  10.6 LV IVS:        1.40 cm  LV e' lateral:   11.00 cm/s LVOT diam:  2.10 cm  LV E/e' lateral: 7.8 LV SV:         90 LV SV Index:   45 LVOT Area:     3.46 cm  RIGHT VENTRICLE             IVC RV S prime:     15.90 cm/s  IVC diam: 1.20 cm TAPSE (M-mode): 2.1 cm LEFT ATRIUM             Index       RIGHT ATRIUM           Index LA diam:        3.20 cm 1.61 cm/m  RA Area:     12.20 cm LA Vol (A2C):   35.9 ml 18.02 ml/m RA Volume:   28.90 ml  14.50 ml/m LA Vol (A4C):   38.8 ml 19.47 ml/m LA Biplane Vol: 38.4 ml 19.27 ml/m  AORTIC VALVE LVOT Vmax:   144.00 cm/s LVOT Vmean:  94.700 cm/s LVOT VTI:    0.259 m  AORTA Ao Root diam: 3.10 cm Ao Asc diam:  3.20 cm MITRAL VALVE MV Area (PHT): 3.99 cm    SHUNTS MV Decel Time: 190 msec    Systemic VTI:  0.26 m MV E velocity: 86.30 cm/s  Systemic Diam: 2.10 cm MV A velocity: 83.40 cm/s MV E/A ratio:  1.03 Kirk Ruths MD Electronically signed by Kirk Ruths MD Signature Date/Time: 02/04/2021/4:13:26 PM    Final    [place] LT UPPER EXTREM LTD SOFT TISSUE NON VASCULAR  Result Date: 02/03/2021 CLINICAL DATA:  Erythema edema in the left AC EXAM: ULTRASOUND left UPPER EXTREMITY LIMITED TECHNIQUE: Ultrasound examination of the upper extremity soft tissues was performed in the area of clinical concern. COMPARISON:  None. FINDINGS: Within the area of interest within the left antecubital fossa there is a superficial cephalic venous thrombus which is partially occlusive extending from the antecubital fossa to the mid upper arm. No loculated fluid collections or soft tissue mass is seen. IMPRESSION: Superficial  thrombophlebitis within the cephalic vein within the area of interest. No loculated fluid collections. Electronically Signed   By: Prudencio Pair M.D.   On: 02/03/2021 22:58      Principal Problem:   Acute lower GI bleeding Active Problems:   Essential hypertension   Syncope   GI bleed   Diverticulosis of colon with hemorrhage     LOS: 6 days   Tye Savoy ,NP 02/05/2021, 12:45 PM

## 2021-02-05 NOTE — Progress Notes (Addendum)
GI UPDATE:  I spoke with Dr. Dwaine Gale of IR and then the patient and his sister again. Repeat CTA is negative. Patient hemodynamically stable at this time. While rare, will order a Meckel's scan to ensure negative prior to proceeding with more aggressive interventions at this time for suspected diverticular bleeding, which includes provocative angiography vs. surgery. I discussed all of this with the family. They are okay to proceed with Meckel's scan. If this is negative, IR will evaluate the patient, Dr. Earleen Newport, for provocative angiography tomorrow with possible embolization. We discussed risks of this which would include inducing severe bleeding, but that if he is not actively bleeding during angiography then unable to perform embolization with any certainty. We discussed surgical option, which would be right hemicolectomy, however also discussed that without certainty of where in the colon this is coming from, if by chance elsewhere in his colon was the etiology and his right colon was removed, then his bleeding could persist. They had several questions about each option, their preference if provocative angiography if it is being offered per IR. IR has also requested a general surgery consult for backup plan should the patient have severe bleeding during provocative angiography that they are unable to stop with embolization. Plan is for Meckel's scan tomorrow, followed by provocative angiography if Meckel's is negative. Clear liquid diet okay now, NPO after MN, trend Hgb, transfuse additional blood if needed.   Hewitt Cellar, MD [facility]

## 2021-02-05 NOTE — Progress Notes (Signed)
PHARMACY - PHYSICIAN COMMUNICATION CRITICAL VALUE ALERT - BLOOD CULTURE IDENTIFICATION (BCID)  Jay Robertson is an 65 y.o. male who presented to [facility] on 01/29/2021 with a chief complaint of GI bleed.  Assessment:  Found to have LUE superficial cephalic venous thrombus - was started on keflex but mild erythema at site and firm to touch - ID following - now on vancomycin.   BCx 1/4 showing GPC in clusters - BCID finding staph species.  Name of physician (or Provider) Contacted: Dr Karleen Hampshire  Current antibiotics: Vancomycin  Changes to prescribed antibiotics recommended:  No changes to regimen at this time.   Results for orders placed or performed during the hospital encounter of 01/29/21  Blood Culture ID Panel (Reflexed) (Collected: 02/04/2021  3:09 PM)  Result Value Ref Range   Enterococcus faecalis NOT DETECTED NOT DETECTED   Enterococcus Faecium NOT DETECTED NOT DETECTED   Listeria monocytogenes NOT DETECTED NOT DETECTED   Staphylococcus species DETECTED (A) NOT DETECTED   Staphylococcus aureus (BCID) NOT DETECTED NOT DETECTED   Staphylococcus epidermidis NOT DETECTED NOT DETECTED   Staphylococcus lugdunensis NOT DETECTED NOT DETECTED   Streptococcus species NOT DETECTED NOT DETECTED   Streptococcus agalactiae NOT DETECTED NOT DETECTED   Streptococcus pneumoniae NOT DETECTED NOT DETECTED   Streptococcus pyogenes NOT DETECTED NOT DETECTED   A.calcoaceticus-baumannii NOT DETECTED NOT DETECTED   Bacteroides fragilis NOT DETECTED NOT DETECTED   Enterobacterales NOT DETECTED NOT DETECTED   Enterobacter cloacae complex NOT DETECTED NOT DETECTED   Escherichia coli NOT DETECTED NOT DETECTED   Klebsiella aerogenes NOT DETECTED NOT DETECTED   Klebsiella oxytoca NOT DETECTED NOT DETECTED   Klebsiella pneumoniae NOT DETECTED NOT DETECTED   Proteus species NOT DETECTED NOT DETECTED   Salmonella species NOT DETECTED NOT DETECTED   Serratia marcescens NOT DETECTED NOT DETECTED   Haemophilus  influenzae NOT DETECTED NOT DETECTED   Neisseria meningitidis NOT DETECTED NOT DETECTED   Pseudomonas aeruginosa NOT DETECTED NOT DETECTED   Stenotrophomonas maltophilia NOT DETECTED NOT DETECTED   Candida albicans NOT DETECTED NOT DETECTED   Candida auris NOT DETECTED NOT DETECTED   Candida glabrata NOT DETECTED NOT DETECTED   Candida krusei NOT DETECTED NOT DETECTED   Candida parapsilosis NOT DETECTED NOT DETECTED   Candida tropicalis NOT DETECTED NOT DETECTED   Cryptococcus neoformans/gattii NOT DETECTED NOT DETECTED    Antonietta Jewel, PharmD, BCCCP Clinical Pharmacist  Phone: [phone] 02/05/2021 4:18 PM  Please check AMION for all [place] phone numbers After 10:00 PM, call [place] [phone]

## 2021-02-05 NOTE — Progress Notes (Signed)
Interventional Radiology Brief Note:  Jay Robertson is a 65 year old male with history of GI bleed. Patient with large bloody bowel movement this AM.  Has been scanned multiple times in the past week with no localization of bleeding source.   Syncopal event after bloody bowel movement this AM prompting rapid response. BP 124/73 as documented by the RR RN. HgB 6.9, 9.3 this AM. Has received at least 9u PRBCs since admission.  IR reconsulted for possible angiogram vs. Embolization.  Discussed with Dr. Dwaine Gale.  CTA Abdomen Pelvis ordered. IR following.   Brynda Greathouse, MS RD PA-C

## 2021-02-05 NOTE — Progress Notes (Signed)
Pt transferred from bed to bedside commode to attempt BM. BM unsuccessful. Pt wanted to walk from bed to room door 5 times. Pt successfully walked without shortness of breath or feeling lightheaded. HR stable.

## 2021-02-05 NOTE — Progress Notes (Signed)
Hgb is 7.6. Pt without any active bleeding since bowel movement this morning. MD paged.

## 2021-02-05 NOTE — Consult Note (Signed)
Chief Complaint: Patient was seen in consultation today for Mesenteric arteriogram with possible embolization Chief Complaint  Patient presents with  . GI Bleeding   at the request of Dr Doyne Keel   Supervising Physician: Mir, Sharen Heck  Patient Status: [facility] - In-pt  History of Present Illness: Jay Robertson is a 65 y.o. male   Admission 01/30/21--GI bleed and recurrence Pt has had several scans/studies to evaluate GI bleed Endoscopy; colonoscopy- showing diverticulosis; NM bleeding scan; CTA All revealing no bleeding source Another large bloody BM this am-- vagal and hypotensive result; hg 6.9 Rapid response called  Some 10 u RBC transfused this admission Hg 8.2   Request made for IR evaluation CTA today:   IMPRESSION: VASCULAR 1. No evidence for active GI bleeding. 2. Mild atherosclerotic disease. NON-VASCULAR 1. No acute abnormality in the abdomen or pelvis. 2. Endoscopy capsule is located in the cecum. 3. Persistent small densities in left lung lower lobe that could represent postinflammatory changes.  Discussions with Dr Havery Moros, Dr Mir, and Dr Earleen Newport Plan is for probable mesenteric arteriogram with possible embolization 2/10 in IR  Dr Havery Moros note today:  Preference per pt  is provocative angiography if it is being offered per IR. IR has also requested a general surgery consult for backup plan should the patient have severe bleeding during provocative angiography that they are unable to stop with embolization. Plan is for Meckel's scan tomorrow, followed by provocative angiography if Meckel's is negative. Clear liquid diet okay now, NPO after MN, trend Hgb, transfuse additional blood if needed.   Plan for review of scans tomorrow with Dr Earleen Newport Will plan for arteriogram if needed.   Past Medical History:  Diagnosis Date  . ALLERGIC RHINITIS 10/24/2007  . HYPERLIPIDEMIA 10/16/2010  . HYPERTENSION 07/25/2007    Past Surgical History:  Procedure  Laterality Date  . APPENDECTOMY    . BIOPSY  02/01/2021   Procedure: BIOPSY;  Surgeon: Rush Landmark Telford Nab., MD;  Location: [place];  Service: Gastroenterology;;  . CLAVICLE SURGERY    . COLONOSCOPY WITH PROPOFOL N/A 02/01/2021   Procedure: COLONOSCOPY WITH PROPOFOL;  Surgeon: Rush Landmark Telford Nab., MD;  Location: [place];  Service: Gastroenterology;  Laterality: N/A;  . ESOPHAGOGASTRODUODENOSCOPY (EGD) WITH PROPOFOL N/A 02/01/2021   Procedure: ESOPHAGOGASTRODUODENOSCOPY (EGD) WITH PROPOFOL;  Surgeon: Rush Landmark Telford Nab., MD;  Location: [place];  Service: Gastroenterology;  Laterality: N/A;  . GIVENS CAPSULE STUDY N/A 02/03/2021   Procedure: GIVENS CAPSULE STUDY;  Surgeon: Yetta Flock, MD;  Location: [place];  Service: Gastroenterology;  Laterality: N/A;  . NASAL SEPTUM SURGERY     septal deviation, turbinate hypertrophy and obstruction    Allergies: Doxycycline  Medications: Prior to Admission medications   Medication Sig Start Date End Date Taking? Authorizing Provider  aspirin EC 325 MG tablet Take 325 mg by mouth every other day.   Yes [provider]  benazepril (LOTENSIN) 20 MG tablet TAKE 1 TABLET BY MOUTH EVERY DAY Patient taking differently: Take 20 mg by mouth daily. 12/09/20  Yes Billie Ruddy, MD  Brimonidine Tartrate 0.025 % SOLN Place 1-2 drops into both eyes 3 (three) times daily as needed (to reduce redness/irritation).    Yes [provider]  Dextran 70-Hypromellose (ARTIFICIAL TEARS PF OP) Place 2 drops into both eyes every morning.   Yes [provider]  fexofenadine (ALLEGRA) 180 MG tablet Take 180 mg by mouth daily as needed for allergies or rhinitis.    Yes [provider]  spironolactone (ALDACTONE) 25 MG tablet TAKE 1 TABLET BY MOUTH EVERY DAY Patient taking differently: Take 25 mg by mouth daily. 01/07/21  Yes Billie Ruddy, MD  VIAGRA 50 MG tablet TAKE 1 TABLET BY MOUTH AT BEDTIME AS  NEEDED Patient taking differently: Take 50 mg by mouth daily as needed for erectile dysfunction. 08/10/16   Marletta Lor, MD     Family History  Problem Relation Age of Onset  . Hypertension Brother   . Diabetes Brother     Social History   Socioeconomic History  . Marital status: Single    Spouse name: Not on file  . Number of children: Not on file  . Years of education: Not on file  . Highest education level: Not on file  Occupational History  . Not on file  Tobacco Use  . Smoking status: Never Smoker  . Smokeless tobacco: Never Used  Substance and Sexual Activity  . Alcohol use: Yes  . Drug use: No  . Sexual activity: Not on file  Other Topics Concern  . Not on file  Social History Narrative  . Not on file   Social Determinants of Health   Financial Resource Strain: Not on file  Food Insecurity: Not on file  Transportation Needs: Not on file  Physical Activity: Not on file  Stress: Not on file  Social Connections: Not on file    Review of Systems: A 12 point ROS discussed and pertinent positives are indicated in the HPI above.  All other systems are negative.  Review of Systems  Constitutional: Positive for activity change.  Respiratory: Negative for cough and shortness of breath.   Gastrointestinal: Positive for blood in stool.  Psychiatric/Behavioral: Negative for behavioral problems and confusion.    Vital Signs: BP 113/74 (BP Location: Right Arm)   Pulse 90   Temp 98.3 F (36.8 C) (Oral)   Resp 14   Ht 5\' 11"  (1.803 m)   Wt 173 lb 4.5 oz (78.6 kg)   SpO2 100%   BMI 24.17 kg/m   Physical Exam Vitals reviewed.  HENT:     Mouth/Throat:     Mouth: Mucous membranes are moist.  Cardiovascular:     Rate and Rhythm: Normal rate and regular rhythm.     Heart sounds: Normal heart sounds.  Pulmonary:     Effort: Pulmonary effort is normal.     Breath sounds: Normal breath sounds.  Abdominal:     Palpations: Abdomen is soft.      Tenderness: There is no abdominal tenderness.  Musculoskeletal:        General: Normal range of motion.  Skin:    General: Skin is warm.  Neurological:     Mental Status: He is alert and oriented to person, place, and time.  Psychiatric:        Behavior: Behavior normal.     Imaging: DG Chest 1 View  Result Date: 02/01/2021 CLINICAL DATA:  Central line placement. EXAM: CHEST  1 VIEW COMPARISON:  February 12, 2019. FINDINGS: The heart size and mediastinal contours are within normal limits. Both lungs are clear. No pneumothorax or pleural effusion is noted. 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 The visualized skeletal structures are unremarkable. IMPRESSION: Venous sheath is seen in right internal jugular vein with distal tip in expected position of the SVC. Electronically Signed   By: Marijo Conception M.D.   On: 02/01/2021 15:28  NM GI Blood Loss  Result Date: 02/01/2021 CLINICAL DATA:  Episode of syncope following bloody bowel movement. EXAM: NUCLEAR MEDICINE GASTROINTESTINAL BLEEDING SCAN TECHNIQUE: Sequential abdominal images were obtained following intravenous administration of Tc-55m labeled red blood cells. RADIOPHARMACEUTICALS:  22 mCi Tc-70m pertechnetate in-vitro labeled red cells. COMPARISON:  CT abdomen pelvis January 31, 2021 and January 29, 2021. FINDINGS: Normal distribution radiotracer. No extraluminal activity to suggest the presence of active gastrointestinal bleed. IMPRESSION: No extraluminal radiotracer activity to suggest the presence of active gastrointestinal bleed. Electronically Signed   By: Dahlia Bailiff MD   On: 02/01/2021 20:44   ECHOCARDIOGRAM COMPLETE  Result Date: 02/04/2021    ECHOCARDIOGRAM REPORT   Patient Name:   Jay Robertson Date of Exam: 02/04/2021 Medical Rec #:  [id number]   Height:       71.0 in Accession #:    [id number]  Weight:       175.0 lb Date of Birth:  1956/09/24    BSA:          1.992 m  Patient Age:    32 years    BP:           134/81 mmHg Patient Gender: M           HR:           80 bpm. Exam Location:  Inpatient Procedure: 2D Echo Indications:    murmur  History:        Patient has no prior history of Echocardiogram examinations.                 Signs/Symptoms:Syncope; Risk Factors:Hypertension.  Sonographer:    Johny Chess Referring Phys: [id number] LAURA P CLARK  Sonographer Comments: Image acquisition challenging due to respiratory motion. IMPRESSIONS  1. Left ventricular ejection fraction, by estimation, is 60 to 65%. The left ventricle has normal function. The left ventricle has no regional wall motion abnormalities. There is mild left ventricular hypertrophy. Left ventricular diastolic parameters are consistent with Grade I diastolic dysfunction (impaired relaxation).  2. Right ventricular systolic function is normal. The right ventricular size is normal.  3. The mitral valve is normal in structure. No evidence of mitral valve regurgitation. No evidence of mitral stenosis.  4. The aortic valve has an indeterminant number of cusps. Aortic valve regurgitation is not visualized. No aortic stenosis is present.  5. The inferior vena cava is normal in size with greater than 50% respiratory variability, suggesting right atrial pressure of 3 mmHg. FINDINGS  Left Ventricle: Left ventricular ejection fraction, by estimation, is 60 to 65%. The left ventricle has normal function. The left ventricle has no regional wall motion abnormalities. The left ventricular internal cavity size was normal in size. There is  mild left ventricular hypertrophy. Left ventricular diastolic parameters are consistent with Grade I diastolic dysfunction (impaired relaxation). Right Ventricle: The right ventricular size is normal. Right ventricular systolic function is normal. Left Atrium: Left atrial size was normal in size. Right Atrium: Right atrial size was normal in size. Pericardium: Trivial pericardial effusion is  present. Mitral Valve: The mitral valve is normal in structure. No evidence of mitral valve regurgitation. No evidence of mitral valve stenosis. Tricuspid Valve: The tricuspid valve is normal in structure. Tricuspid valve regurgitation is trivial. No evidence of tricuspid stenosis. Aortic Valve: The aortic valve has an indeterminant number of cusps. Aortic valve regurgitation is not visualized. No aortic stenosis is present. Pulmonic Valve: The pulmonic valve was normal in structure. Pulmonic valve regurgitation  is not visualized. No evidence of pulmonic stenosis. Aorta: The aortic root is normal in size and structure. Venous: The inferior vena cava is normal in size with greater than 50% respiratory variability, suggesting right atrial pressure of 3 mmHg. IAS/Shunts: No atrial level shunt detected by color flow Doppler.  LEFT VENTRICLE PLAX 2D LVIDd:         3.90 cm  Diastology LVIDs:         2.40 cm  LV e' medial:    8.16 cm/s LV PW:         1.10 cm  LV E/e' medial:  10.6 LV IVS:        1.40 cm  LV e' lateral:   11.00 cm/s LVOT diam:     2.10 cm  LV E/e' lateral: 7.8 LV SV:         90 LV SV Index:   45 LVOT Area:     3.46 cm  RIGHT VENTRICLE             IVC RV S prime:     15.90 cm/s  IVC diam: 1.20 cm TAPSE (M-mode): 2.1 cm LEFT ATRIUM             Index       RIGHT ATRIUM           Index LA diam:        3.20 cm 1.61 cm/m  RA Area:     12.20 cm LA Vol (A2C):   35.9 ml 18.02 ml/m RA Volume:   28.90 ml  14.50 ml/m LA Vol (A4C):   38.8 ml 19.47 ml/m LA Biplane Vol: 38.4 ml 19.27 ml/m  AORTIC VALVE LVOT Vmax:   144.00 cm/s LVOT Vmean:  94.700 cm/s LVOT VTI:    0.259 m  AORTA Ao Root diam: 3.10 cm Ao Asc diam:  3.20 cm MITRAL VALVE MV Area (PHT): 3.99 cm    SHUNTS MV Decel Time: 190 msec    Systemic VTI:  0.26 m MV E velocity: 86.30 cm/s  Systemic Diam: 2.10 cm MV A velocity: 83.40 cm/s MV E/A ratio:  1.03 Kirk Ruths MD Electronically signed by Kirk Ruths MD Signature Date/Time: 02/04/2021/4:13:26 PM     Final    [place] LT UPPER EXTREM LTD SOFT TISSUE NON VASCULAR  Result Date: 02/03/2021 CLINICAL DATA:  Erythema edema in the left AC EXAM: ULTRASOUND left UPPER EXTREMITY LIMITED TECHNIQUE: Ultrasound examination of the upper extremity soft tissues was performed in the area of clinical concern. COMPARISON:  None. FINDINGS: Within the area of interest within the left antecubital fossa there is a superficial cephalic venous thrombus which is partially occlusive extending from the antecubital fossa to the mid upper arm. No loculated fluid collections or soft tissue mass is seen. IMPRESSION: Superficial thrombophlebitis within the cephalic vein within the area of interest. No loculated fluid collections. Electronically Signed   By: Prudencio Pair M.D.   On: 02/03/2021 22:58   CT Angio Abd/Pel w/ and/or w/o  Result Date: 02/05/2021 CLINICAL DATA:  65 year old with GI bleeding. Evaluate for bleeding source. EXAM: CTA ABDOMEN AND PELVIS WITHOUT AND WITH CONTRAST TECHNIQUE: Multidetector CT imaging of the abdomen and pelvis was performed using the standard protocol during bolus administration of intravenous contrast. Multiplanar reconstructed images and MIPs were obtained and reviewed to evaluate the vascular anatomy. CONTRAST:  17mL OMNIPAQUE IOHEXOL 350 MG/ML SOLN COMPARISON:  CT 08/20/2021 FINDINGS: VASCULAR Aorta: Mild atherosclerotic disease without stenosis, aneurysm or dissection. Celiac: Mild narrowing of  the proximal celiac trunk likely related to median arcuate ligament compression. Main branch vessels are patent. Variant anatomy with the right hepatic artery originating near the origin of the common hepatic artery. There is also a replaced left hepatic artery. SMA: Patent without evidence of aneurysm, dissection, vasculitis or significant stenosis. Prominent pancreatic-duodenal branches but no evidence for active bleeding or pseudoaneurysm formation. Renals: Single right renal artery is widely patent without  aneurysm, dissection or FMD. Main left renal artery is patent without aneurysm, dissection or FMD. There is a small accessory left renal artery. IMA: Patent Inflow: Patent without evidence of aneurysm, dissection, vasculitis or significant stenosis. Mild atherosclerotic disease in the right iliac arteries. Proximal Outflow: Proximal femoral arteries are patent bilaterally. Veins: Portal venous system is patent. Hepatic veins are patent. IVC and renal veins are patent. Bilateral iliac veins are patent. Visualized proximal femoral veins are patent. Review of the MIP images confirms the above findings. NON-VASCULAR Lower chest: Few patchy densities in the left lower lobe are unchanged and probably represent postinflammatory changes. Otherwise, lung bases are clear. No pleural effusions. Hepatobiliary: Normal appearance of the liver and gallbladder. No biliary dilatation. No evidence for a hepatic lesion. Pancreas: Unremarkable. No pancreatic ductal dilatation or surrounding inflammatory changes. Spleen: Normal in size without focal abnormality. Adrenals/Urinary Tract: Normal appearance of the adrenal glands. Evidence for extrarenal pelvis bilaterally. Small exophytic low-density along the posterior left kidney is suggestive for a cyst based on the Hounsfield units. No suspicious renal lesion. Normal appearance of the urinary bladder. Stomach/Bowel: The endoscopy capsule is located in the cecum. No evidence for active GI bleeding. No acute bowel inflammation. Mild distention of the distal stomach and proximal duodenal region. Small diverticula involving the right colon. Small diverticula in the proximal sigmoid colon region Lymphatic: No lymph node enlargement in the abdomen or pelvis. Reproductive: Prostate is mildly prominent measuring 4.6 cm in the transverse dimension. Other: Negative for ascites. Negative for free air. Tiny ventral hernia containing fat. Musculoskeletal: No acute bone abnormality. IMPRESSION:  VASCULAR 1. No evidence for active GI bleeding. 2. Mild atherosclerotic disease. NON-VASCULAR 1. No acute abnormality in the abdomen or pelvis. 2. Endoscopy capsule is located in the cecum. 3. Persistent small densities in left lung lower lobe that could represent postinflammatory changes. Electronically Signed   By: Markus Daft M.D.   On: 02/05/2021 14:12   CT Angio Abd/Pel w/ and/or w/o  Result Date: 01/31/2021 CLINICAL DATA:  Bright red GI bleeding EXAM: CTA ABDOMEN AND PELVIS WITHOUT AND WITH CONTRAST TECHNIQUE: Multidetector CT imaging of the abdomen and pelvis was performed using the standard protocol during bolus administration of intravenous contrast. Multiplanar reconstructed images and MIPs were obtained and reviewed to evaluate the vascular anatomy. CONTRAST:  122mL OMNIPAQUE IOHEXOL 350 MG/ML SOLN COMPARISON:  CTA abdomen and pelvis dated 01/29/2021 FINDINGS: VASCULAR Aorta: Normal caliber aorta without aneurysm, dissection, vasculitis or significant stenosis. Trace atherosclerotic calcifications along the abdominal aorta. Celiac: Patent without evidence of aneurysm, dissection, vasculitis or significant stenosis. The left hepatic artery is replaced to the left gastric artery. SMA: Patent without evidence of aneurysm, dissection, vasculitis or significant stenosis. Renals: Both renal arteries are patent without evidence of aneurysm, dissection, vasculitis, fibromuscular dysplasia or significant stenosis. IMA: Patent without evidence of aneurysm, dissection, vasculitis or significant stenosis. Inflow: Patent without evidence of aneurysm, dissection, vasculitis or significant stenosis. Proximal Outflow: Bilateral common femoral and visualized portions of the superficial and profunda femoral arteries are patent without evidence of aneurysm, dissection, vasculitis or  significant stenosis. Veins: No focal venous abnormality. Review of the MIP images confirms the above findings. NON-VASCULAR Lower chest: No  acute abnormality. Hepatobiliary: No focal liver abnormality is seen. No gallstones, gallbladder wall thickening, or biliary dilatation. High attenuation material within the gallbladder lumen favored to reflect vicarious excretion of contrast material. Pancreas: Unremarkable. No pancreatic ductal dilatation or surrounding inflammatory changes. Spleen: Normal in size without focal abnormality. Adrenals/Urinary Tract: Adrenal glands are unremarkable. Kidneys are normal, without renal calculi, focal l solid lesion, or hydronephrosis. Bladder is unremarkable. Small simple cyst exophytic from the lower pole of the left kidney. Stomach/Bowel: Stomach is within normal limits. Appendix appears normal. No evidence of bowel wall thickening, distention, or inflammatory changes. There are a few scattered colonic diverticula but no evidence of active diverticulitis. Lymphatic: No suspicious lymphadenopathy. Reproductive: Prostate is unremarkable. Other: No abdominal wall hernia or abnormality. No abdominopelvic ascites. Musculoskeletal: No acute or significant osseous findings. Focal L5-S1 degenerative disc disease. IMPRESSION: 1. No evidence of active GI bleed at this time. 2. Mild colonic diverticulosis without evidence of diverticulitis. Electronically Signed   By: Jacqulynn Cadet M.D.   On: 01/31/2021 15:09   CT Angio Abd/Pel W and/or Wo Contrast  Result Date: 01/29/2021 CLINICAL DATA:  65 year old male with GI bleed. EXAM: CTA ABDOMEN AND PELVIS WITHOUT AND WITH CONTRAST TECHNIQUE: Multidetector CT imaging of the abdomen and pelvis was performed using the standard protocol during bolus administration of intravenous contrast. Multiplanar reconstructed images and MIPs were obtained and reviewed to evaluate the vascular anatomy. CONTRAST:  128mL OMNIPAQUE IOHEXOL 350 MG/ML SOLN COMPARISON:  None. FINDINGS: VASCULAR Aorta: Mild atherosclerotic calcification. No aneurysmal dilatation or dissection. No periaortic fluid  collection. Celiac: The celiac axis and its major branches are patent. There is a replaced left hepatic artery from left gastric artery. SMA: Patent without evidence of aneurysm, dissection, vasculitis or significant stenosis. Renals: Both renal arteries are patent without evidence of aneurysm, dissection, vasculitis, fibromuscular dysplasia or significant stenosis. IMA: Patent without evidence of aneurysm, dissection, vasculitis or significant stenosis. Inflow: Mild atherosclerotic calcification. No aneurysmal dilatation or dissection. The iliac arteries are patent. Proximal Outflow: Bilateral common femoral and visualized portions of the superficial and profunda femoral arteries are patent without evidence of aneurysm, dissection, vasculitis or significant stenosis. Veins: The IVC is unremarkable. The SMV, splenic vein, and main portal vein are patent. No portal venous gas. Review of the MIP images confirms the above findings. NON-VASCULAR Lower chest: The visualized lung bases are clear. No intra-abdominal free air or free fluid. Hepatobiliary: No focal liver abnormality is seen. No gallstones, gallbladder wall thickening, or biliary dilatation. Pancreas: Unremarkable. No pancreatic ductal dilatation or surrounding inflammatory changes. Spleen: Normal in size without focal abnormality. Adrenals/Urinary Tract: The adrenal glands unremarkable. High density content within the renal collecting system on precontrast study may represent residual contrast from prior administration. However, a 3 mm nonobstructing left renal inferior pole calculus may be present. There is no hydronephrosis on either side. There is symmetric enhancement of the kidneys. A 1 cm left renal interpolar cyst. The visualized ureters appear unremarkable. The urinary bladder is collapsed. Stomach/Bowel: There is loose stool throughout the colon compatible with diarrheal state. There is sigmoid diverticulosis and scattered colonic diverticula  without active inflammatory changes. There is segmental thickening of the proximal transverse colon which may be related to underdistention. There is however apparent soft tissue nodularity at the distal portion of the thickened segment (coronal 38/19) and therefore a mass is not excluded. Further evaluation  with colonoscopy after resolution of acute symptoms recommended. There is overall diffuse increase in the attenuation of the colonic content post contrast which may be related to hyperemia suggestive of possible mild colitis. No obvious focal contrast extravasation identified. There is no bowel obstruction. Appendectomy. Lymphatic: No adenopathy. Reproductive: The prostate and seminal vesicles are grossly unremarkable. Other: None Musculoskeletal: Degenerative changes with disc desiccation at L5-S1. No acute osseous pathology. IMPRESSION: 1. No definite CT evidence of active GI bleed. 2. Diarrheal state with possible mild colitis. No bowel obstruction. 3. Colonic diverticulosis. 4. Segmental thickening of the proximal transverse colon may be related to underdistention. A colonic mass is not excluded. Further evaluation with colonoscopy after resolution of acute symptoms recommended. Electronically Signed   By: Anner Crete M.D.   On: 01/29/2021 18:22    Labs:  CBC: Recent Labs    01/29/21 1239 01/29/21 2225 01/30/21 1456 01/31/21 0528 02/03/21 0321 02/03/21 1559 02/04/21 1330 02/04/21 1912 02/05/21 0332 02/05/21 0926  WBC 7.9  --  8.2  --  8.8  --   --   --   --  8.1  HGB 11.8*   < > 8.6*   < > 6.5*   < > 7.7* 6.9* 9.3* 8.2*  HCT 33.6*   < > 23.9*   < > 19.2*   < > 22.5* 20.7* 25.9* 25.0*  PLT 219  --  179  --  127*  --   --   --   --  154   < > = values in this interval not displayed.    COAGS: Recent Labs    02/04/21 1932  INR 1.3*  APTT 33    BMP: Recent Labs    01/30/21 1456 02/02/21 0644 02/03/21 0613 02/04/21 1912  NA 137 138 137 135  K 3.9 3.8 3.7 3.6  CL 109  109 107 107  CO2 20* 21* 23 19*  GLUCOSE 125* 84 83 115*  BUN 17 14 11 12   CALCIUM 7.8* 7.5* 7.2* 7.7*  CREATININE 0.97 0.98 0.80 0.91  GFRNONAA >60 >60 >60 >60    LIVER FUNCTION TESTS: Recent Labs    01/29/21 1239  BILITOT 0.8  AST 22  ALT 28  ALKPHOS 42  PROT 6.3*  ALBUMIN 3.5    TUMOR MARKERS: No results for input(s): AFPTM, CEA, CA199, CHROMGRNA in the last 8760 hours.  Assessment and Plan:  GI Bleed No source evident on studies thus far. Large bloody BM this am-- vagal and hypotensive-- rapid response New CTA still not revealing source Planned for possible mesenteric arteriogram with embolization in IR 2/10  Risks and benefits of mesenteric arteriogram with embolization were discussed with the patient including, but not limited to bleeding, infection, vascular injury or contrast induced renal failure.  This interventional procedure involves the use of X-rays and because of the nature of the planned procedure, it is possible that we will have prolonged use of X-ray fluoroscopy.  Potential radiation risks to you include (but are not limited to) the following: - A slightly elevated risk for cancer  several years later in life. This risk is typically less than 0.5% percent. This risk is low in comparison to the normal incidence of human cancer, which is 33% for women and 50% for men according to the [place]. - Radiation induced injury can include skin redness, resembling a rash, tissue breakdown / ulcers and hair loss (which can be temporary or permanent).   The likelihood of either of these  occurring depends on the difficulty of the procedure and whether you are sensitive to radiation due to previous procedures, disease, or genetic conditions.   IF your procedure requires a prolonged use of radiation, you will be notified and given written instructions for further action.  It is your responsibility to monitor the irradiated area for the 2 weeks following  the procedure and to notify your physician if you are concerned that you have suffered a radiation induced injury.    All of the patient's questions were answered, patient is agreeable to proceed.  Consent signed and in chart.  Thank you for this interesting consult.  I greatly enjoyed meeting Jay Robertson and look forward to participating in their care.  A copy of this report was sent to the requesting provider on this date.  Electronically Signed: Lavonia Drafts, PA-C 02/05/2021, 3:49 PM   I spent a total of 40 Minutes    in face to face in clinical consultation, greater than 50% of which was counseling/coordinating care for mesenteric arteriogram with possible embolization

## 2021-02-05 NOTE — Consult Note (Signed)
Consult Note  Jay Robertson May 23, 1956  [id number].    Requesting MD: Mason Neck Cellar, MD Chief Complaint/Reason for Consult: GI bleeding  HPI:  Patient is a 65 year old male who presented to [facility] 01/29/21 for evaluation of syncope following a bloody BM. Patient reported on day of admission, he had a dark stool that was immediately followed by bright red blood mixed with stool and then continued bleeding from the rectum. He became lightheaded and passed out twice, EMS was called and he was brought in. Patient denied previous episodes of GI bleeding. He normally takes 325 mg ASA daily but had not for 3 days PTA. Denied recent NSAID use. GI was consulted and patient has undergone extensive workup for GI bleeding including EGD, colonoscopy, tagged RBC scan,  CTA x 2 and VCE. Fresh blood was noted in cecum on VCE and bleeding is mostly likely felt to be originating in right colon, however specific source has not been localized with 762% certainty. GI is planning Meckel's scan and if that is negative IR evaluation for possible angiography/embolization. General surgery asked to consult in case all other interventions unsuccessful and patient continues to have GI bleeding.  PMH significant for HTN, HLD, GERD, erectile dysfunction, mild OSA. Past abdominal surgery includes appendectomy. Patient denies tobacco or illicit drug use. Patient reports occasional alcohol use. Patient works for the [place] [employer].   ROS:  Review of Systems  Constitutional: Negative for chills and fever.  Respiratory: Negative for shortness of breath and wheezing.   Cardiovascular: Negative for chest pain and palpitations.  Gastrointestinal: Positive for blood in stool and melena. Negative for abdominal pain, nausea and vomiting.  Genitourinary: Negative for dysuria, frequency and urgency.  Neurological: Positive for dizziness, loss of consciousness and weakness.  All other systems reviewed and are negative.   Family  History  Problem Relation Age of Onset  . Hypertension Brother   . Diabetes Brother     Past Medical History:  Diagnosis Date  . ALLERGIC RHINITIS 10/24/2007  . HYPERLIPIDEMIA 10/16/2010  . HYPERTENSION 07/25/2007    Past Surgical History:  Procedure Laterality Date  . APPENDECTOMY    . BIOPSY  02/01/2021   Procedure: BIOPSY;  Surgeon: Rush Landmark Telford Nab., MD;  Location: [place];  Service: Gastroenterology;;  . CLAVICLE SURGERY    . COLONOSCOPY WITH PROPOFOL N/A 02/01/2021   Procedure: COLONOSCOPY WITH PROPOFOL;  Surgeon: Rush Landmark Telford Nab., MD;  Location: [place];  Service: Gastroenterology;  Laterality: N/A;  . ESOPHAGOGASTRODUODENOSCOPY (EGD) WITH PROPOFOL N/A 02/01/2021   Procedure: ESOPHAGOGASTRODUODENOSCOPY (EGD) WITH PROPOFOL;  Surgeon: Rush Landmark Telford Nab., MD;  Location: [place];  Service: Gastroenterology;  Laterality: N/A;  . GIVENS CAPSULE STUDY N/A 02/03/2021   Procedure: GIVENS CAPSULE STUDY;  Surgeon: Yetta Flock, MD;  Location: [place];  Service: Gastroenterology;  Laterality: N/A;  . NASAL SEPTUM SURGERY     septal deviation, turbinate hypertrophy and obstruction    Social History:  reports that he has never smoked. He has never used smokeless tobacco. He reports current alcohol use. He reports that he does not use drugs.  Allergies:  Allergies  Allergen Reactions  . Doxycycline Other (See Comments)    Headaches    Medications Prior to Admission  Medication Sig Dispense Refill  . aspirin EC 325 MG tablet Take 325 mg by mouth every other day.    . benazepril (LOTENSIN) 20 MG tablet TAKE 1 TABLET BY MOUTH EVERY DAY (Patient taking differently: Take  20 mg by mouth daily.) 90 tablet 3  . Brimonidine Tartrate 0.025 % SOLN Place 1-2 drops into both eyes 3 (three) times daily as needed (to reduce redness/irritation).     . Dextran 70-Hypromellose (ARTIFICIAL TEARS PF OP) Place 2 drops into both eyes every morning.    . fexofenadine  (ALLEGRA) 180 MG tablet Take 180 mg by mouth daily as needed for allergies or rhinitis.     Marland Kitchen spironolactone (ALDACTONE) 25 MG tablet TAKE 1 TABLET BY MOUTH EVERY DAY (Patient taking differently: Take 25 mg by mouth daily.) 90 tablet 0  . VIAGRA 50 MG tablet TAKE 1 TABLET BY MOUTH AT BEDTIME AS NEEDED (Patient taking differently: Take 50 mg by mouth daily as needed for erectile dysfunction.) 11 tablet 1    Blood pressure 113/74, pulse 90, temperature 98.3 F (36.8 C), temperature source Oral, resp. rate 14, height 5\' 11"  (1.803 m), weight 78.6 kg, SpO2 100 %. Physical Exam:  General: pleasant, WD, WN male who is laying in bed in NAD HEENT: head is normocephalic, atraumatic.  Sclera are noninjected.  PERRL.  Ears and nose without any masses or lesions.  Mouth is pink and moist Heart: regular, rate, and rhythm.  Normal s1,s2. No obvious murmurs, gallops, or rubs noted.  Palpable pedal pulses bilaterally Lungs: CTAB, no wheezes, rhonchi, or rales noted.  Respiratory effort nonlabored Abd: Soft, NT, ND, +BS, no masses, hernias, or organomegaly. Prior open appendectomy scar well healed. MS: all 4 extremities are symmetrical with no cyanosis, clubbing, or edema. Skin: warm and dry with no masses, lesions, or rashes Neuro: Cranial nerves 2-12 grossly intact, sensation is normal throughout, moves all extremities.  Psych: A&Ox3 with an appropriate affect.  Results for orders placed or performed during the hospital encounter of 01/29/21 (from the past 48 hour(s))  Hemoglobin and hematocrit, blood     Status: Abnormal   Collection Time: 02/03/21  9:23 PM  Result Value Ref Range   Hemoglobin 7.4 (L) 13.0 - 17.0 g/dL   HCT 22.6 (L) 39.0 - 52.0 %    Comment: Performed at [facility], [address], [place], [place] [address]  Hemoglobin and hematocrit, blood     Status: Abnormal   Collection Time: 02/04/21  5:21 AM  Result Value Ref Range   Hemoglobin 7.2 (L) 13.0 - 17.0 g/dL   HCT 22.0 (L)  39.0 - 52.0 %    Comment: Performed at [facility] [address], [place], [place] [address]  Hemoglobin and hematocrit, blood     Status: Abnormal   Collection Time: 02/04/21  1:30 PM  Result Value Ref Range   Hemoglobin 7.7 (L) 13.0 - 17.0 g/dL   HCT 22.5 (L) 39.0 - 52.0 %    Comment: Performed at [facility], [place] [address] [place], [place] [address]  Culture, blood (routine x 2)     Status: None (Preliminary result)   Collection Time: 02/04/21  3:08 PM   Specimen: BLOOD  Result Value Ref Range   Specimen Description BLOOD RIGHT ANTECUBITAL    Special Requests      BOTTLES DRAWN AEROBIC AND ANAEROBIC Blood Culture adequate volume   Culture      NO GROWTH < 24 HOURS Performed at [facility], [place] [address], [place], [place] [address]    Report Status PENDING   Culture, blood (routine x 2)     Status: None (Preliminary result)   Collection Time: 02/04/21  3:09  PM   Specimen: BLOOD  Result Value Ref Range   Specimen Description BLOOD RIGHT ANTECUBITAL    Special Requests      BOTTLES DRAWN AEROBIC AND ANAEROBIC Blood Culture adequate volume   Culture  Setup Time      GRAM POSITIVE COCCI IN CLUSTERS AEROBIC BOTTLE ONLY Organism ID to follow Performed at [facility], [place] [address], [place], [place] [address]    Culture GRAM POSITIVE COCCI IN CLUSTERS    Report Status PENDING   Glucose, capillary     Status: None   Collection Time: 02/04/21  7:06 PM  Result Value Ref Range   Glucose-Capillary 96 70 - 99 mg/dL    Comment: Glucose reference range applies only to samples taken after fasting for at least 8 hours.  Hemoglobin and hematocrit, blood     Status: Abnormal   Collection Time: 02/04/21  7:12 PM  Result Value Ref Range   Hemoglobin 6.9 (LL) 13.0 - 17.0 g/dL    Comment: REPEATED TO VERIFY THIS CRITICAL RESULT HAS VERIFIED AND BEEN CALLED TO T.BULLOCK RN BY KATHERINE [facility] ON 02 08 2022 AT 2003, AND HAS BEEN READ BACK.     HCT 20.7 (L)  39.0 - 52.0 %    Comment: Performed at [facility], [place] [address], [place], [place] [address]  Basic metabolic panel     Status: Abnormal   Collection Time: 02/04/21  7:12 PM  Result Value Ref Range   Sodium 135 135 - 145 mmol/L   Potassium 3.6 3.5 - 5.1 mmol/L   Chloride 107 98 - 111 mmol/L   CO2 19 (L) 22 - 32 mmol/L   Glucose, Bld 115 (H) 70 - 99 mg/dL    Comment: Glucose reference range applies only to samples taken after fasting for at least 8 hours.   BUN 12 8 - 23 mg/dL   Creatinine, Ser 0.91 0.61 - 1.24 mg/dL   Calcium 7.7 (L) 8.9 - 10.3 mg/dL   GFR, Estimated >60 >60 mL/min    Comment: (NOTE) Calculated using the CKD-EPI Creatinine Equation (2021)    Anion gap 9 5 - 15    Comment: Performed at [facility] [address], [place], [place] [address]  Prepare RBC (crossmatch)     Status: None   Collection Time: 02/04/21  7:32 PM  Result Value Ref Range   Order Confirmation      ORDER PROCESSED BY BLOOD BANK Performed at [facility], [place] [address] [place], [place] [address]   Protime-INR     Status: Abnormal   Collection Time: 02/04/21  7:32 PM  Result Value Ref Range   Prothrombin Time 16.1 (H) 11.4 - 15.2 seconds   INR 1.3 (H) 0.8 - 1.2    Comment: (NOTE) INR goal varies based on device and disease states. Performed at [facility], [place] [address], [place], [place] [address]   Fibrinogen     Status: None   Collection Time: 02/04/21  7:32 PM  Result Value Ref Range   Fibrinogen 468 210 - 475 mg/dL    Comment: Performed at [facility] [address] [place], [place] [address]  APTT     Status: None   Collection Time: 02/04/21  7:32 PM  Result Value Ref Range   aPTT 33 24 - 36 seconds    Comment: Performed at [facility] [address] [place], [place] [address]  Hemoglobin and  hematocrit, blood     Status: Abnormal   Collection Time: 02/05/21  3:32 AM  Result Value Ref Range   Hemoglobin 9.3 (L) 13.0 - 17.0 g/dL     Comment: REPEATED TO VERIFY POST TRANSFUSION SPECIMEN    HCT 25.9 (L) 39.0 - 52.0 %    Comment: Performed at [facility] [address], [place], [place] [address]  CBC     Status: Abnormal   Collection Time: 02/05/21  9:26 AM  Result Value Ref Range   WBC 8.1 4.0 - 10.5 K/uL   RBC 2.79 (L) 4.22 - 5.81 MIL/uL   Hemoglobin 8.2 (L) 13.0 - 17.0 g/dL   HCT 25.0 (L) 39.0 - 52.0 %   MCV 89.6 80.0 - 100.0 fL   MCH 29.4 26.0 - 34.0 pg   MCHC 32.8 30.0 - 36.0 g/dL   RDW 17.1 (H) 11.5 - 15.5 %   Platelets 154 150 - 400 K/uL   nRBC 0.2 0.0 - 0.2 %    Comment: Performed at [facility], [place] [address] [place], [place] [address]   ECHOCARDIOGRAM COMPLETE  Result Date: 02/04/2021    ECHOCARDIOGRAM REPORT   Patient Name:   SANCHEZ HEMMER Date of Exam: 02/04/2021 Medical Rec #:  [id number]   Height:       71.0 in Accession #:    [id number]  Weight:       175.0 lb Date of Birth:  [birth year]    BSA:          1.992 m Patient Age:    28 years    BP:           134/81 mmHg Patient Gender: M           HR:           80 bpm. Exam Location:  Inpatient Procedure: 2D Echo Indications:    murmur  History:        Patient has no prior history of Echocardiogram examinations.                 Signs/Symptoms:Syncope; Risk Factors:Hypertension.  Sonographer:    Johny Chess Referring Phys: [id number] LAURA P CLARK  Sonographer Comments: Image acquisition challenging due to respiratory motion. IMPRESSIONS  1. Left ventricular ejection fraction, by estimation, is 60 to 65%. The left ventricle has normal function. The left ventricle has no regional wall motion abnormalities. There is mild left ventricular hypertrophy. Left ventricular diastolic parameters are consistent with Grade I diastolic dysfunction (impaired relaxation).  2. Right ventricular systolic function is normal. The right ventricular size is normal.  3. The mitral valve is normal in structure. No evidence of mitral valve regurgitation. No evidence of  mitral stenosis.  4. The aortic valve has an indeterminant number of cusps. Aortic valve regurgitation is not visualized. No aortic stenosis is present.  5. The inferior vena cava is normal in size with greater than 50% respiratory variability, suggesting right atrial pressure of 3 mmHg. FINDINGS  Left Ventricle: Left ventricular ejection fraction, by estimation, is 60 to 65%. The left ventricle has normal function. The left ventricle has no regional wall motion abnormalities. The left ventricular internal cavity size was normal in size. There is  mild left ventricular hypertrophy. Left ventricular diastolic parameters are consistent with Grade I diastolic dysfunction (impaired relaxation). Right Ventricle: The right ventricular size is normal. Right ventricular systolic function is normal. Left Atrium: Left atrial size was normal in size. Right Atrium: Right  atrial size was normal in size. Pericardium: Trivial pericardial effusion is present. Mitral Valve: The mitral valve is normal in structure. No evidence of mitral valve regurgitation. No evidence of mitral valve stenosis. Tricuspid Valve: The tricuspid valve is normal in structure. Tricuspid valve regurgitation is trivial. No evidence of tricuspid stenosis. Aortic Valve: The aortic valve has an indeterminant number of cusps. Aortic valve regurgitation is not visualized. No aortic stenosis is present. Pulmonic Valve: The pulmonic valve was normal in structure. Pulmonic valve regurgitation is not visualized. No evidence of pulmonic stenosis. Aorta: The aortic root is normal in size and structure. Venous: The inferior vena cava is normal in size with greater than 50% respiratory variability, suggesting right atrial pressure of 3 mmHg. IAS/Shunts: No atrial level shunt detected by color flow Doppler.  LEFT VENTRICLE PLAX 2D LVIDd:         3.90 cm  Diastology LVIDs:         2.40 cm  LV e' medial:    8.16 cm/s LV PW:         1.10 cm  LV E/e' medial:  10.6 LV IVS:         1.40 cm  LV e' lateral:   11.00 cm/s LVOT diam:     2.10 cm  LV E/e' lateral: 7.8 LV SV:         90 LV SV Index:   45 LVOT Area:     3.46 cm  RIGHT VENTRICLE             IVC RV S prime:     15.90 cm/s  IVC diam: 1.20 cm TAPSE (M-mode): 2.1 cm LEFT ATRIUM             Index       RIGHT ATRIUM           Index LA diam:        3.20 cm 1.61 cm/m  RA Area:     12.20 cm LA Vol (A2C):   35.9 ml 18.02 ml/m RA Volume:   28.90 ml  14.50 ml/m LA Vol (A4C):   38.8 ml 19.47 ml/m LA Biplane Vol: 38.4 ml 19.27 ml/m  AORTIC VALVE LVOT Vmax:   144.00 cm/s LVOT Vmean:  94.700 cm/s LVOT VTI:    0.259 m  AORTA Ao Root diam: 3.10 cm Ao Asc diam:  3.20 cm MITRAL VALVE MV Area (PHT): 3.99 cm    SHUNTS MV Decel Time: 190 msec    Systemic VTI:  0.26 m MV E velocity: 86.30 cm/s  Systemic Diam: 2.10 cm MV A velocity: 83.40 cm/s MV E/A ratio:  1.03 Kirk Ruths MD Electronically signed by Kirk Ruths MD Signature Date/Time: 02/04/2021/4:13:26 PM    Final    [place] LT UPPER EXTREM LTD SOFT TISSUE NON VASCULAR  Result Date: 02/03/2021 CLINICAL DATA:  Erythema edema in the left AC EXAM: ULTRASOUND left UPPER EXTREMITY LIMITED TECHNIQUE: Ultrasound examination of the upper extremity soft tissues was performed in the area of clinical concern. COMPARISON:  None. FINDINGS: Within the area of interest within the left antecubital fossa there is a superficial cephalic venous thrombus which is partially occlusive extending from the antecubital fossa to the mid upper arm. No loculated fluid collections or soft tissue mass is seen. IMPRESSION: Superficial thrombophlebitis within the cephalic vein within the area of interest. No loculated fluid collections. Electronically Signed   By: Prudencio Pair M.D.   On: 02/03/2021 22:58   CT Angio Abd/Pel w/ and/or w/o  Result Date: 02/05/2021 CLINICAL DATA:  65 year old with GI bleeding. Evaluate for bleeding source. EXAM: CTA ABDOMEN AND PELVIS WITHOUT AND WITH CONTRAST TECHNIQUE: Multidetector CT  imaging of the abdomen and pelvis was performed using the standard protocol during bolus administration of intravenous contrast. Multiplanar reconstructed images and MIPs were obtained and reviewed to evaluate the vascular anatomy. CONTRAST:  112mL OMNIPAQUE IOHEXOL 350 MG/ML SOLN COMPARISON:  CT 08/20/2021 FINDINGS: VASCULAR Aorta: Mild atherosclerotic disease without stenosis, aneurysm or dissection. 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 Main branch vessels are patent. Variant anatomy with the right hepatic artery originating near the origin of the common hepatic artery. There is also a replaced left hepatic artery. SMA: Patent without evidence of aneurysm, dissection, vasculitis or significant stenosis. Prominent pancreatic-duodenal branches but no evidence for active bleeding or pseudoaneurysm formation. Renals: Single right renal artery is widely patent without aneurysm, dissection or FMD. Main left renal artery is patent without aneurysm, dissection or FMD. There is a small accessory left renal artery. IMA: Patent Inflow: Patent without evidence of aneurysm, dissection, vasculitis or significant stenosis. Mild atherosclerotic disease in the right iliac arteries. Proximal Outflow: Proximal femoral arteries are patent bilaterally. Veins: Portal venous system is patent. Hepatic veins are patent. IVC and renal veins are patent. Bilateral iliac veins are patent. Visualized proximal femoral veins are patent. Review of the MIP images confirms the above findings. NON-VASCULAR Lower chest: Few patchy densities in the left lower lobe are unchanged and probably represent postinflammatory changes. Otherwise, lung bases are clear. No pleural effusions. Hepatobiliary: Normal appearance of the liver and gallbladder. No biliary dilatation. No evidence for a hepatic lesion. Pancreas: Unremarkable. No pancreatic ductal dilatation or surrounding inflammatory  changes. Spleen: Normal in size without focal abnormality. Adrenals/Urinary Tract: Normal appearance of the adrenal glands. Evidence for extrarenal pelvis bilaterally. Small exophytic low-density along the posterior left kidney is suggestive for a cyst based on the Hounsfield units. No suspicious renal lesion. Normal appearance of the urinary bladder. Stomach/Bowel: The endoscopy capsule is located in the cecum. No evidence for active GI bleeding. No acute bowel inflammation. Mild distention of the distal stomach and proximal duodenal region. Small diverticula involving the right colon. Small diverticula in the proximal sigmoid colon region Lymphatic: No lymph node enlargement in the abdomen or pelvis. Reproductive: Prostate is mildly prominent measuring 4.6 cm in the transverse dimension. Other: Negative for ascites. Negative for free air. Tiny ventral hernia containing fat. Musculoskeletal: No acute bone abnormality. IMPRESSION: VASCULAR 1. No evidence for active GI bleeding. 2. Mild atherosclerotic disease. NON-VASCULAR 1. No acute abnormality in the abdomen or pelvis. 2. Endoscopy capsule is located in the cecum. 3. Persistent small densities in left lung lower lobe that could represent postinflammatory changes. Electronically Signed   By: Markus Daft M.D.   On: 02/05/2021 14:12   Anti-infectives (From admission, onward)   Start     Dose/Rate Route Frequency Ordered Stop   02/05/21 0800  vancomycin (VANCOREADY) IVPB 1250 mg/250 mL        1,250 mg 166.7 mL/hr over 90 Minutes Intravenous Every 12 hours 02/04/21 1650     02/04/21 1745  vancomycin (VANCOREADY) IVPB 1500 mg/300 mL        1,500 mg 150 mL/hr over 120 Minutes Intravenous  Once 02/04/21 1650 02/04/21 1944   02/03/21 2200  cephALEXin (KEFLEX) capsule 500 mg  Status:  Discontinued        500 mg Oral Every 8 hours  02/03/21 1846 02/04/21 1559      Assessment/Plan HTN HLD Mild OSA GERD Erectile dysfunction  Thrombophlebitis - vanc per  primary team   GI Bleeding  - painless hematochezia requiring multiple transfusions - EGD 2/5 with some non-bleeding gastric ulcers - colonoscopy 2/5 with scattered diverticula throughout, polyp at the hepatic flexure, internal and external hemorrhoids, no active source of bleeding  - CTA and tagged RBC scan have not localized bleeding - VCE with fresh blood in cecum  - GI planning Meckel's scan to rule out that as a source of GI bleeding - if meckel's scan negative - IR to evaluate for provocative angiography with possible embolization - surgery asked to see for potential consideration of R hemicolectomy if all other interventions unsuccessful, GI feels that R colon diverticular bleeding is the most likely source. Dr. Bobbye Morton has discussed risks of possible surgery. We will be available if needed.  FEN: CLD. NPO at midnight.  VTE: no chemical prophylaxis in setting of GIB ID: Vanc 2/8>>  Alferd Apa, [facility] Surgery 02/05/2021, 3:59 PM Please see Amion for pager number during day hours 7:00am-4:30pm

## 2021-02-05 NOTE — Progress Notes (Signed)
CRITICAL VALUE ALERT  Critical Value:  Hemoglobin = 6.9  Date & Time Notied:  02/04/2021 2003  Provider Notified: Dr. Oletta Darter  Orders Received/Actions taken: No new orders given at this time.  Patient already has orders for blood to be transfused.

## 2021-02-05 NOTE — Progress Notes (Addendum)
   NAME:  ELSTON ALDAPE, MRN:  [id number], DOB:  [birth year], LOS: 6 ADMISSION DATE:  01/29/2021, CONSULTATION DATE:  02/05/21 REFERRING MD:  TRH CHIEF COMPLAINT:  hematochezia  Brief History:  65 year old admitted with bright red blood per rectum with intermittent hematochezia whom we are consulted for ongoing large-volume hematochezia.   Past Medical History:  Hypertension  Significant Hospital Events:  CTA angio abdomen pelvis 2/2 was negative CTA angio abdomen pelvis 2/4 was negative Colonoscopy and upper endoscopy completed 02/01/2021 showed no evidence of bleed in the upper, no evidence of bleed with exam of distal ileum on colonoscopy, blood throughout the large  intestine with bilateral diverticulosis 2/6Tagged scan unrevealing. 2/8 syncope with acute lower GI bleed, hemoglobin dropped from 7.7-6.9, 1 unit PRBC transfused   Consults:  PCCM GI VIR  Procedures:  EGD and colonoscopy 02/01/2021  Significant Diagnostic Tests:  CT abdomen pelvis angiogram 2/2, 2/4 both negative Echo 2/8 nml :V fn  Micro Data:  H. pylori pending from biopsy on endoscopy  Antimicrobials:  N/A  Interim History / Subjective:   Developed episode of syncope with large bloody bowel movement yesterday evening, hemoglobin dropped from 7.7-6.9, 1 unit PRBC transfused  Objective   Blood pressure 113/74, pulse 90, temperature 98.1 F (36.7 C), temperature source Oral, resp. rate 14, height 5\' 11"  (1.803 m), weight 78.6 kg, SpO2 100 %.        Intake/Output Summary (Last 24 hours) at 02/05/2021 1215 Last data filed at 02/05/2021 3500 Gross per 24 hour  Intake 870 ml  Output 1200 ml  Net -330 ml   Filed Weights   02/03/21 0500 02/03/21 0857 02/05/21 0030  Weight: 79.4 kg 79.4 kg 78.6 kg    Examination:  General elderly black man, sitting up in bed, no distress able to converse, wife at bedside HENT NCAT no JVD Pulm clear breath sounds bilateral Card RRR abd soft, nontender, no guarding Ext warm  and dry  Neuro alert, interactive  Resolved Hospital Problem list   Hemorrhagic shock   Assessment & Plan:  GIB : GI thinks stuttering Diverticular bleed right colon is etiology of bleeding. Capsule study 2/7 .  He had another episode of syncope 2/8 indicating ongoing bleeding  Plan Per GI, they are recommending embolization by IR.  IR has requested CT angiogram Surgery is a last resort  Syncope -no primary cardiac abnormality, related to GI bleeding and blood loss  Left arm AC vein phlebitis Plan IV vancomycin with plan to transition to linezolid per ID  PCCM available as needed  Labs show hemoglobin improved from 6.9-9.3 and has dropped back down to 8.2   Best practice (evaluated daily)  Diet: clears  Pain/Anxiety/Delirium protocol (if indicated): n/a VAP protocol (if indicated): n/a DVT prophylaxis: chemoprophylaxis contraindicaed in setting of GI bleed GI prophylaxis: PPI Glucose control: n/a Mobility: bed rest for now Disposition: Telemetry  Kara Mead MD. Shade Flood. [facility] Pager : [phone]  If no response to pager , please call [phone] until 7 pm After 7:00 pm call Elink  [phone]   02/05/2021

## 2021-02-06 ENCOUNTER — Inpatient Hospital Stay (HOSPITAL_COMMUNITY): Payer: Medicare Other

## 2021-02-06 ENCOUNTER — Telehealth: Payer: Self-pay | Admitting: Gastroenterology

## 2021-02-06 DIAGNOSIS — K5731 Diverticulosis of large intestine without perforation or abscess with bleeding: Secondary | ICD-10-CM

## 2021-02-06 DIAGNOSIS — K922 Gastrointestinal hemorrhage, unspecified: Secondary | ICD-10-CM | POA: Diagnosis not present

## 2021-02-06 DIAGNOSIS — I808 Phlebitis and thrombophlebitis of other sites: Secondary | ICD-10-CM | POA: Diagnosis not present

## 2021-02-06 LAB — BASIC METABOLIC PANEL
Anion gap: 9 (ref 5–15)
BUN: 10 mg/dL (ref 8–23)
CO2: 22 mmol/L (ref 22–32)
Calcium: 8.1 mg/dL — ABNORMAL LOW (ref 8.9–10.3)
Chloride: 105 mmol/L (ref 98–111)
Creatinine, Ser: 0.96 mg/dL (ref 0.61–1.24)
GFR, Estimated: >60 mL/min (ref >60)
Glucose, Bld: 92 mg/dL (ref 70–99)
Potassium: 3.9 mmol/L (ref 3.5–5.1)
Sodium: 136 mmol/L (ref 135–145)

## 2021-02-06 LAB — CULTURE, BLOOD (ROUTINE X 2): Special Requests: ADEQUATE

## 2021-02-06 LAB — SURGICAL PATHOLOGY

## 2021-02-06 LAB — CBC
HCT: 21.4 % — ABNORMAL LOW (ref 39.0–52.0)
Hemoglobin: 7.4 g/dL — ABNORMAL LOW (ref 13.0–17.0)
MCH: 30.1 pg (ref 26.0–34.0)
MCHC: 34.6 g/dL (ref 30.0–36.0)
MCV: 87 fL (ref 80.0–100.0)
Platelets: 166 10*3/uL (ref 150–400)
RBC: 2.46 MIL/uL — ABNORMAL LOW (ref 4.22–5.81)
RDW: 17.2 % — ABNORMAL HIGH (ref 11.5–15.5)
WBC: 6.9 10*3/uL (ref 4.0–10.5)
nRBC: 0 % (ref 0.0–0.2)

## 2021-02-06 LAB — HEMOGLOBIN AND HEMATOCRIT, BLOOD
HCT: 21.6 % — ABNORMAL LOW (ref 39.0–52.0)
Hemoglobin: 7.5 g/dL — ABNORMAL LOW (ref 13.0–17.0)

## 2021-02-06 NOTE — Progress Notes (Signed)
[place] for Infectious Disease  Date of Admission:  01/29/2021   Total days of antibiotics 4        Day 3 vancomycin         ASSESSMENT: Superficial thrombophlebitis  PLAN: 1. Superficial thrombophlebitis- continues to remain afebrile, with no signs of infection. On exam, decreased erythema. 1/4 BC showed staphylococcus. Will continue to monitor [facility] and continue vancomycin while he is in the hospital. Will consider short course of oral linezolid at discharge pending cultures.   Principal Problem:   Acute lower GI bleeding Active Problems:   Essential hypertension   Syncope   GI bleed   Diverticulosis of colon with hemorrhage   Scheduled Meds: . pantoprazole  40 mg Oral BID AC   Continuous Infusions: . sodium chloride 10 mL/hr at 02/05/21 2119  . vancomycin 1,250 mg (02/06/21 0905)   PRN Meds:.acetaminophen **OR** acetaminophen, ALPRAZolam, ondansetron **OR** ondansetron (ZOFRAN) IV   SUBJECTIVE: Patient reports feeling well this morning. He denies any fevers, night sweats, chills. States his arm feels fine. He is waiting to see if he has any more GI bleeds before proceeding with further interventions.  Review of Systems: Review of Systems  Constitutional: Negative for chills, diaphoresis and fever.  Respiratory: Negative for shortness of breath.   Cardiovascular: Negative for chest pain and leg swelling.  Gastrointestinal: Negative for abdominal pain, nausea and vomiting.  Musculoskeletal: Negative for joint pain.  Skin: Negative for itching and rash.  Neurological: Negative for weakness and headaches.    Allergies  Allergen Reactions  . Doxycycline Other (See Comments)    Headaches    OBJECTIVE: Vitals:   02/05/21 2343 02/06/21 0335 02/06/21 0833 02/06/21 1049  BP:  120/72 127/78 116/74  Pulse:  86 80 79  Resp: 17 20 20 16   Temp:  97.9 F (36.6 C) 98.3 F (36.8 C) 98.1 F (36.7 C)  TempSrc:  Oral Oral Oral  SpO2: 99% 99% 100% 99%   Weight:  76.4 kg    Height:       Body mass index is 23.5 kg/m.  Physical Exam Constitutional:      General: He is not in acute distress.    Appearance: Normal appearance. He is not ill-appearing.  HENT:     Head: Normocephalic and atraumatic.  Eyes:     Extraocular Movements: Extraocular movements intact.     Conjunctiva/sclera: Conjunctivae normal.  Cardiovascular:     Rate and Rhythm: Normal rate and regular rhythm.     Heart sounds: Normal heart sounds. No murmur heard. No friction rub. No gallop.   Pulmonary:     Effort: Pulmonary effort is normal. No respiratory distress.     Breath sounds: Normal breath sounds. No wheezing, rhonchi or rales.  Abdominal:     General: Abdomen is flat. Bowel sounds are normal. There is no distension.     Palpations: Abdomen is soft.     Tenderness: There is no abdominal tenderness. There is no guarding.  Musculoskeletal:        General: No swelling or tenderness.     Right lower leg: No edema.     Left lower leg: No edema.  Skin:    General: Skin is warm and dry.     Findings: No erythema or rash.     Comments: Decreased erythema at [facility] site of superficial venous thrombus, still firm to touch  Neurological:     Mental Status: He is alert and  oriented to person, place, and time.  Psychiatric:        Mood and Affect: Mood normal.        Behavior: Behavior normal.        Thought Content: Thought content normal.        Judgment: Judgment normal.     Lab Results Lab Results  Component Value Date   WBC 6.9 02/06/2021   HGB 7.4 (L) 02/06/2021   HCT 21.4 (L) 02/06/2021   MCV 87.0 02/06/2021   PLT 166 02/06/2021    Lab Results  Component Value Date   CREATININE 0.96 02/06/2021   BUN 10 02/06/2021   NA 136 02/06/2021   K 3.9 02/06/2021   CL 105 02/06/2021   CO2 22 02/06/2021    Lab Results  Component Value Date   ALT 28 01/29/2021   AST 22 01/29/2021   ALKPHOS 42 01/29/2021   BILITOT 0.8 01/29/2021      Microbiology: Recent Results (from the past 240 hour(s))  SARS CORONAVIRUS 2 (TAT 6-24 HRS) Nasopharyngeal Nasopharyngeal Swab     Status: None   Collection Time: 01/29/21  8:00 PM   Specimen: Nasopharyngeal Swab  Result Value Ref Range Status   SARS Coronavirus 2 NEGATIVE NEGATIVE Final    Comment: (NOTE) SARS-CoV-2 target nucleic acids are NOT DETECTED.  The SARS-CoV-2 RNA is generally detectable in upper and lower respiratory specimens during the acute phase of infection. Negative results do not preclude SARS-CoV-2 infection, do not rule out co-infections with other pathogens, and should not be used as the sole basis for treatment or other patient management decisions. Negative results must be combined with clinical observations, patient history, and epidemiological information. The expected result is Negative.  Fact Sheet for Patients: SugarRoll.be  Fact Sheet for Healthcare Providers: https://www.woods-mathews.com/  This test is not yet approved or cleared by the [place] FDA and  has been authorized for detection and/or diagnosis of SARS-CoV-2 by FDA under an Emergency Use Authorization (EUA). This EUA will remain  in effect (meaning this test can be used) for the duration of the COVID-19 declaration under Se ction 564(b)(1) of the Act, 21 U.S.C. section 360bbb-3(b)(1), unless the authorization is terminated or revoked sooner.  Performed at [facility], [place] [address], [place], [place] [address]   Culture, blood (routine x 2)     Status: None (Preliminary result)   Collection Time: 02/04/21  3:08 PM   Specimen: BLOOD  Result Value Ref Range Status   Specimen Description BLOOD RIGHT ANTECUBITAL  Final   Special Requests   Final    BOTTLES DRAWN AEROBIC AND ANAEROBIC Blood Culture adequate volume   Culture   Final    NO GROWTH 2 DAYS Performed at [facility], [place] [address] [place], [place] [address]     Report Status PENDING  Incomplete  Culture, blood (routine x 2)     Status: None (Preliminary result)   Collection Time: 02/04/21  3:09 PM   Specimen: BLOOD  Result Value Ref Range Status   Specimen Description BLOOD RIGHT ANTECUBITAL  Final   Special Requests   Final    BOTTLES DRAWN AEROBIC AND ANAEROBIC Blood Culture adequate volume   Culture  Setup Time   Final    GRAM POSITIVE COCCI IN CLUSTERS AEROBIC BOTTLE ONLY CRITICAL RESULT CALLED TO, READ BACK BY AND VERIFIED WITHLanae Boast PHARMD 1751 02/05/21 A BROWNING Performed at [facility], [place] [address], [place],  [address]  Culture GRAM POSITIVE COCCI IN CLUSTERS  Final   Report Status PENDING  Incomplete  Blood Culture ID Panel (Reflexed)     Status: Abnormal   Collection Time: 02/04/21  3:09 PM  Result Value Ref Range Status   Enterococcus faecalis NOT DETECTED NOT DETECTED Final   Enterococcus Faecium NOT DETECTED NOT DETECTED Final   Listeria monocytogenes NOT DETECTED NOT DETECTED Final   Staphylococcus species DETECTED (A) NOT DETECTED Final    Comment: CRITICAL RESULT CALLED TO, READ BACK BY AND VERIFIED WITH: Lanae Boast PHARMD 1612 02/05/21 A BROWNING    Staphylococcus aureus (BCID) NOT DETECTED NOT DETECTED Final   Staphylococcus epidermidis NOT DETECTED NOT DETECTED Final   Staphylococcus lugdunensis NOT DETECTED NOT DETECTED Final   Streptococcus species NOT DETECTED NOT DETECTED Final   Streptococcus agalactiae NOT DETECTED NOT DETECTED Final   Streptococcus pneumoniae NOT DETECTED NOT DETECTED Final   Streptococcus pyogenes NOT DETECTED NOT DETECTED Final   A.calcoaceticus-baumannii NOT DETECTED NOT DETECTED Final   Bacteroides fragilis NOT DETECTED NOT DETECTED Final   Enterobacterales NOT DETECTED NOT DETECTED Final   Enterobacter cloacae complex NOT DETECTED NOT DETECTED Final   Escherichia coli NOT DETECTED NOT DETECTED Final   Klebsiella aerogenes NOT DETECTED NOT DETECTED Final   Klebsiella  oxytoca NOT DETECTED NOT DETECTED Final   Klebsiella pneumoniae NOT DETECTED NOT DETECTED Final   Proteus species NOT DETECTED NOT DETECTED Final   Salmonella species NOT DETECTED NOT DETECTED Final   Serratia marcescens NOT DETECTED NOT DETECTED Final   Haemophilus influenzae NOT DETECTED NOT DETECTED Final   Neisseria meningitidis NOT DETECTED NOT DETECTED Final   Pseudomonas aeruginosa NOT DETECTED NOT DETECTED Final   Stenotrophomonas maltophilia NOT DETECTED NOT DETECTED Final   Candida albicans NOT DETECTED NOT DETECTED Final   Candida auris NOT DETECTED NOT DETECTED Final   Candida glabrata NOT DETECTED NOT DETECTED Final   Candida krusei NOT DETECTED NOT DETECTED Final   Candida parapsilosis NOT DETECTED NOT DETECTED Final   Candida tropicalis NOT DETECTED NOT DETECTED Final   Cryptococcus neoformans/gattii NOT DETECTED NOT DETECTED Final    Comment: Performed at [facility], [address] [place], [place] [address]    Areeg N Rehman, DO PGY-2 [place] [facility] 02/06/2021, 11:01 AM

## 2021-02-06 NOTE — Telephone Encounter (Signed)
Jay Robertson from [facility] is requesting to speak with a nurse regarding this pt's health, caller did not disclose any further information.

## 2021-02-06 NOTE — Telephone Encounter (Signed)
Spoke with Jay Robertson, she stated that she was calling for a diet order from Dr. Havery Moros but he has already ordered it. She had no further concerns.

## 2021-02-06 NOTE — Progress Notes (Signed)
Progress Note   Subjective  Patient denies bleeding overnight, last episode 1140 AM yesterday. On liquid diet, wants to eat. Declining Meckel's study this AM as well as provocative angio after further thought about it last night. He wants more time for observation prior to additional testing. He denies any pain.   Objective   Vital signs in last 24 hours: Temp:  [97.9 F (36.6 C)-98.5 F (36.9 C)] 97.9 F (36.6 C) (02/10 0335) Pulse Rate:  [86] 86 (02/10 0335) Resp:  [14-21] 20 (02/10 0335) BP: (107-144)/(68-87) 120/72 (02/10 0335) SpO2:  [91 %-100 %] 99 % (02/10 0335) Weight:  [76.4 kg] 76.4 kg (02/10 0335) Last BM Date: 02/05/21 General:    AA male in NAD Abdomen:  Soft, nontender and nondistended.  Extremities:  Without edema. Neurologic:  Alert and oriented,  grossly normal neurologically. Psych:  Cooperative. Normal mood and affect.  Intake/Output from previous day: 02/09 0701 - 02/10 0700 In: 1590.1 [P.O.:1340; IV Piggyback:250.1] Out: 2975 [Urine:2975] Intake/Output this shift: No intake/output data recorded.  Lab Results: Recent Labs    02/05/21 0926 02/05/21 1901 02/06/21 0307  WBC 8.1  --  6.9  HGB 8.2* 7.6* 7.4*  HCT 25.0* 21.7* 21.4*  PLT 154  --  166   BMET Recent Labs    02/04/21 1912 02/06/21 0307  NA 135 136  K 3.6 3.9  CL 107 105  CO2 19* 22  GLUCOSE 115* 92  BUN 12 10  CREATININE 0.91 0.96  CALCIUM 7.7* 8.1*   LFT No results for input(s): PROT, ALBUMIN, AST, ALT, ALKPHOS, BILITOT, BILIDIR, IBILI in the last 72 hours. PT/INR Recent Labs    02/04/21 1932  LABPROT 16.1*  INR 1.3*    Studies/Results: ECHOCARDIOGRAM COMPLETE  Result Date: 02/04/2021    ECHOCARDIOGRAM REPORT   Patient Name:   Jay Robertson Date of Exam: 02/04/2021 Medical Rec #:  [id number]   Height:       71.0 in Accession #:    [id number]  Weight:       175.0 lb Date of Birth:  Dec 23, 1956    BSA:          1.992 m Patient Age:    65 years    BP:           134/81 mmHg  Patient Gender: M           HR:           80 bpm. Exam Location:  Inpatient Procedure: 2D Echo Indications:    murmur  History:        Patient has no prior history of Echocardiogram examinations.                 Signs/Symptoms:Syncope; Risk Factors:Hypertension.  Sonographer:    Johny Chess Referring Phys: [id number] LAURA P CLARK  Sonographer Comments: Image acquisition challenging due to respiratory motion. IMPRESSIONS  1. Left ventricular ejection fraction, by estimation, is 60 to 65%. The left ventricle has normal function. The left ventricle has no regional wall motion abnormalities. There is mild left ventricular hypertrophy. Left ventricular diastolic parameters are consistent with Grade I diastolic dysfunction (impaired relaxation).  2. Right ventricular systolic function is normal. The right ventricular size is normal.  3. The mitral valve is normal in structure. No evidence of mitral valve regurgitation. No evidence of mitral stenosis.  4. The aortic valve has an indeterminant number of cusps. Aortic valve regurgitation is not visualized. No aortic stenosis is  present.  5. The inferior vena cava is normal in size with greater than 50% respiratory variability, suggesting right atrial pressure of 3 mmHg. FINDINGS  Left Ventricle: Left ventricular ejection fraction, by estimation, is 60 to 65%. The left ventricle has normal function. The left ventricle has no regional wall motion abnormalities. The left ventricular internal cavity size was normal in size. There is  mild left ventricular hypertrophy. Left ventricular diastolic parameters are consistent with Grade I diastolic dysfunction (impaired relaxation). Right Ventricle: The right ventricular size is normal. Right ventricular systolic function is normal. Left Atrium: Left atrial size was normal in size. Right Atrium: Right atrial size was normal in size. Pericardium: Trivial pericardial effusion is present. Mitral Valve: The mitral valve is normal in  structure. No evidence of mitral valve regurgitation. No evidence of mitral valve stenosis. Tricuspid Valve: The tricuspid valve is normal in structure. Tricuspid valve regurgitation is trivial. No evidence of tricuspid stenosis. Aortic Valve: The aortic valve has an indeterminant number of cusps. Aortic valve regurgitation is not visualized. No aortic stenosis is present. Pulmonic Valve: The pulmonic valve was normal in structure. Pulmonic valve regurgitation is not visualized. No evidence of pulmonic stenosis. Aorta: The aortic root is normal in size and structure. Venous: The inferior vena cava is normal in size with greater than 50% respiratory variability, suggesting right atrial pressure of 3 mmHg. IAS/Shunts: No atrial level shunt detected by color flow Doppler.  LEFT VENTRICLE PLAX 2D LVIDd:         3.90 cm  Diastology LVIDs:         2.40 cm  LV e' medial:    8.16 cm/s LV PW:         1.10 cm  LV E/e' medial:  10.6 LV IVS:        1.40 cm  LV e' lateral:   11.00 cm/s LVOT diam:     2.10 cm  LV E/e' lateral: 7.8 LV SV:         90 LV SV Index:   45 LVOT Area:     3.46 cm  RIGHT VENTRICLE             IVC RV S prime:     15.90 cm/s  IVC diam: 1.20 cm TAPSE (M-mode): 2.1 cm LEFT ATRIUM             Index       RIGHT ATRIUM           Index LA diam:        3.20 cm 1.61 cm/m  RA Area:     12.20 cm LA Vol (A2C):   35.9 ml 18.02 ml/m RA Volume:   28.90 ml  14.50 ml/m LA Vol (A4C):   38.8 ml 19.47 ml/m LA Biplane Vol: 38.4 ml 19.27 ml/m  AORTIC VALVE LVOT Vmax:   144.00 cm/s LVOT Vmean:  94.700 cm/s LVOT VTI:    0.259 m  AORTA Ao Root diam: 3.10 cm Ao Asc diam:  3.20 cm MITRAL VALVE MV Area (PHT): 3.99 cm    SHUNTS MV Decel Time: 190 msec    Systemic VTI:  0.26 m MV E velocity: 86.30 cm/s  Systemic Diam: 2.10 cm MV A velocity: 83.40 cm/s MV E/A ratio:  1.03 Kirk Ruths MD Electronically signed by Kirk Ruths MD Signature Date/Time: 02/04/2021/4:13:26 PM    Final    CT Angio Abd/Pel w/ and/or w/o  Result  Date: 02/05/2021 CLINICAL DATA:  65 year old with GI bleeding. Evaluate  for bleeding source. EXAM: CTA ABDOMEN AND PELVIS WITHOUT AND WITH CONTRAST TECHNIQUE: Multidetector CT imaging of the abdomen and pelvis was performed using the standard protocol during bolus administration of intravenous contrast. Multiplanar reconstructed images and MIPs were obtained and reviewed to evaluate the vascular anatomy. CONTRAST:  138mL OMNIPAQUE IOHEXOL 350 MG/ML SOLN COMPARISON:  CT 08/20/2021 FINDINGS: VASCULAR Aorta: Mild atherosclerotic disease without stenosis, aneurysm or dissection. 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 Main branch vessels are patent. Variant anatomy with the right hepatic artery originating near the origin of the common hepatic artery. There is also a replaced left hepatic artery. SMA: Patent without evidence of aneurysm, dissection, vasculitis or significant stenosis. Prominent pancreatic-duodenal branches but no evidence for active bleeding or pseudoaneurysm formation. Renals: Single right renal artery is widely patent without aneurysm, dissection or FMD. Main left renal artery is patent without aneurysm, dissection or FMD. There is a small accessory left renal artery. IMA: Patent Inflow: Patent without evidence of aneurysm, dissection, vasculitis or significant stenosis. Mild atherosclerotic disease in the right iliac arteries. Proximal Outflow: Proximal femoral arteries are patent bilaterally. Veins: Portal venous system is patent. Hepatic veins are patent. IVC and renal veins are patent. Bilateral iliac veins are patent. Visualized proximal femoral veins are patent. Review of the MIP images confirms the above findings. NON-VASCULAR Lower chest: Few patchy densities in the left lower lobe are unchanged and probably represent postinflammatory changes. Otherwise, lung bases are clear. No pleural effusions. Hepatobiliary: Normal  appearance of the liver and gallbladder. No biliary dilatation. No evidence for a hepatic lesion. Pancreas: Unremarkable. No pancreatic ductal dilatation or surrounding inflammatory changes. Spleen: Normal in size without focal abnormality. Adrenals/Urinary Tract: Normal appearance of the adrenal glands. Evidence for extrarenal pelvis bilaterally. Small exophytic low-density along the posterior left kidney is suggestive for a cyst based on the Hounsfield units. No suspicious renal lesion. Normal appearance of the urinary bladder. Stomach/Bowel: The endoscopy capsule is located in the cecum. No evidence for active GI bleeding. No acute bowel inflammation. Mild distention of the distal stomach and proximal duodenal region. Small diverticula involving the right colon. Small diverticula in the proximal sigmoid colon region Lymphatic: No lymph node enlargement in the abdomen or pelvis. Reproductive: Prostate is mildly prominent measuring 4.6 cm in the transverse dimension. Other: Negative for ascites. Negative for free air. Tiny ventral hernia containing fat. Musculoskeletal: No acute bone abnormality. IMPRESSION: VASCULAR 1. No evidence for active GI bleeding. 2. Mild atherosclerotic disease. NON-VASCULAR 1. No acute abnormality in the abdomen or pelvis. 2. Endoscopy capsule is located in the cecum. 3. Persistent small densities in left lung lower lobe that could represent postinflammatory changes. Electronically Signed   By: Markus Daft M.D.   On: 02/05/2021 14:12       Assessment / Plan:    65 y/o male with suspected right sided colonic diverticular bleeding. S/p extensive workup now with EGD, colonoscopy, capsule endoscopy, tagged RBC scan, CTA x 3. Capsule remained in cecum on CTA yesterday, so blood noted on capsule study is right colon and appeared fresh, likely due to diverticulum in that area. He had recurrent bleeding last yesterday AM. We had discussed options yesterday at length. IR and general surgery  consulted. We had plans for Meckel scan this AM followed by provocative angio with surgical backup if needed to try and identify source and embolize if amenable. He has thought about this overnight and given he has done well since  yesterday AM he wishes to have more time and see if he can resolve this on his own. Given his course recently, he is at risk for recurrent bleeding and understands this. We discussed Meckel scan, which there is not much risk and I think reasonable to ensure that is not present, but he is declining this today as well. He wants to advance his diet and be monitored for another day and see how things go. If he has recurrent bleeding, please contact us. Very difficult situation and he is obviously frustrated by his prolonged hospital stay, hopefully he improves / resolves this with him but if not he will reconsider the additional workup that has been offered to him. Appreciate IR and surgical consultation.  Call with questions.  Stallings Cellar, MD [facility]

## 2021-02-06 NOTE — Progress Notes (Signed)
General Surgery Follow Up Note  Subjective:    Overnight Issues:   Objective:  Vital signs for last 24 hours: Temp:  [97.9 F (36.6 C)-98.5 F (36.9 C)] 97.9 F (36.6 C) (02/10 0335) Pulse Rate:  [86] 86 (02/10 0335) Resp:  [14-21] 20 (02/10 0335) BP: (107-144)/(68-87) 120/72 (02/10 0335) SpO2:  [91 %-100 %] 99 % (02/10 0335) Weight:  [76.4 kg] 76.4 kg (02/10 0335)  Hemodynamic parameters for last 24 hours:    Intake/Output from previous day: 02/09 0701 - 02/10 0700 In: 1590.1 [P.O.:1340; IV Piggyback:250.1] Out: 2975 [Urine:2975]  Intake/Output this shift: No intake/output data recorded.  Vent settings for last 24 hours:    Physical Exam:  Gen: comfortable, no distress Neuro: non-focal exam HEENT: PERRL Neck: supple CV: RRR Pulm: unlabored breathing Abd: soft, NT GU: clear yellow urine Extr: wwp, no edema   Results for orders placed or performed during the hospital encounter of 01/29/21 (from the past 24 hour(s))  CBC     Status: Abnormal   Collection Time: 02/05/21  9:26 AM  Result Value Ref Range   WBC 8.1 4.0 - 10.5 K/uL   RBC 2.79 (L) 4.22 - 5.81 MIL/uL   Hemoglobin 8.2 (L) 13.0 - 17.0 g/dL   HCT 25.0 (L) 39.0 - 52.0 %   MCV 89.6 80.0 - 100.0 fL   MCH 29.4 26.0 - 34.0 pg   MCHC 32.8 30.0 - 36.0 g/dL   RDW 17.1 (H) 11.5 - 15.5 %   Platelets 154 150 - 400 K/uL   nRBC 0.2 0.0 - 0.2 %  Hemoglobin and hematocrit, blood     Status: Abnormal   Collection Time: 02/05/21  7:01 PM  Result Value Ref Range   Hemoglobin 7.6 (L) 13.0 - 17.0 g/dL   HCT 21.7 (L) 39.0 - 39.7 %  Basic metabolic panel     Status: Abnormal   Collection Time: 02/06/21  3:07 AM  Result Value Ref Range   Sodium 136 135 - 145 mmol/L   Potassium 3.9 3.5 - 5.1 mmol/L   Chloride 105 98 - 111 mmol/L   CO2 22 22 - 32 mmol/L   Glucose, Bld 92 70 - 99 mg/dL   BUN 10 8 - 23 mg/dL   Creatinine, Ser 0.96 0.61 - 1.24 mg/dL   Calcium 8.1 (L) 8.9 - 10.3 mg/dL   GFR, Estimated >60 >60  mL/min   Anion gap 9 5 - 15  CBC     Status: Abnormal   Collection Time: 02/06/21  3:07 AM  Result Value Ref Range   WBC 6.9 4.0 - 10.5 K/uL   RBC 2.46 (L) 4.22 - 5.81 MIL/uL   Hemoglobin 7.4 (L) 13.0 - 17.0 g/dL   HCT 21.4 (L) 39.0 - 52.0 %   MCV 87.0 80.0 - 100.0 fL   MCH 30.1 26.0 - 34.0 pg   MCHC 34.6 30.0 - 36.0 g/dL   RDW 17.2 (H) 11.5 - 15.5 %   Platelets 166 150 - 400 K/uL   nRBC 0.0 0.0 - 0.2 %    Assessment & Plan:  Present on Admission: . Acute lower GI bleeding . Essential hypertension . Syncope . GI bleed    LOS: 7 days   Additional comments:I reviewed the patient's new clinical lab test results.   and I reviewed the patients new imaging test results.    HTN HLD Mild OSA GERD Erectile dysfunction  Thrombophlebitis - vanc per primary team   GI Bleeding  -  painless hematochezia requiring multiple transfusions - EGD 2/5 with some non-bleeding gastric ulcers - colonoscopy 2/5 with scattered diverticula throughout, polyp at the hepatic flexure, internal and external hemorrhoids, no active source of bleeding  - CTA and tagged RBC scan have not localized bleeding - VCE with fresh blood in cecum  - GI was planning Meckel's scan to rule out that as a source of GI bleeding, but patient is declining additional imaging - if meckel's scan negative - IR to evaluate for provocative angiography with possible embolization - surgery asked to see for potential consideration of R hemicolectomy if all other interventions unsuccessful, GI feels that R colon diverticular bleeding is the most likely source. I discussed extensively with him yesterday the possible risks of surgery.   FEN: NPO since midnight. Currently declining additional studies, okay for regular diet from my standpoint.  VTE: no chemical prophylaxis in setting of GIB ID: Vanc 2/8>> From surgical standpoint, no need for antibiotics, defer to primary team regarding management for other indications.    Lengthy  conversation with the patient this morning and his son Barkley Bruns via phone. Patient's biggest hesitation is provocative maneuvers to identify bleeding source. Clearly counseled that this would be second line, only if Meckel scan is negative. Provided the option of just doing Meckel scan and no additional diagnotic or therapeutic maneuvers if negative and patient continues to decline. He feels his bleeding has improved, although he states his last BM was 2/9 at 1100 and was mostly blood. Counseled that he still may be slowly bleeding and patient is firm that he does not want any additional studies done. I think not proceeding with at least the Meckel scan is not in his best interest, and we discussed the risks of this, including persistent bleeding, both small and large volume, continued syncopal episodes and resultant trauma. He verbalizes understanding and accepts these risks. With declination of additional imaging, he can be started on a regular diet. I would not initiate chemical DVT prophylaxis. I believe a documented BM that is more stool than blood would be the best criterion for the safest possible discharge in this setting. He inquired about iron supplementation and I clearly counseled that I do not recommend high dose iron, as it increases his risk for constipation and potentially bleeding as a result of hard stool passing beyond a friable area and/or increased pressure from straining. He verbalized understanding. From my perspective, it would be reasonable to give a dose of IV iron prior to discharge and no more than one tablet daily of ferrous sulfate 325 (lower doses or none also acceptable). This patient was discussed with the primary team as well as the gastroenterologist caring for him.    Jesusita Oka, MD Trauma & General Surgery Please use [email or web address] to contact on call provider  02/06/2021  *Care during the described time interval was provided by me. I have reviewed this patient's available  data, including medical history, events of note, physical examination and test results as part of my evaluation.

## 2021-02-06 NOTE — Progress Notes (Signed)
PROGRESS NOTE    Jay Robertson  KYH:[id number] DOB: 1956/09/25 DOA: 01/29/2021 PCP: Billie Ruddy, MD   Chief Complaint  Patient presents with  . GI Bleeding    Brief Narrative:  65 year old with prior history of diverticulosis, essential hypertension admitted with bright red blood per rectum with intermittent hematochezia whom we are consulted for ongoing large-volume hematochezia.  He underwent multiple CT angiograms that were negative.  Colonoscopy and upper endoscopy completed on 2 5/22 showed no evidence of bleeding in the upper GI, no evidence of bleed on colonoscopy.  But there was blood throughout the large intestine with diverticulosis. Tagged RBC scan on 02/02/2021 was unrevealing.  Patient underwent 9  units of PRBC transfusions so far and repeat hemoglobin this morning is around 7.4.  One BM yesterday around 11 am and it had blood clots as per the patient.   IR consulted for embolization, recommended Meckel's scan this am and if negative will need follow up with mesenteric angiogram and possible heparin/TPA challenge. Surgery consulted just in case bowel resection is required.   But patient this am refused further scan's and intervention and wants to wait and see if his bleeding stops. IR, GI aware of the plan.  Repeat labs later this evening and monitor hemoglobin.    Assessment & Plan:   Principal Problem:   Acute lower GI bleeding Active Problems:   Essential hypertension   Syncope   GI bleed   Diverticulosis of colon with hemorrhage   GI bleed/diverticular bleed: He underwent multiple CT angiogram of the abdomen that were negative.  Colonoscopy and upper endoscopy completed on 2/ 5/22 showed no evidence of bleeding in the upper GI, no evidence of bleed on colonoscopy.  But there was blood throughout the large intestine with diverticulosis. Tagged RBC scan on 02/02/2021 was unrevealing.  Patient underwent 9  units of PRBC transfusion and repeat hemoglobin this morning  dropped back to 7.4.  Clear liquid diet and advance as tolerated.  IR consulted  recommended Meckel's scan this am and if negative will need follow up with mesenteric angiogram and possible heparin/TPA challenge. Surgery consulted just in case bowel resection is required.   But patient this am refused further scan's and intervention and wants to wait and see if his bleeding stops. IR, GI aware of the plan.  Repeat labs later this evening and monitor hemoglobin.   Essential Hypertension;  Well controlled.  Orthostatics are pending.      Superficial Thrombophlebitis: on IV vancomycin .  Blood cultures ordered, one bottle out of 4 positive for staph Hominis ID on board and recommended to continue with IV VANCOMYCIN while in the hospital and plan for short course of oral linezolid at discharge.  Echocardiogram ordered. It shows Left ventricular ejection fraction, by estimation, is 60  to 65%. The left ventricle has normal function. The left ventricle has no  regional wall motion abnormalities. The left ventricular internal cavity  size was normal in size. There is  mild left ventricular hypertrophy. Left ventricular diastolic parameters  are consistent with Grade I diastolic dysfunction (impaired relaxation   DVT prophylaxis: SCDs Code Status: Full code Family Communication: Family at bedside Disposition:   Status is: Inpatient  Remains inpatient appropriate because:Ongoing diagnostic testing needed not appropriate for outpatient work up, Unsafe d/c plan and Inpatient level of care appropriate due to severity of illness   Dispo: The patient is from: Home  Anticipated d/c is to: Home              Anticipated d/c date is: > 3 days              Patient currently is not medically stable to d/c.   Difficult to place patient No       Level of care: Progressive Consultants:   PCCM  Gastroenterology  Infectious disease  IR  Procedures:  EGD Colonoscopy   Antimicrobials: None   Subjective: NO BM this morning. Slightly dizzy, no syncope.   Objective: Vitals:   02/05/21 2343 02/06/21 0335 02/06/21 0833 02/06/21 1049  BP:  120/72 127/78 116/74  Pulse:  86 80 79  Resp: 17 20 20 16   Temp:  97.9 F (36.6 C) 98.3 F (36.8 C) 98.1 F (36.7 C)  TempSrc:  Oral Oral Oral  SpO2: 99% 99% 100% 99%  Weight:  76.4 kg    Height:        Intake/Output Summary (Last 24 hours) at 02/06/2021 1300 Last data filed at 02/06/2021 1051 Gross per 24 hour  Intake 1500.05 ml  Output 2675 ml  Net -1174.95 ml   Filed Weights   02/03/21 0857 02/05/21 0030 02/06/21 0335  Weight: 79.4 kg 78.6 kg 76.4 kg    Examination:  General exam: alert and comfortable, not in distress.  Respiratory system: Air entry fair bilateral no wheezing or rhonchi Cardiovascular system: S1-S2 heard, regular rate rhythm, no JVD, no pedal edema Gastrointestinal system: Abdomen is soft, nontender, nondistended, bowel sounds normal Central nervous system: Alert and oriented, grossly nonfocal Extremities: No pedal edema Skin: No rashes seen Psychiatry: Mood is appropriate    Data Reviewed: I have personally reviewed following labs and imaging studies  CBC: Recent Labs  Lab 01/30/21 1456 01/31/21 0528 02/03/21 0613 02/03/21 1559 02/04/21 1912 02/05/21 0332 02/05/21 0926 02/05/21 1901 02/06/21 0307  WBC 8.2  --  8.8  --   --   --  8.1  --  6.9  HGB 8.6*   < > 6.5*   < > 6.9* 9.3* 8.2* 7.6* 7.4*  HCT 23.9*   < > 19.2*   < > 20.7* 25.9* 25.0* 21.7* 21.4*  MCV 90.2  --  89.7  --   --   --  89.6  --  87.0  PLT 179  --  127*  --   --   --  154  --  166   < > = values in this interval not displayed.    Basic Metabolic Panel: Recent Labs  Lab 01/30/21 1456 02/02/21 0644 02/03/21 0613 02/04/21 1912 02/06/21 0307  NA 137 138 137 135 136  K 3.9 3.8 3.7 3.6 3.9  CL 109 109 107 107 105  CO2 20* 21* 23 19* 22  GLUCOSE 125* 84 83 115* 92  BUN 17  14 11 12 10   CREATININE 0.97 0.98 0.80 0.91 0.96  CALCIUM 7.8* 7.5* 7.2* 7.7* 8.1*    GFR: Estimated Creatinine Clearance: 81.7 mL/min (by C-G formula based on SCr of 0.96 mg/dL).  Liver Function Tests: No results for input(s): AST, ALT, ALKPHOS, BILITOT, PROT, ALBUMIN in the last 168 hours.  CBG: Recent Labs  Lab 01/31/21 1235 02/04/21 1906  GLUCAP 124* 96     Recent Results (from the past 240 hour(s))  SARS CORONAVIRUS 2 (TAT 6-24 HRS) Nasopharyngeal Nasopharyngeal Swab     Status: None   Collection Time: 01/29/21  8:00 PM   Specimen: Nasopharyngeal Swab  Result Value Ref Range Status   SARS Coronavirus 2 NEGATIVE NEGATIVE Final    Comment: (NOTE) SARS-CoV-2 target nucleic acids are NOT DETECTED.  The SARS-CoV-2 RNA is generally detectable in upper and lower respiratory specimens during the acute phase of infection. Negative results do not preclude SARS-CoV-2 infection, do not rule out co-infections with other pathogens, and should not be used as the sole basis for treatment or other patient management decisions. Negative results must be combined with clinical observations, patient history, and epidemiological information. The expected result is Negative.  Fact Sheet for Patients: SugarRoll.be  Fact Sheet for Healthcare Providers: https://www.woods-mathews.com/  This test is not yet approved or cleared by the [place] FDA and  has been authorized for detection and/or diagnosis of SARS-CoV-2 by FDA under an Emergency Use Authorization (EUA). This EUA will remain  in effect (meaning this test can be used) for the duration of the COVID-19 declaration under Se ction 564(b)(1) of the Act, 21 U.S.C. section 360bbb-3(b)(1), unless the authorization is terminated or revoked sooner.  Performed at [facility], [place] [address] [place], [place] [address]   Culture, blood (routine x 2)     Status: None (Preliminary  result)   Collection Time: 02/04/21  3:08 PM   Specimen: BLOOD  Result Value Ref Range Status   Specimen Description BLOOD RIGHT ANTECUBITAL  Final   Special Requests   Final    BOTTLES DRAWN AEROBIC AND ANAEROBIC Blood Culture adequate volume   Culture   Final    NO GROWTH 2 DAYS Performed at [facility], [place] [address], [place], [place] [address]    Report Status PENDING  Incomplete  Culture, blood (routine x 2)     Status: Abnormal   Collection Time: 02/04/21  3:09 PM   Specimen: BLOOD  Result Value Ref Range Status   Specimen Description BLOOD RIGHT ANTECUBITAL  Final   Special Requests   Final    BOTTLES DRAWN AEROBIC AND ANAEROBIC Blood Culture adequate volume   Culture  Setup Time   Final    GRAM POSITIVE COCCI IN CLUSTERS AEROBIC BOTTLE ONLY CRITICAL RESULT CALLED TO, READ BACK BY AND VERIFIED WITH: K HURTH PHARMD 1612 02/05/21 A BROWNING    Culture (A)  Final    STAPHYLOCOCCUS HOMINIS THE SIGNIFICANCE OF ISOLATING THIS ORGANISM FROM A SINGLE SET OF BLOOD CULTURES WHEN MULTIPLE SETS ARE DRAWN IS UNCERTAIN. PLEASE NOTIFY THE MICROBIOLOGY DEPARTMENT WITHIN ONE WEEK IF SPECIATION AND SENSITIVITIES ARE REQUIRED. Performed at [facility], [place] [address] [place], [place] [address]    Report Status 02/06/2021 FINAL  Final  Blood Culture ID Panel (Reflexed)     Status: Abnormal   Collection Time: 02/04/21  3:09 PM  Result Value Ref Range Status   Enterococcus faecalis NOT DETECTED NOT DETECTED Final   Enterococcus Faecium NOT DETECTED NOT DETECTED Final   Listeria monocytogenes NOT DETECTED NOT DETECTED Final   Staphylococcus species DETECTED (A) NOT DETECTED Final    Comment: CRITICAL RESULT CALLED TO, READ BACK BY AND VERIFIED WITH: Lanae Boast PHARMD 1612 02/05/21 A BROWNING    Staphylococcus aureus (BCID) NOT DETECTED NOT DETECTED Final   Staphylococcus epidermidis NOT DETECTED NOT DETECTED Final   Staphylococcus lugdunensis NOT DETECTED NOT DETECTED Final    Streptococcus species NOT DETECTED NOT DETECTED Final   Streptococcus agalactiae NOT DETECTED NOT DETECTED Final   Streptococcus pneumoniae NOT DETECTED NOT DETECTED Final   Streptococcus pyogenes NOT DETECTED NOT DETECTED Final  A.calcoaceticus-baumannii NOT DETECTED NOT DETECTED Final   Bacteroides fragilis NOT DETECTED NOT DETECTED Final   Enterobacterales NOT DETECTED NOT DETECTED Final   Enterobacter cloacae complex NOT DETECTED NOT DETECTED Final   Escherichia coli NOT DETECTED NOT DETECTED Final   Klebsiella aerogenes NOT DETECTED NOT DETECTED Final   Klebsiella oxytoca NOT DETECTED NOT DETECTED Final   Klebsiella pneumoniae NOT DETECTED NOT DETECTED Final   Proteus species NOT DETECTED NOT DETECTED Final   Salmonella species NOT DETECTED NOT DETECTED Final   Serratia marcescens NOT DETECTED NOT DETECTED Final   Haemophilus influenzae NOT DETECTED NOT DETECTED Final   Neisseria meningitidis NOT DETECTED NOT DETECTED Final   Pseudomonas aeruginosa NOT DETECTED NOT DETECTED Final   Stenotrophomonas maltophilia NOT DETECTED NOT DETECTED Final   Candida albicans NOT DETECTED NOT DETECTED Final   Candida auris NOT DETECTED NOT DETECTED Final   Candida glabrata NOT DETECTED NOT DETECTED Final   Candida krusei NOT DETECTED NOT DETECTED Final   Candida parapsilosis NOT DETECTED NOT DETECTED Final   Candida tropicalis NOT DETECTED NOT DETECTED Final   Cryptococcus neoformans/gattii NOT DETECTED NOT DETECTED Final    Comment: Performed at [facility], [place]. [address], [place], [place] [address]         Radiology Studies: ECHOCARDIOGRAM COMPLETE  Result Date: 02/04/2021    ECHOCARDIOGRAM REPORT   Patient Name:   Jay Robertson Date of Exam: 02/04/2021 Medical Rec #:  [id number]   Height:       71.0 in Accession #:    [id number]  Weight:       175.0 lb Date of Birth:  1956/05/12    BSA:          1.992 m Patient Age:    10 years    BP:           134/81 mmHg Patient Gender: M            HR:           80 bpm. Exam Location:  Inpatient Procedure: 2D Echo Indications:    murmur  History:        Patient has no prior history of Echocardiogram examinations.                 Signs/Symptoms:Syncope; Risk Factors:Hypertension.  Sonographer:    Johny Chess Referring Phys: [id number] LAURA P CLARK  Sonographer Comments: Image acquisition challenging due to respiratory motion. IMPRESSIONS  1. Left ventricular ejection fraction, by estimation, is 60 to 65%. The left ventricle has normal function. The left ventricle has no regional wall motion abnormalities. There is mild left ventricular hypertrophy. Left ventricular diastolic parameters are consistent with Grade I diastolic dysfunction (impaired relaxation).  2. Right ventricular systolic function is normal. The right ventricular size is normal.  3. The mitral valve is normal in structure. No evidence of mitral valve regurgitation. No evidence of mitral stenosis.  4. The aortic valve has an indeterminant number of cusps. Aortic valve regurgitation is not visualized. No aortic stenosis is present.  5. The inferior vena cava is normal in size with greater than 50% respiratory variability, suggesting right atrial pressure of 3 mmHg. FINDINGS  Left Ventricle: Left ventricular ejection fraction, by estimation, is 60 to 65%. The left ventricle has normal function. The left ventricle has no regional wall motion abnormalities. The left ventricular internal cavity size was normal in size. There is  mild left ventricular hypertrophy. Left ventricular diastolic parameters are consistent with Grade I  diastolic dysfunction (impaired relaxation). Right Ventricle: The right ventricular size is normal. Right ventricular systolic function is normal. Left Atrium: Left atrial size was normal in size. Right Atrium: Right atrial size was normal in size. Pericardium: Trivial pericardial effusion is present. Mitral Valve: The mitral valve is normal in structure. No evidence of  mitral valve regurgitation. No evidence of mitral valve stenosis. Tricuspid Valve: The tricuspid valve is normal in structure. Tricuspid valve regurgitation is trivial. No evidence of tricuspid stenosis. Aortic Valve: The aortic valve has an indeterminant number of cusps. Aortic valve regurgitation is not visualized. No aortic stenosis is present. Pulmonic Valve: The pulmonic valve was normal in structure. Pulmonic valve regurgitation is not visualized. No evidence of pulmonic stenosis. Aorta: The aortic root is normal in size and structure. Venous: The inferior vena cava is normal in size with greater than 50% respiratory variability, suggesting right atrial pressure of 3 mmHg. IAS/Shunts: No atrial level shunt detected by color flow Doppler.  LEFT VENTRICLE PLAX 2D LVIDd:         3.90 cm  Diastology LVIDs:         2.40 cm  LV e' medial:    8.16 cm/s LV PW:         1.10 cm  LV E/e' medial:  10.6 LV IVS:        1.40 cm  LV e' lateral:   11.00 cm/s LVOT diam:     2.10 cm  LV E/e' lateral: 7.8 LV SV:         90 LV SV Index:   45 LVOT Area:     3.46 cm  RIGHT VENTRICLE             IVC RV S prime:     15.90 cm/s  IVC diam: 1.20 cm TAPSE (M-mode): 2.1 cm LEFT ATRIUM             Index       RIGHT ATRIUM           Index LA diam:        3.20 cm 1.61 cm/m  RA Area:     12.20 cm LA Vol (A2C):   35.9 ml 18.02 ml/m RA Volume:   28.90 ml  14.50 ml/m LA Vol (A4C):   38.8 ml 19.47 ml/m LA Biplane Vol: 38.4 ml 19.27 ml/m  AORTIC VALVE LVOT Vmax:   144.00 cm/s LVOT Vmean:  94.700 cm/s LVOT VTI:    0.259 m  AORTA Ao Root diam: 3.10 cm Ao Asc diam:  3.20 cm MITRAL VALVE MV Area (PHT): 3.99 cm    SHUNTS MV Decel Time: 190 msec    Systemic VTI:  0.26 m MV E velocity: 86.30 cm/s  Systemic Diam: 2.10 cm MV A velocity: 83.40 cm/s MV E/A ratio:  1.03 Kirk Ruths MD Electronically signed by Kirk Ruths MD Signature Date/Time: 02/04/2021/4:13:26 PM    Final    CT Angio Abd/Pel w/ and/or w/o  Result Date: 02/05/2021 CLINICAL  DATA:  65 year old with GI bleeding. Evaluate for bleeding source. EXAM: CTA ABDOMEN AND PELVIS WITHOUT AND WITH CONTRAST TECHNIQUE: Multidetector CT imaging of the abdomen and pelvis was performed using the standard protocol during bolus administration of intravenous contrast. Multiplanar reconstructed images and MIPs were obtained and reviewed to evaluate the vascular anatomy. CONTRAST:  142mL OMNIPAQUE IOHEXOL 350 MG/ML SOLN COMPARISON:  CT 08/20/2021 FINDINGS: VASCULAR Aorta: Mild atherosclerotic disease without stenosis, aneurysm or dissection. 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 Main branch vessels are patent. Variant anatomy with the right hepatic artery originating near the origin of the common hepatic artery. There is also a replaced left hepatic artery. SMA: Patent without evidence of aneurysm, dissection, vasculitis or significant stenosis. Prominent pancreatic-duodenal branches but no evidence for active bleeding or pseudoaneurysm formation. Renals: Single right renal artery is widely patent without aneurysm, dissection or FMD. Main left renal artery is patent without aneurysm, dissection or FMD. There is a small accessory left renal artery. IMA: Patent Inflow: Patent without evidence of aneurysm, dissection, vasculitis or significant stenosis. Mild atherosclerotic disease in the right iliac arteries. Proximal Outflow: Proximal femoral arteries are patent bilaterally. Veins: Portal venous system is patent. Hepatic veins are patent. IVC and renal veins are patent. Bilateral iliac veins are patent. Visualized proximal femoral veins are patent. Review of the MIP images confirms the above findings. NON-VASCULAR Lower chest: Few patchy densities in the left lower lobe are unchanged and probably represent postinflammatory changes. Otherwise, lung bases are clear. No pleural effusions. Hepatobiliary: Normal appearance of the liver and  gallbladder. No biliary dilatation. No evidence for a hepatic lesion. Pancreas: Unremarkable. No pancreatic ductal dilatation or surrounding inflammatory changes. Spleen: Normal in size without focal abnormality. Adrenals/Urinary Tract: Normal appearance of the adrenal glands. Evidence for extrarenal pelvis bilaterally. Small exophytic low-density along the posterior left kidney is suggestive for a cyst based on the Hounsfield units. No suspicious renal lesion. Normal appearance of the urinary bladder. Stomach/Bowel: The endoscopy capsule is located in the cecum. No evidence for active GI bleeding. No acute bowel inflammation. Mild distention of the distal stomach and proximal duodenal region. Small diverticula involving the right colon. Small diverticula in the proximal sigmoid colon region Lymphatic: No lymph node enlargement in the abdomen or pelvis. Reproductive: Prostate is mildly prominent measuring 4.6 cm in the transverse dimension. Other: Negative for ascites. Negative for free air. Tiny ventral hernia containing fat. Musculoskeletal: No acute bone abnormality. IMPRESSION: VASCULAR 1. No evidence for active GI bleeding. 2. Mild atherosclerotic disease. NON-VASCULAR 1. No acute abnormality in the abdomen or pelvis. 2. Endoscopy capsule is located in the cecum. 3. Persistent small densities in left lung lower lobe that could represent postinflammatory changes. Electronically Signed   By: Markus Daft M.D.   On: 02/05/2021 14:12        Scheduled Meds: . pantoprazole  40 mg Oral BID AC   Continuous Infusions: . sodium chloride 10 mL/hr at 02/05/21 2119  . vancomycin 1,250 mg (02/06/21 0905)     LOS: 7 days       Hosie Poisson, MD [facility]   To contact the attending provider between 7A-7P or the covering provider during after hours 7P-7A, please log into the web site [email or web address] and access using universal [place] password for that web site. If you do not have the password,  please call the hospital operator.  02/06/2021, 1:00 PM

## 2021-02-06 NOTE — Progress Notes (Signed)
   02/06/21 1617  Vitals  ECG Heart Rate 78  Resp 13  MEWS COLOR  MEWS Score Color Green  Orthostatic Lying   BP- Lying 122/88  Pulse- Lying 90  Orthostatic Sitting  BP- Sitting 135/82  Pulse- Sitting 87  Orthostatic Standing at 0 minutes  BP- Standing at 0 minutes 123/82  Pulse- Standing at 0 minutes 110  Orthostatic Standing at 3 minutes  BP- Standing at 3 minutes (!) 144/91  Pulse- Standing at 3 minutes 114  Oxygen Therapy  SpO2 100 %  MEWS Score  MEWS Temp 0  MEWS Systolic 0  MEWS Pulse 0  MEWS RR 1  MEWS LOC 0  MEWS Score 1    Patient tolerated the shift in positions well. Patient has no complaints of lightheadedness during the duration. Will continue to monitor for changes.

## 2021-02-07 ENCOUNTER — Other Ambulatory Visit: Payer: Self-pay | Admitting: Internal Medicine

## 2021-02-07 DIAGNOSIS — I808 Phlebitis and thrombophlebitis of other sites: Secondary | ICD-10-CM

## 2021-02-07 DIAGNOSIS — T82868A Thrombosis of vascular prosthetic devices, implants and grafts, initial encounter: Secondary | ICD-10-CM

## 2021-02-07 DIAGNOSIS — K5731 Diverticulosis of large intestine without perforation or abscess with bleeding: Secondary | ICD-10-CM | POA: Diagnosis not present

## 2021-02-07 DIAGNOSIS — K922 Gastrointestinal hemorrhage, unspecified: Secondary | ICD-10-CM | POA: Diagnosis not present

## 2021-02-07 LAB — CBC
HCT: 20.2 % — ABNORMAL LOW (ref 39.0–52.0)
Hemoglobin: 7.1 g/dL — ABNORMAL LOW (ref 13.0–17.0)
MCH: 30.9 pg (ref 26.0–34.0)
MCHC: 35.1 g/dL (ref 30.0–36.0)
MCV: 87.8 fL (ref 80.0–100.0)
Platelets: 203 10*3/uL (ref 150–400)
RBC: 2.3 MIL/uL — ABNORMAL LOW (ref 4.22–5.81)
RDW: 17.2 % — ABNORMAL HIGH (ref 11.5–15.5)
WBC: 6 10*3/uL (ref 4.0–10.5)
nRBC: 0 % (ref 0.0–0.2)

## 2021-02-07 LAB — HEMOGLOBIN AND HEMATOCRIT, BLOOD
HCT: 25.2 % — ABNORMAL LOW (ref 39.0–52.0)
Hemoglobin: 8.4 g/dL — ABNORMAL LOW (ref 13.0–17.0)

## 2021-02-07 LAB — PREPARE RBC (CROSSMATCH)

## 2021-02-07 MED ORDER — SODIUM CHLORIDE 0.9% IV SOLUTION: Freq: Once | INTRAVENOUS | Status: AC

## 2021-02-07 MED ORDER — LINEZOLID 600 MG PO TABS: 600.0000 mg | ORAL_TABLET | Freq: Two times a day (BID) | ORAL | 0 refills | Status: AC

## 2021-02-07 MED FILL — LINEZOLID 600 MG TABS: 600 | 14 days supply | Qty: 28 | Fill #0

## 2021-02-07 NOTE — Evaluation (Signed)
Physical Therapy Evaluation and D/C Patient Details Name: Jay Robertson MRN: [id number] DOB: November 08, 1956 Today's Date: 02/07/2021   History of Present Illness  65 year old with prior history of diverticulosis, essential hypertension admitted with bright red blood per rectum with intermittent hematochezia whom we are consulted for ongoing large-volume hematochezia.  He underwent multiple CT angiograms that were negative.  Colonoscopy and upper endoscopy completed on 2 5/22 showed no evidence of bleeding in the upper GI, no evidence of bleed on colonoscopy.  But there was blood throughout the large intestine with diverticulosis.Patient underwent 9  units of PRBC transfusions .IR consulted for embolization 2/11, recommended Meckel's scan  and if negative will need follow up with mesenteric angiogram and possible heparin/TPA challenge. Surgery consulted just in case bowel resection is required.  Pt refused the scan and angiogram or any other testing as he is frustrated that nothing has been found.  Clinical Impression  Pt admitted with above diagnosis. Pt currently without functional limitations and is independent with ambulation. Does not need device or assist.  VSS as well. Pt does not need skilled PT at this time. Nursing can ambulate with pt daily multiple times a day. Will sign off.   Follow Up Recommendations No PT follow up    Equipment Recommendations  None recommended by PT    Recommendations for Other Services       Precautions / Restrictions Precautions Precautions: None Restrictions Weight Bearing Restrictions: No      Mobility  Bed Mobility Overal bed mobility: Independent                  Transfers Overall transfer level: Independent                  Ambulation/Gait Ambulation/Gait assistance: Independent Gait Distance (Feet): 800 Feet Assistive device: None Gait Pattern/deviations: WFL(Within Functional Limits)   Gait velocity interpretation: >2.62 ft/sec,  indicative of community ambulatory General Gait Details: Pt without deficits. Ambulates at fast pace and VSS.  no issues with balance.  Stairs            Wheelchair Mobility    Modified Rankin (Stroke Patients Only)       Balance Overall balance assessment: Independent                                           Pertinent Vitals/Pain Pain Assessment: No/denies pain    Home Living Family/patient expects to be discharged to:: Private residence Living Arrangements: Non-relatives/Friends Available Help at Discharge: Friend(s);Available 24 hours/day Type of Home: Apartment Home Access: Stairs to enter Entrance Stairs-Rails: Right;Left;Can reach both Entrance Stairs-Number of Steps: 15 Home Layout: One level Home Equipment: None      Prior Function Level of Independence: Independent         Comments: Works part time - has to stand at times and drives     Hand Dominance   Dominant Hand: Right    Extremity/Trunk Assessment   Upper Extremity Assessment Upper Extremity Assessment: Defer to OT evaluation    Lower Extremity Assessment Lower Extremity Assessment: Overall WFL for tasks assessed    Cervical / Trunk Assessment Cervical / Trunk Assessment: Normal  Communication   Communication: No difficulties  Cognition Arousal/Alertness: Awake/alert Behavior During Therapy: WFL for tasks assessed/performed Overall Cognitive Status: Within Functional Limits for tasks assessed  General Comments General comments (skin integrity, edema, etc.): 99% on RA.  Intiial BP 110/63, sitting 119/75, standing 130/84.  After walk, 137/85    Exercises     Assessment/Plan    PT Assessment Patent does not need any further PT services  PT Problem List         PT Treatment Interventions      PT Goals (Current goals can be found in the Care Plan section)  Acute Rehab PT Goals Patient Stated Goal:  to go home PT Goal Formulation: All assessment and education complete, DC therapy    Frequency     Barriers to discharge        Co-evaluation               AM-PAC PT "6 Clicks" Mobility  Outcome Measure Help needed turning from your back to your side while in a flat bed without using bedrails?: None Help needed moving from lying on your back to sitting on the side of a flat bed without using bedrails?: None Help needed moving to and from a bed to a chair (including a wheelchair)?: None Help needed standing up from a chair using your arms (e.g., wheelchair or bedside chair)?: None Help needed to walk in hospital room?: None Help needed climbing 3-5 steps with a railing? : None 6 Click Score: 24    End of Session   Activity Tolerance: Patient tolerated treatment well Patient left: in bed;with call bell/phone within reach;with family/visitor present Nurse Communication: Mobility status PT Visit Diagnosis: Muscle weakness (generalized) (M62.81)    Time: 1431-1500 PT Time Calculation (min) (ACUTE ONLY): 29 min   Charges:   PT Evaluation $PT Eval Low Complexity: 1 Low PT Treatments $Gait Training: 8-22 mins        Dawn W,PT Acute Rehabilitation Services Pager:  [phone]  Office:  [place] 02/07/2021, 4:08 PM

## 2021-02-07 NOTE — Progress Notes (Signed)
Progress Note   Subjective  Patient states he is doing okay. Passed small amount of some old blood yesterday evening he thinks (darker blood). Passed gas this AM but no stools, in no pain. Hgb has drifted down slightly.    Objective   Vital signs in last 24 hours: Temp:  [97.8 F (36.6 C)-98.4 F (36.9 C)] 98 F (36.7 C) (02/11 0714) Pulse Rate:  [80] 80 (02/11 0714) Resp:  [12-18] 17 (02/11 0714) BP: (112-125)/(71-77) 125/75 (02/11 0714) SpO2:  [99 %-100 %] 100 % (02/11 0714) Weight:  [75.6 kg] 75.6 kg (02/11 0505) Last BM Date: 02/06/21 General:    AA male in NAD Abdomen:  Soft, nontender and nondistended.  Extremities:  Without edema. Neurologic:  Alert and oriented,  grossly normal neurologically. Psych:  Cooperative. Normal mood and affect.  Intake/Output from previous day: 02/10 0701 - 02/11 0700 In: 1640 [P.O.:870; IV Piggyback:770] Out: 975 [Urine:975] Intake/Output this shift: Total I/O In: 120 [P.O.:120] Out: -   Lab Results: Recent Labs    02/05/21 0926 02/05/21 1901 02/06/21 0307 02/06/21 1757 02/07/21 0142  WBC 8.1  --  6.9  --  6.0  HGB 8.2*   < > 7.4* 7.5* 7.1*  HCT 25.0*   < > 21.4* 21.6* 20.2*  PLT 154  --  166  --  203   < > = values in this interval not displayed.   BMET Recent Labs    02/04/21 1912 02/06/21 0307  NA 135 136  K 3.6 3.9  CL 107 105  CO2 19* 22  GLUCOSE 115* 92  BUN 12 10  CREATININE 0.91 0.96  CALCIUM 7.7* 8.1*   LFT No results for input(s): PROT, ALBUMIN, AST, ALT, ALKPHOS, BILITOT, BILIDIR, IBILI in the last 72 hours. PT/INR Recent Labs    02/04/21 1932  LABPROT 16.1*  INR 1.3*    Studies/Results: CT Angio Abd/Pel w/ and/or w/o  Result Date: 02/05/2021 CLINICAL DATA:  65 year old with GI bleeding. Evaluate for bleeding source. EXAM: CTA ABDOMEN AND PELVIS WITHOUT AND WITH CONTRAST TECHNIQUE: Multidetector CT imaging of the abdomen and pelvis was performed using the standard protocol during bolus  administration of intravenous contrast. Multiplanar reconstructed images and MIPs were obtained and reviewed to evaluate the vascular anatomy. CONTRAST:  120mL OMNIPAQUE IOHEXOL 350 MG/ML SOLN COMPARISON:  CT 08/20/2021 FINDINGS: VASCULAR Aorta: Mild atherosclerotic disease without stenosis, aneurysm or dissection. 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 Main branch vessels are patent. Variant anatomy with the right hepatic artery originating near the origin of the common hepatic artery. There is also a replaced left hepatic artery. SMA: Patent without evidence of aneurysm, dissection, vasculitis or significant stenosis. Prominent pancreatic-duodenal branches but no evidence for active bleeding or pseudoaneurysm formation. Renals: Single right renal artery is widely patent without aneurysm, dissection or FMD. Main left renal artery is patent without aneurysm, dissection or FMD. There is a small accessory left renal artery. IMA: Patent Inflow: Patent without evidence of aneurysm, dissection, vasculitis or significant stenosis. Mild atherosclerotic disease in the right iliac arteries. Proximal Outflow: Proximal femoral arteries are patent bilaterally. Veins: Portal venous system is patent. Hepatic veins are patent. IVC and renal veins are patent. Bilateral iliac veins are patent. Visualized proximal femoral veins are patent. Review of the MIP images confirms the above findings. NON-VASCULAR Lower chest: Few patchy densities in the left lower lobe are unchanged and probably represent postinflammatory changes. Otherwise, lung  bases are clear. No pleural effusions. Hepatobiliary: Normal appearance of the liver and gallbladder. No biliary dilatation. No evidence for a hepatic lesion. Pancreas: Unremarkable. No pancreatic ductal dilatation or surrounding inflammatory changes. Spleen: Normal in size without focal abnormality. Adrenals/Urinary Tract: Normal  appearance of the adrenal glands. Evidence for extrarenal pelvis bilaterally. Small exophytic low-density along the posterior left kidney is suggestive for a cyst based on the Hounsfield units. No suspicious renal lesion. Normal appearance of the urinary bladder. Stomach/Bowel: The endoscopy capsule is located in the cecum. No evidence for active GI bleeding. No acute bowel inflammation. Mild distention of the distal stomach and proximal duodenal region. Small diverticula involving the right colon. Small diverticula in the proximal sigmoid colon region Lymphatic: No lymph node enlargement in the abdomen or pelvis. Reproductive: Prostate is mildly prominent measuring 4.6 cm in the transverse dimension. Other: Negative for ascites. Negative for free air. Tiny ventral hernia containing fat. Musculoskeletal: No acute bone abnormality. IMPRESSION: VASCULAR 1. No evidence for active GI bleeding. 2. Mild atherosclerotic disease. NON-VASCULAR 1. No acute abnormality in the abdomen or pelvis. 2. Endoscopy capsule is located in the cecum. 3. Persistent small densities in left lung lower lobe that could represent postinflammatory changes. Electronically Signed   By: Markus Daft M.D.   On: 02/05/2021 14:12       Assessment / Plan:    65 y/o male with suspected stuttering right sided diverticular bleed based off extensive evaluation to date for this (EGD, colonoscopy, capsule study, tagged RBC scan, CTA X 3). Meckel scan and provocative angiography were recommended to him and offered yesterday which he declined. He is hoping this resolves on its own, although he is clearly at risk for continued bleeding. Hgb has drifted slightly and passed what sounds like some older blood last night. We discussed next steps, he continues to decline additional evaluation for this for now, wishes to continue to monitor and hope it resolves. Hopefully that is the case, if he rebleeds however he will consider further workup. Of note, gastric  biopsies were positive for H pylori, he will need therapy for that once outpatient after his acute bleeding has resolved.   Plan: - Meckel's scan recommended to complete his workup and ensure this is diverticular in nature, he continues to decline but will reconsider if he rebleeds - continue soft diet and monitor for recurrent bleeding. He would benefit from another unit PRBC to give him some buffer if he does rebleed given Hgb at 7.1 - if he does have rebleeding, would contact IR and they can again evaluate him for possible provocative angiography which would need surgical backup as well - both services have evaluated him for this yesterday. I think repeat yield of repeat colonoscopy is low but could consider if he declines all other options - once outpatient would recommend treatment for H pylori with the following regimen for 14 days: protonix 40mg  BID Amoxicillin 1gm BID Clarithromycin 500mg  BID Flagyl 500mg  BID  We will sign off for now, but available for questions or re-evaluation if needed. If he does not have any further bleeding over the next 24 hours can consider d/c home later this weekend. Our office will contact him for outpatient follow up after his discharge.  Lashmeet Cellar, MD [facility]

## 2021-02-07 NOTE — Progress Notes (Signed)
[facility] for Infectious Disease   Reason for visit: Follow up on thrombophlebitis  Interval History: less arm swelling, no pain.  No fever and WBC remains wnl.  No new positive cultures.  Day 4 vancomycin  Physical Exam: Constitutional:  Vitals:   02/07/21 0505 02/07/21 0714  BP: 112/73 125/75  Pulse:  80  Resp: 18 17  Temp: 97.8 F (36.6 C) 98 F (36.7 C)  SpO2: 100% 100%   patient appears in NAD Respiratory: Normal respiratory effort; CTA B Cardiovascular: RRR MS: left arm antecubital area with less swelling, no warmth, no erythema.  Palpable thrombus noted.    Review of Systems: Constitutional: negative for fevers and chills Gastrointestinal: negative for nausea and diarrhea  Lab Results  Component Value Date   WBC 6.0 02/07/2021   HGB 7.1 (L) 02/07/2021   HCT 20.2 (L) 02/07/2021   MCV 87.8 02/07/2021   PLT 203 02/07/2021    Lab Results  Component Value Date   CREATININE 0.96 02/06/2021   BUN 10 02/06/2021   NA 136 02/06/2021   K 3.9 02/06/2021   CL 105 02/06/2021   CO2 22 02/06/2021    Lab Results  Component Value Date   ALT 28 01/29/2021   AST 22 01/29/2021   ALKPHOS 42 01/29/2021     Microbiology: Recent Results (from the past 240 hour(s))  SARS CORONAVIRUS 2 (TAT 6-24 HRS) Nasopharyngeal Nasopharyngeal Swab     Status: None   Collection Time: 01/29/21  8:00 PM   Specimen: Nasopharyngeal Swab  Result Value Ref Range Status   SARS Coronavirus 2 NEGATIVE NEGATIVE Final    Comment: (NOTE) SARS-CoV-2 target nucleic acids are NOT DETECTED.  The SARS-CoV-2 RNA is generally detectable in upper and lower respiratory specimens during the acute phase of infection. Negative results do not preclude SARS-CoV-2 infection, do not rule out co-infections with other pathogens, and should not be used as the sole basis for treatment or other patient management decisions. Negative results must be combined with clinical observations, patient history, and  epidemiological information. The expected result is Negative.  Fact Sheet for Patients: SugarRoll.be  Fact Sheet for Healthcare Providers: https://www.woods-mathews.com/  This test is not yet approved or cleared by the [place] FDA and  has been authorized for detection and/or diagnosis of SARS-CoV-2 by FDA under an Emergency Use Authorization (EUA). This EUA will remain  in effect (meaning this test can be used) for the duration of the COVID-19 declaration under Se ction 564(b)(1) of the Act, 21 U.S.C. section 360bbb-3(b)(1), unless the authorization is terminated or revoked sooner.  Performed at [facility], [place] [address], [place], [place] [address]   Culture, blood (routine x 2)     Status: None (Preliminary result)   Collection Time: 02/04/21  3:08 PM   Specimen: BLOOD  Result Value Ref Range Status   Specimen Description BLOOD RIGHT ANTECUBITAL  Final   Special Requests   Final    BOTTLES DRAWN AEROBIC AND ANAEROBIC Blood Culture adequate volume   Culture   Final    NO GROWTH 3 DAYS Performed at [facility], [place] [address], [place], [place] [address]    Report Status PENDING  Incomplete  Culture, blood (routine x 2)     Status: Abnormal   Collection Time: 02/04/21  3:09 PM   Specimen: BLOOD  Result Value Ref Range Status   Specimen Description BLOOD RIGHT ANTECUBITAL  Final   Special Requests   Final  BOTTLES DRAWN AEROBIC AND ANAEROBIC Blood Culture adequate volume   Culture  Setup Time   Final    GRAM POSITIVE COCCI IN CLUSTERS AEROBIC BOTTLE ONLY CRITICAL RESULT CALLED TO, READ BACK BY AND VERIFIED WITH: K HURTH PHARMD 1612 02/05/21 A BROWNING    Culture (A)  Final    STAPHYLOCOCCUS HOMINIS THE SIGNIFICANCE OF ISOLATING THIS ORGANISM FROM A SINGLE SET OF BLOOD CULTURES WHEN MULTIPLE SETS ARE DRAWN IS UNCERTAIN. PLEASE NOTIFY THE MICROBIOLOGY DEPARTMENT WITHIN ONE WEEK IF SPECIATION AND SENSITIVITIES ARE  REQUIRED. Performed at [facility], [place] [address] [place], [place] [address]    Report Status 02/06/2021 FINAL  Final  Blood Culture ID Panel (Reflexed)     Status: Abnormal   Collection Time: 02/04/21  3:09 PM  Result Value Ref Range Status   Enterococcus faecalis NOT DETECTED NOT DETECTED Final   Enterococcus Faecium NOT DETECTED NOT DETECTED Final   Listeria monocytogenes NOT DETECTED NOT DETECTED Final   Staphylococcus species DETECTED (A) NOT DETECTED Final    Comment: CRITICAL RESULT CALLED TO, READ BACK BY AND VERIFIED WITH: Lanae Boast PHARMD 1612 02/05/21 A BROWNING    Staphylococcus aureus (BCID) NOT DETECTED NOT DETECTED Final   Staphylococcus epidermidis NOT DETECTED NOT DETECTED Final   Staphylococcus lugdunensis NOT DETECTED NOT DETECTED Final   Streptococcus species NOT DETECTED NOT DETECTED Final   Streptococcus agalactiae NOT DETECTED NOT DETECTED Final   Streptococcus pneumoniae NOT DETECTED NOT DETECTED Final   Streptococcus pyogenes NOT DETECTED NOT DETECTED Final   A.calcoaceticus-baumannii NOT DETECTED NOT DETECTED Final   Bacteroides fragilis NOT DETECTED NOT DETECTED Final   Enterobacterales NOT DETECTED NOT DETECTED Final   Enterobacter cloacae complex NOT DETECTED NOT DETECTED Final   Escherichia coli NOT DETECTED NOT DETECTED Final   Klebsiella aerogenes NOT DETECTED NOT DETECTED Final   Klebsiella oxytoca NOT DETECTED NOT DETECTED Final   Klebsiella pneumoniae NOT DETECTED NOT DETECTED Final   Proteus species NOT DETECTED NOT DETECTED Final   Salmonella species NOT DETECTED NOT DETECTED Final   Serratia marcescens NOT DETECTED NOT DETECTED Final   Haemophilus influenzae NOT DETECTED NOT DETECTED Final   Neisseria meningitidis NOT DETECTED NOT DETECTED Final   Pseudomonas aeruginosa NOT DETECTED NOT DETECTED Final   Stenotrophomonas maltophilia NOT DETECTED NOT DETECTED Final   Candida albicans NOT DETECTED NOT DETECTED Final   Candida auris NOT  DETECTED NOT DETECTED Final   Candida glabrata NOT DETECTED NOT DETECTED Final   Candida krusei NOT DETECTED NOT DETECTED Final   Candida parapsilosis NOT DETECTED NOT DETECTED Final   Candida tropicalis NOT DETECTED NOT DETECTED Final   Cryptococcus neoformans/gattii NOT DETECTED NOT DETECTED Final    Comment: Performed at [facility], [address], [place], [place] [address]    Impression/Plan:  1. thrombophlebitis - peripheral IV catheter-associated but no signs of active infection.  One blood culture positive for Staph hominis, most c/w contaminate.  With the potential for infection, I will have him stay on vancomycin while inpatient and will continue with linezolid at discharge for 2 weeks from now.  If he remains inpatient for further interventions, he can stay on vancomycin and take linezolid through 2/24.    2.  Disposition - will provide linezolid through the [facility].  As above, 2 weeks from now.    3.  GI bleed - stable Hgb at this time.  The patient is reluctant to undergo other procedures.    I will sign  off, follow up arranged with Dr. Gale Journey 2/23.

## 2021-02-07 NOTE — Progress Notes (Signed)
Pharmacy Antibiotic Note  Jay Robertson is a 65 y.o. male admitted on 01/29/2021 with catheter associated thrombophlebitis.  Pharmacy has been consulted for vancomycin dosing.  Was started on cephalexin. Afebrile, WBC 6.0. Scr 0.96 (CrCl 81.7 mL/min). Was found to have superficial cephalic venus thrombus consistent with catheter associated thrombophlebitis. Patient improving clinically and swelling around catheter site has gone down. Plan per ID is to continue IV therapy while inpatient, will transition to oral linezolid at discharge. Will check vancomycin trough to ensure levels are not supratherapeutic.  Plan: Continue vancomycin 1250 mg IV every 12 hours (estAUC 508) Vancomycin trough scheduled for 2/12 @ 0730  Monitor renal fx, cx results, clinical pic, and plan to transition to oral therapy at discharge  Height: 5\' 11"  (180.3 cm) Weight: 75.6 kg (166 lb 11.2 oz) IBW/kg (Calculated) : 75.3  Temp (24hrs), Avg:98.2 F (36.8 C), Min:97.8 F (36.6 C), Max:98.4 F (36.9 C)  Recent Labs  Lab 02/02/21 0644 02/03/21 0613 02/04/21 1912 02/05/21 0926 02/06/21 0307 02/07/21 0142  WBC  --  8.8  --  8.1 6.9 6.0  CREATININE 0.98 0.80 0.91  --  0.96  --     Estimated Creatinine Clearance: 81.7 mL/min (by C-G formula based on SCr of 0.96 mg/dL).    Allergies  Allergen Reactions  . Doxycycline Other (See Comments)    Headaches    Antimicrobials this admission: Cephalexin 2/7 >> 2/8 Vancomycin 2/8 >>   Dose adjustments this admission: N/A  Microbiology results: 2/8 BCx: staph hominis  2/2 COVID PCR: neg  Thank you for allowing pharmacy to be a part of this patient's care.  Cephus Slater, PharmD, MBA Pharmacy Resident [phone] 02/07/2021 10:37 AM

## 2021-02-07 NOTE — Progress Notes (Signed)
PROGRESS NOTE    Jay Robertson  GGE:[id number] DOB: Mar 28, 1956 DOA: 01/29/2021 PCP: Billie Ruddy, MD   Chief Complaint  Patient presents with  . GI Bleeding    Brief Narrative:  65 year old with prior history of diverticulosis, essential hypertension admitted with bright red blood per rectum with intermittent hematochezia whom we are consulted for ongoing large-volume hematochezia.  He underwent multiple CT angiograms that were negative.  Colonoscopy and upper endoscopy completed on 2 5/22 showed no evidence of bleeding in the upper GI, no evidence of bleed on colonoscopy.  But there was blood throughout the large intestine with diverticulosis. Tagged RBC scan on 02/02/2021 was unrevealing.  Patient underwent 9  units of PRBC transfusions so far and repeat hemoglobin this morning is around 7.4.  One BM yesterday around 11 am and it had blood clots as per the patient.   IR consulted for embolization, recommended Meckel's scan this am and if negative will need follow up with mesenteric angiogram and possible heparin/TPA challenge. Surgery consulted just in case bowel resection is required.   But patient this am refused further scan's and intervention and wants to wait and see if his bleeding stops. IR, GI aware of the plan.  Repeat labs later this evening and monitor hemoglobin.    Assessment & Plan:   Principal Problem:   Acute lower GI bleeding Active Problems:   Essential hypertension   Syncope   GI bleed   Diverticulosis of colon with hemorrhage   GI bleed/diverticular bleed: He underwent multiple CT angiogram of the abdomen that were negative.  Colonoscopy and upper endoscopy completed on 2/ 5/22 showed no evidence of bleeding in the upper GI, no evidence of bleed on colonoscopy.  But there was blood throughout the large intestine with diverticulosis. Tagged RBC scan on 02/02/2021 was unrevealing.  Patient underwent 9  units of PRBC transfusions Clear liquid diet and advance as  tolerated. He is currently on regular diet.  IR consulted  recommended Meckel's scan this am and if negative will need follow up with mesenteric angiogram and possible heparin/TPA challenge. Surgery consulted just in case bowel resection is required.   But patient refused Meckel's scan and intervention and wants to wait and see if his bleeding stops. IR, GI aware of the plan. Overnight he had two bowel movements with old blood/dark blood. His hemoglobin dropped to 7.1this am. 1 UNIT OF PRBC transfusion ordered.   Gastric biopsies were positive for H pylori. on discharge and after bleeding stops he will need prescriptions  for 14 days: protonix 40mg  BID Amoxicillin 1gm BID Clarithromycin 500mg  BID Flagyl 500mg  BID    Essential Hypertension;  BP parameters are optimal.    Superficial Thrombophlebitis: on IV vancomycin .  Blood cultures ordered, one bottle out of 4 , positive for staph Hominis ID on board and recommended stay on vancomycin while inpatient and will continue with linezolid at discharge for 2 weeks from now.  If he remains inpatient for further interventions, he can stay on vancomycin and take linezolid through 2/24.    Echocardiogram ordered. It shows Left ventricular ejection fraction, by estimation, is 60  to 65%. The left ventricle has normal function. The left ventricle has no  regional wall motion abnormalities. The left ventricular internal cavity size was normal in size.    DVT prophylaxis: SCDs Code Status: Full code Family Communication: Family at bedside Disposition:   Status is: Inpatient  Remains inpatient appropriate because:Ongoing diagnostic testing needed not appropriate for outpatient  work up, Unsafe d/c plan and Inpatient level of care appropriate due to severity of illness   Dispo: The patient is from: Home              Anticipated d/c is to: Home              Anticipated d/c date is: 2 days              Patient currently is not medically stable  to d/c.   Difficult to place patient No       Level of care: Progressive Consultants:   PCCM  Gastroenterology  Infectious disease  IR  Procedures: EGD Colonoscopy   Antimicrobials: None   Subjective: NO BM this morning. Slightly dizzy, no syncope.   Objective: Vitals:   02/07/21 1112 02/07/21 1234 02/07/21 1304 02/07/21 1323  BP: 113/79 118/72 (!) 113/58 110/63  Pulse: 79 84 75 88  Resp: 19 16 17 14   Temp: 97.8 F (36.6 C) 98.3 F (36.8 C) 98.5 F (36.9 C) 98.6 F (37 C)  TempSrc: Oral Oral Oral Oral  SpO2: 100% 98% 100% 100%  Weight:      Height:        Intake/Output Summary (Last 24 hours) at 02/07/2021 1532 Last data filed at 02/07/2021 1420 Gross per 24 hour  Intake 1490 ml  Output 1775 ml  Net -285 ml   Filed Weights   02/05/21 0030 02/06/21 0335 02/07/21 0505  Weight: 78.6 kg 76.4 kg 75.6 kg    Examination:  General exam: Alert and comfortable, not in distress Respiratory system: Air entry fair bilateral, no wheezing or rhonchi Cardiovascular system: S1-S2 heard, regular rate rhythm, no JVD no pedal edema Gastrointestinal system: Abdomen is soft, nontender, nondistended bowel sounds normal Central nervous system: Alert and oriented, grossly nonfocal Extremities: No pedal edema Skin: No rashes seen Psychiatry: Mood is appropriate    Data Reviewed: I have personally reviewed following labs and imaging studies  CBC: Recent Labs  Lab 02/03/21 0613 02/03/21 1559 02/05/21 0926 02/05/21 1901 02/06/21 0307 02/06/21 1757 02/07/21 0142  WBC 8.8  --  8.1  --  6.9  --  6.0  HGB 6.5*   < > 8.2* 7.6* 7.4* 7.5* 7.1*  HCT 19.2*   < > 25.0* 21.7* 21.4* 21.6* 20.2*  MCV 89.7  --  89.6  --  87.0  --  87.8  PLT 127*  --  154  --  166  --  203   < > = values in this interval not displayed.    Basic Metabolic Panel: Recent Labs  Lab 02/02/21 0644 02/03/21 0613 02/04/21 1912 02/06/21 0307  NA 138 137 135 136  K 3.8 3.7 3.6 3.9  CL 109  107 107 105  CO2 21* 23 19* 22  GLUCOSE 84 83 115* 92  BUN 14 11 12 10   CREATININE 0.98 0.80 0.91 0.96  CALCIUM 7.5* 7.2* 7.7* 8.1*    GFR: Estimated Creatinine Clearance: 81.7 mL/min (by C-G formula based on SCr of 0.96 mg/dL).  Liver Function Tests: No results for input(s): AST, ALT, ALKPHOS, BILITOT, PROT, ALBUMIN in the last 168 hours.  CBG: Recent Labs  Lab 02/04/21 1906  GLUCAP 96     Recent Results (from the past 240 hour(s))  SARS CORONAVIRUS 2 (TAT 6-24 HRS) Nasopharyngeal Nasopharyngeal Swab     Status: None   Collection Time: 01/29/21  8:00 PM   Specimen: Nasopharyngeal Swab  Result Value Ref Range Status  SARS Coronavirus 2 NEGATIVE NEGATIVE Final    Comment: (NOTE) SARS-CoV-2 target nucleic acids are NOT DETECTED.  The SARS-CoV-2 RNA is generally detectable in upper and lower respiratory specimens during the acute phase of infection. Negative results do not preclude SARS-CoV-2 infection, do not rule out co-infections with other pathogens, and should not be used as the sole basis for treatment or other patient management decisions. Negative results must be combined with clinical observations, patient history, and epidemiological information. The expected result is Negative.  Fact Sheet for Patients: SugarRoll.be  Fact Sheet for Healthcare Providers: https://www.woods-mathews.com/  This test is not yet approved or cleared by the [place] FDA and  has been authorized for detection and/or diagnosis of SARS-CoV-2 by FDA under an Emergency Use Authorization (EUA). This EUA will remain  in effect (meaning this test can be used) for the duration of the COVID-19 declaration under Se ction 564(b)(1) of the Act, 21 U.S.C. section 360bbb-3(b)(1), unless the authorization is terminated or revoked sooner.  Performed at [facility], [employer] [address], [place], [place] [address]   Culture, blood (routine x 2)      Status: None (Preliminary result)   Collection Time: 02/04/21  3:08 PM   Specimen: BLOOD  Result Value Ref Range Status   Specimen Description BLOOD RIGHT ANTECUBITAL  Final   Special Requests   Final    BOTTLES DRAWN AEROBIC AND ANAEROBIC Blood Culture adequate volume   Culture   Final    NO GROWTH 3 DAYS Performed at [facility], [place] [address], [place], [place] [address]    Report Status PENDING  Incomplete  Culture, blood (routine x 2)     Status: Abnormal   Collection Time: 02/04/21  3:09 PM   Specimen: BLOOD  Result Value Ref Range Status   Specimen Description BLOOD RIGHT ANTECUBITAL  Final   Special Requests   Final    BOTTLES DRAWN AEROBIC AND ANAEROBIC Blood Culture adequate volume   Culture  Setup Time   Final    GRAM POSITIVE COCCI IN CLUSTERS AEROBIC BOTTLE ONLY CRITICAL RESULT CALLED TO, READ BACK BY AND VERIFIED WITH: K HURTH PHARMD 1612 02/05/21 A BROWNING    Culture (A)  Final    STAPHYLOCOCCUS HOMINIS THE SIGNIFICANCE OF ISOLATING THIS ORGANISM FROM A SINGLE SET OF BLOOD CULTURES WHEN MULTIPLE SETS ARE DRAWN IS UNCERTAIN. PLEASE NOTIFY THE MICROBIOLOGY DEPARTMENT WITHIN ONE WEEK IF SPECIATION AND SENSITIVITIES ARE REQUIRED. Performed at [facility], [place] [address], [place], [place] [address]    Report Status 02/06/2021 FINAL  Final  Blood Culture ID Panel (Reflexed)     Status: Abnormal   Collection Time: 02/04/21  3:09 PM  Result Value Ref Range Status   Enterococcus faecalis NOT DETECTED NOT DETECTED Final   Enterococcus Faecium NOT DETECTED NOT DETECTED Final   Listeria monocytogenes NOT DETECTED NOT DETECTED Final   Staphylococcus species DETECTED (A) NOT DETECTED Final    Comment: CRITICAL RESULT CALLED TO, READ BACK BY AND VERIFIED WITH: Lanae Boast PHARMD 1612 02/05/21 A BROWNING    Staphylococcus aureus (BCID) NOT DETECTED NOT DETECTED Final   Staphylococcus epidermidis NOT DETECTED NOT DETECTED Final   Staphylococcus lugdunensis NOT DETECTED  NOT DETECTED Final   Streptococcus species NOT DETECTED NOT DETECTED Final   Streptococcus agalactiae NOT DETECTED NOT DETECTED Final   Streptococcus pneumoniae NOT DETECTED NOT DETECTED Final   Streptococcus pyogenes NOT DETECTED NOT DETECTED Final   A.calcoaceticus-baumannii NOT DETECTED NOT DETECTED  Final   Bacteroides fragilis NOT DETECTED NOT DETECTED Final   Enterobacterales NOT DETECTED NOT DETECTED Final   Enterobacter cloacae complex NOT DETECTED NOT DETECTED Final   Escherichia coli NOT DETECTED NOT DETECTED Final   Klebsiella aerogenes NOT DETECTED NOT DETECTED Final   Klebsiella oxytoca NOT DETECTED NOT DETECTED Final   Klebsiella pneumoniae NOT DETECTED NOT DETECTED Final   Proteus species NOT DETECTED NOT DETECTED Final   Salmonella species NOT DETECTED NOT DETECTED Final   Serratia marcescens NOT DETECTED NOT DETECTED Final   Haemophilus influenzae NOT DETECTED NOT DETECTED Final   Neisseria meningitidis NOT DETECTED NOT DETECTED Final   Pseudomonas aeruginosa NOT DETECTED NOT DETECTED Final   Stenotrophomonas maltophilia NOT DETECTED NOT DETECTED Final   Candida albicans NOT DETECTED NOT DETECTED Final   Candida auris NOT DETECTED NOT DETECTED Final   Candida glabrata NOT DETECTED NOT DETECTED Final   Candida krusei NOT DETECTED NOT DETECTED Final   Candida parapsilosis NOT DETECTED NOT DETECTED Final   Candida tropicalis NOT DETECTED NOT DETECTED Final   Cryptococcus neoformans/gattii NOT DETECTED NOT DETECTED Final    Comment: Performed at [facility], [place] [address], [place], [place] [address]         Radiology Studies: No results found.      Scheduled Meds: . pantoprazole  40 mg Oral BID AC   Continuous Infusions: . sodium chloride 250 mL (02/06/21 2231)  . vancomycin 1,250 mg (02/07/21 0929)     LOS: 8 days       Hosie Poisson, MD [facility]   To contact the attending provider between 7A-7P or the covering provider during  after hours 7P-7A, please log into the web site [email or web address] and access using universal [place] password for that web site. If you do not have the password, please call the hospital operator.  02/07/2021, 3:32 PM

## 2021-02-08 DIAGNOSIS — K5731 Diverticulosis of large intestine without perforation or abscess with bleeding: Secondary | ICD-10-CM | POA: Diagnosis not present

## 2021-02-08 DIAGNOSIS — K922 Gastrointestinal hemorrhage, unspecified: Secondary | ICD-10-CM | POA: Diagnosis not present

## 2021-02-08 LAB — VANCOMYCIN, TROUGH: Vancomycin Tr: 15 ug/mL (ref 15–20)

## 2021-02-08 LAB — TYPE AND SCREEN
ABO/RH(D): O POS
ABO/RH(D): O POS
Antibody Screen: NEGATIVE
Antibody Screen: NEGATIVE
Unit division: 0

## 2021-02-08 LAB — HEMOGLOBIN AND HEMATOCRIT, BLOOD
HCT: 26.4 % — ABNORMAL LOW (ref 39.0–52.0)
Hemoglobin: 8.6 g/dL — ABNORMAL LOW (ref 13.0–17.0)

## 2021-02-08 LAB — CBC
HCT: 24.3 % — ABNORMAL LOW (ref 39.0–52.0)
Hemoglobin: 8.4 g/dL — ABNORMAL LOW (ref 13.0–17.0)
MCH: 30.4 pg (ref 26.0–34.0)
MCHC: 34.6 g/dL (ref 30.0–36.0)
MCV: 88 fL (ref 80.0–100.0)
Platelets: 247 10*3/uL (ref 150–400)
RBC: 2.76 MIL/uL — ABNORMAL LOW (ref 4.22–5.81)
RDW: 17 % — ABNORMAL HIGH (ref 11.5–15.5)
WBC: 5.9 10*3/uL (ref 4.0–10.5)
nRBC: 0 % (ref 0.0–0.2)

## 2021-02-08 LAB — BPAM RBC
Blood Product Expiration Date: 202203122359
ISSUE DATE / TIME: 202202111300
Unit Type and Rh: 5100

## 2021-02-08 NOTE — Progress Notes (Signed)
Pharmacy Antibiotic Note  Jay Robertson is a 65 y.o. male admitted on 01/29/2021 with catheter associated thrombophlebitis.  Pharmacy has been consulted for vancomycin dosing.  Was started on cephalexin. Afebrile, WBC 6.0. Scr 0.96 (CrCl 81.7 mL/min). Was found to have superficial cephalic venus thrombus consistent with catheter associated thrombophlebitis. Patient improving clinically and swelling around catheter site has gone down. Plan per ID is to continue IV therapy while inpatient, will transition to oral linezolid at discharge. Will check vancomycin trough to ensure levels are not supratherapeutic.  Plan: Continue vancomycin 1250 mg IV every 12 hours (estAUC 508) Vancomycin trough scheduled for 2/12 @ 0730  Monitor renal fx, cx results, clinical pic, and plan to transition to oral therapy at discharge  Addendum: Vancomycin trough 15, drawn appropriately. Will continue 1250 mg IV every 12 hours.   Height: 5\' 11"  (180.3 cm) Weight: 75.6 kg (166 lb 11.2 oz) IBW/kg (Calculated) : 75.3  Temp (24hrs), Avg:98.3 F (36.8 C), Min:97.8 F (36.6 C), Max:98.7 F (37.1 C)  Recent Labs  Lab 02/02/21 0644 02/03/21 9794 02/04/21 1912 02/05/21 0926 02/06/21 0307 02/07/21 0142 02/08/21 0708  WBC  --  8.8  --  8.1 6.9 6.0 5.9  CREATININE 0.98 0.80 0.91  --  0.96  --   --   VANCOTROUGH  --   --   --   --   --   --  15    Estimated Creatinine Clearance: 81.7 mL/min (by C-G formula based on SCr of 0.96 mg/dL).    Allergies  Allergen Reactions  . Doxycycline Other (See Comments)    Headaches    Antimicrobials this admission: Cephalexin 2/7 >> 2/8 Vancomycin 2/8 >>   Dose adjustments this admission: N/A  Microbiology results: 2/8 BCx: staph hominis  2/2 COVID PCR: neg  Thank you for allowing pharmacy to be a part of this patient's care.  Romilda Garret, PharmD PGY1 Acute Care Pharmacy Resident 02/08/2021 10:31 AM  Please check [email or web address] for unit specific pharmacy phone  numbers.

## 2021-02-08 NOTE — Progress Notes (Signed)
Pt told nurse that he had a BM around 1135 this AM. He stated he forgot to show [place] the BM and accidentally flushed it. He stated he was black, without clots or frank blood. He does state that every time he wipes, he describes the tissue as having "large smears" of blood. No complaints of syncope. No complaints of SOB or chest pain, or dizziness. Dr. Karleen Hampshire aware of characteristics of stool and pt's desire to follow through with last procedure. Dr. Karleen Hampshire stated she will notify IR.   No other needs or concerns at this time. No signs of distress noted.

## 2021-02-08 NOTE — Progress Notes (Signed)
PROGRESS NOTE    Jay Robertson  WUJ:[id number] DOB: 1956-03-01 DOA: 01/29/2021 PCP: Billie Ruddy, MD   Chief Complaint  Patient presents with  . GI Bleeding    Brief Narrative:  65 year old with prior history of diverticulosis, essential hypertension admitted with bright red blood per rectum with intermittent hematochezia whom we are consulted for ongoing large-volume hematochezia.  He underwent multiple CT angiograms that were negative.  Colonoscopy and upper endoscopy completed on 2 5/22 showed no evidence of bleeding in the upper GI, no evidence of bleed on colonoscopy.  But there was blood throughout the large intestine with diverticulosis. Tagged RBC scan on 02/02/2021 was unrevealing.  Patient underwent 9  units of PRBC transfusions so far and repeat hemoglobin this morning is around 7.4.  One BM yesterday around 11 am and it had blood clots as per the patient.   IR consulted for embolization, recommended Meckel's scan this am and if negative will need follow up with mesenteric angiogram and possible heparin/TPA challenge. Surgery consulted just in case bowel resection is required. But patient initially refused any further work up, wanted to wait to see if his bleeding will stop.  But after 48 hours after 1 unit of prbc transfusion , he continues to bleed. He is agreeable to further work up .  IR reconsulted for Meckel's scan.   Assessment & Plan:   Principal Problem:   Acute lower GI bleeding Active Problems:   Essential hypertension   Syncope   GI bleed   Diverticulosis of colon with hemorrhage   GI bleed/diverticular bleed: He underwent multiple CT angiogram of the abdomen that were negative.  Colonoscopy and upper endoscopy completed on 2/ 5/22 showed no evidence of bleeding in the upper GI, no evidence of bleed on colonoscopy.  But there was blood throughout the large intestine with diverticulosis. Tagged RBC scan on 02/02/2021 was unrevealing.  Patient underwent 9  units of  PRBC transfusions Clear liquid diet and advance as tolerated. He is currently on regular diet.  IR consulted  recommended Meckel's scan this am and if negative will need follow up with mesenteric angiogram and possible heparin/TPA challenge. Surgery consulted just in case bowel resection is required.   Initially patient refused Meckel's scan and intervention and wants to wait and see if his bleeding stops. IR, GI aware of the plan.  Over the last 2 days pt continues to have bowel movements with dark blood, dark stools and stools with bright blood.   IR reconsulted.  Gastric biopsies were positive for H pylori. on discharge and after bleeding stops he will need prescriptions  for 14 days: protonix 40mg  BID Amoxicillin 1gm BID Clarithromycin 500mg  BID Flagyl 500mg  BID    Essential Hypertension;  Blood pressure parameters are optimal   Superficial Thrombophlebitis: on IV vancomycin .  Blood cultures ordered, one bottle out of 4 , positive for staph Hominis ID on board and recommended stay on vancomycin while inpatient and will continue with linezolid at discharge for 2 weeks from now.  If he remains inpatient for further interventions, he can stay on vancomycin and take linezolid through 2/24.    Echocardiogram ordered. It shows Left ventricular ejection fraction, by estimation, is 60  to 65%. The left ventricle has normal function. The left ventricle has no  regional wall motion abnormalities. The left ventricular internal cavity size was normal in size.    DVT prophylaxis: SCDs Code Status: Full code Family Communication: None at bedside Disposition:   Status  is: Inpatient  Remains inpatient appropriate because:Ongoing diagnostic testing needed not appropriate for outpatient work up, Unsafe d/c plan and Inpatient level of care appropriate due to severity of illness   Dispo: The patient is from: Home              Anticipated d/c is to: Home              Anticipated d/c date is:  2 days              Patient currently is not medically stable to d/c.   Difficult to place patient No       Level of care: Progressive Consultants:   PCCM  Gastroenterology  Infectious disease  IR  Procedures: EGD Colonoscopy   Antimicrobials: None   Subjective: No dizziness, no chest pain or shortness of breath, no nausea vomiting or diarrhea.  Objective: Vitals:   02/08/21 0400 02/08/21 0546 02/08/21 0552 02/08/21 1252  BP: (!) 121/100  109/71 123/79  Pulse:   79 89  Resp: 18  18 18   Temp:  98 F (36.7 C) 98.7 F (37.1 C) 98.3 F (36.8 C)  TempSrc: Oral Oral Oral Oral  SpO2: 100%   100%  Weight:      Height:        Intake/Output Summary (Last 24 hours) at 02/08/2021 1340 Last data filed at 02/08/2021 0700 Gross per 24 hour  Intake 580.17 ml  Output 820 ml  Net -239.83 ml   Filed Weights   02/05/21 0030 02/06/21 0335 02/07/21 0505  Weight: 78.6 kg 76.4 kg 75.6 kg    Examination:  General exam: Alert and comfortable, not in any kind of distress Respiratory system: Clear to auscultation bilaterally, no wheezing or rhonchi Cardiovascular system: S1-S2 heard, regular rate rhythm, no JVD Gastrointestinal system: Abdomen is soft nontender bowel sounds normal Central nervous system: Alert and oriented, grossly nonfocal Extremities: No pedal edema Skin: No rashes seen Psychiatry: Mood is appropriate    Data Reviewed: I have personally reviewed following labs and imaging studies  CBC: Recent Labs  Lab 02/03/21 0613 02/03/21 1559 02/05/21 0926 02/05/21 1901 02/06/21 0307 02/06/21 1757 02/07/21 0142 02/07/21 1733 02/08/21 0708  WBC 8.8  --  8.1  --  6.9  --  6.0  --  5.9  HGB 6.5*   < > 8.2*   < > 7.4* 7.5* 7.1* 8.4* 8.4*  HCT 19.2*   < > 25.0*   < > 21.4* 21.6* 20.2* 25.2* 24.3*  MCV 89.7  --  89.6  --  87.0  --  87.8  --  88.0  PLT 127*  --  154  --  166  --  203  --  247   < > = values in this interval not displayed.    Basic  Metabolic Panel: Recent Labs  Lab 02/02/21 0644 02/03/21 0613 02/04/21 1912 02/06/21 0307  NA 138 137 135 136  K 3.8 3.7 3.6 3.9  CL 109 107 107 105  CO2 21* 23 19* 22  GLUCOSE 84 83 115* 92  BUN 14 11 12 10   CREATININE 0.98 0.80 0.91 0.96  CALCIUM 7.5* 7.2* 7.7* 8.1*    GFR: Estimated Creatinine Clearance: 81.7 mL/min (by C-G formula based on SCr of 0.96 mg/dL).  Liver Function Tests: No results for input(s): AST, ALT, ALKPHOS, BILITOT, PROT, ALBUMIN in the last 168 hours.  CBG: Recent Labs  Lab 02/04/21 1906  GLUCAP 96     Recent Results (from  the past 240 hour(s))  SARS CORONAVIRUS 2 (TAT 6-24 HRS) Nasopharyngeal Nasopharyngeal Swab     Status: None   Collection Time: 01/29/21  8:00 PM   Specimen: Nasopharyngeal Swab  Result Value Ref Range Status   SARS Coronavirus 2 NEGATIVE NEGATIVE Final    Comment: (NOTE) SARS-CoV-2 target nucleic acids are NOT DETECTED.  The SARS-CoV-2 RNA is generally detectable in upper and lower respiratory specimens during the acute phase of infection. Negative results do not preclude SARS-CoV-2 infection, do not rule out co-infections with other pathogens, and should not be used as the sole basis for treatment or other patient management decisions. Negative results must be combined with clinical observations, patient history, and epidemiological information. The expected result is Negative.  Fact Sheet for Patients: SugarRoll.be  Fact Sheet for Healthcare Providers: https://www.woods-mathews.com/  This test is not yet approved or cleared by the [place] FDA and  has been authorized for detection and/or diagnosis of SARS-CoV-2 by FDA under an Emergency Use Authorization (EUA). This EUA will remain  in effect (meaning this test can be used) for the duration of the COVID-19 declaration under Se ction 564(b)(1) of the Act, 21 U.S.C. section 360bbb-3(b)(1), unless the authorization is  terminated or revoked sooner.  Performed at [facility], [place] [address] [place], [place] [address]   Culture, blood (routine x 2)     Status: None (Preliminary result)   Collection Time: 02/04/21  3:08 PM   Specimen: BLOOD  Result Value Ref Range Status   Specimen Description BLOOD RIGHT ANTECUBITAL  Final   Special Requests   Final    BOTTLES DRAWN AEROBIC AND ANAEROBIC Blood Culture adequate volume   Culture   Final    NO GROWTH 3 DAYS Performed at [facility], [place] [address], [place], [place] [address]    Report Status PENDING  Incomplete  Culture, blood (routine x 2)     Status: Abnormal   Collection Time: 02/04/21  3:09 PM   Specimen: BLOOD  Result Value Ref Range Status   Specimen Description BLOOD RIGHT ANTECUBITAL  Final   Special Requests   Final    BOTTLES DRAWN AEROBIC AND ANAEROBIC Blood Culture adequate volume   Culture  Setup Time   Final    GRAM POSITIVE COCCI IN CLUSTERS AEROBIC BOTTLE ONLY CRITICAL RESULT CALLED TO, READ BACK BY AND VERIFIED WITH: K HURTH PHARMD 1612 02/05/21 A BROWNING    Culture (A)  Final    STAPHYLOCOCCUS HOMINIS THE SIGNIFICANCE OF ISOLATING THIS ORGANISM FROM A SINGLE SET OF BLOOD CULTURES WHEN MULTIPLE SETS ARE DRAWN IS UNCERTAIN. PLEASE NOTIFY THE MICROBIOLOGY DEPARTMENT WITHIN ONE WEEK IF SPECIATION AND SENSITIVITIES ARE REQUIRED. Performed at [facility], [place] [address] [place], [place] [address]    Report Status 02/06/2021 FINAL  Final  Blood Culture ID Panel (Reflexed)     Status: Abnormal   Collection Time: 02/04/21  3:09 PM  Result Value Ref Range Status   Enterococcus faecalis NOT DETECTED NOT DETECTED Final   Enterococcus Faecium NOT DETECTED NOT DETECTED Final   Listeria monocytogenes NOT DETECTED NOT DETECTED Final   Staphylococcus species DETECTED (A) NOT DETECTED Final    Comment: CRITICAL RESULT CALLED TO, READ BACK BY AND VERIFIED WITH: K HURTH PHARMD 1612 02/05/21 A BROWNING    Staphylococcus  aureus (BCID) NOT DETECTED NOT DETECTED Final   Staphylococcus epidermidis NOT DETECTED NOT DETECTED Final   Staphylococcus lugdunensis NOT DETECTED NOT DETECTED Final  Streptococcus species NOT DETECTED NOT DETECTED Final   Streptococcus agalactiae NOT DETECTED NOT DETECTED Final   Streptococcus pneumoniae NOT DETECTED NOT DETECTED Final   Streptococcus pyogenes NOT DETECTED NOT DETECTED Final   A.calcoaceticus-baumannii NOT DETECTED NOT DETECTED Final   Bacteroides fragilis NOT DETECTED NOT DETECTED Final   Enterobacterales NOT DETECTED NOT DETECTED Final   Enterobacter cloacae complex NOT DETECTED NOT DETECTED Final   Escherichia coli NOT DETECTED NOT DETECTED Final   Klebsiella aerogenes NOT DETECTED NOT DETECTED Final   Klebsiella oxytoca NOT DETECTED NOT DETECTED Final   Klebsiella pneumoniae NOT DETECTED NOT DETECTED Final   Proteus species NOT DETECTED NOT DETECTED Final   Salmonella species NOT DETECTED NOT DETECTED Final   Serratia marcescens NOT DETECTED NOT DETECTED Final   Haemophilus influenzae NOT DETECTED NOT DETECTED Final   Neisseria meningitidis NOT DETECTED NOT DETECTED Final   Pseudomonas aeruginosa NOT DETECTED NOT DETECTED Final   Stenotrophomonas maltophilia NOT DETECTED NOT DETECTED Final   Candida albicans NOT DETECTED NOT DETECTED Final   Candida auris NOT DETECTED NOT DETECTED Final   Candida glabrata NOT DETECTED NOT DETECTED Final   Candida krusei NOT DETECTED NOT DETECTED Final   Candida parapsilosis NOT DETECTED NOT DETECTED Final   Candida tropicalis NOT DETECTED NOT DETECTED Final   Cryptococcus neoformans/gattii NOT DETECTED NOT DETECTED Final    Comment: Performed at [facility], [address], [place], [place] [address]         Radiology Studies: No results found.      Scheduled Meds: . pantoprazole  40 mg Oral BID AC   Continuous Infusions: . sodium chloride 250 mL (02/06/21 2231)  . vancomycin 1,250 mg (02/08/21 0831)      LOS: 9 days       Hosie Poisson, MD [facility]   To contact the attending provider between 7A-7P or the covering provider during after hours 7P-7A, please log into the web site [email or web address] and access using universal [place] password for that web site. If you do not have the password, please call the hospital operator.  02/08/2021, 1:40 PM

## 2021-02-09 ENCOUNTER — Encounter: Payer: Self-pay | Admitting: Gastroenterology

## 2021-02-09 DIAGNOSIS — K5731 Diverticulosis of large intestine without perforation or abscess with bleeding: Secondary | ICD-10-CM | POA: Diagnosis not present

## 2021-02-09 DIAGNOSIS — K922 Gastrointestinal hemorrhage, unspecified: Secondary | ICD-10-CM | POA: Diagnosis not present

## 2021-02-09 LAB — CBC
HCT: 22.6 % — ABNORMAL LOW (ref 39.0–52.0)
Hemoglobin: 7.9 g/dL — ABNORMAL LOW (ref 13.0–17.0)
MCH: 30.7 pg (ref 26.0–34.0)
MCHC: 35 g/dL (ref 30.0–36.0)
MCV: 87.9 fL (ref 80.0–100.0)
Platelets: 284 10*3/uL (ref 150–400)
RBC: 2.57 MIL/uL — ABNORMAL LOW (ref 4.22–5.81)
RDW: 17.2 % — ABNORMAL HIGH (ref 11.5–15.5)
WBC: 5.3 10*3/uL (ref 4.0–10.5)
nRBC: 0 % (ref 0.0–0.2)

## 2021-02-09 LAB — CULTURE, BLOOD (ROUTINE X 2)
Culture: NO GROWTH
Special Requests: ADEQUATE

## 2021-02-09 LAB — BASIC METABOLIC PANEL
Anion gap: 8 (ref 5–15)
BUN: 7 mg/dL — ABNORMAL LOW (ref 8–23)
CO2: 24 mmol/L (ref 22–32)
Calcium: 8.3 mg/dL — ABNORMAL LOW (ref 8.9–10.3)
Chloride: 106 mmol/L (ref 98–111)
Creatinine, Ser: 0.83 mg/dL (ref 0.61–1.24)
GFR, Estimated: >60 mL/min (ref >60)
Glucose, Bld: 100 mg/dL — ABNORMAL HIGH (ref 70–99)
Potassium: 3.8 mmol/L (ref 3.5–5.1)
Sodium: 138 mmol/L (ref 135–145)

## 2021-02-09 LAB — HEMOGLOBIN AND HEMATOCRIT, BLOOD
HCT: 24.2 % — ABNORMAL LOW (ref 39.0–52.0)
Hemoglobin: 7.9 g/dL — ABNORMAL LOW (ref 13.0–17.0)

## 2021-02-09 NOTE — Progress Notes (Signed)
   02/09/21 1342  Clinical Encounter Type  Visited With Patient  Visit Type Initial  Referral From Nurse;Patient  Spiritual Encounters  Spiritual Needs Literature   Chaplain responded to a request from the patient to complete an advanced directive. Patient has form completed and had no questions. Chaplain informed the patient that because we did not have the staff available on the weekend, it would have to wait. Chaplain will refer to oncoming chaplains for Monday to follow up. Chaplain introduced spiritual care services. Spiritual care services available as needed.   Jeri Lager, Chaplain

## 2021-02-09 NOTE — Progress Notes (Signed)
PROGRESS NOTE    Jay Robertson  PNT:[id number] DOB: 04/13/1956 DOA: 01/29/2021 PCP: Billie Ruddy, MD   Chief Complaint  Patient presents with  . GI Bleeding    Brief Narrative:  65 year old with prior history of diverticulosis, essential hypertension admitted with bright red blood per rectum with intermittent hematochezia whom we are consulted for ongoing large-volume hematochezia.  He underwent multiple CT angiograms that were negative.  Colonoscopy and upper endoscopy completed on 2 5/22 showed no evidence of bleeding in the upper GI, no evidence of bleed on colonoscopy.  But there was blood throughout the large intestine with diverticulosis. Tagged RBC scan on 02/02/2021 was unrevealing.  Patient underwent 9  units of PRBC transfusions so far and repeat hemoglobin this morning is around 7.4.  One BM yesterday around 11 am and it had blood clots as per the patient.   IR consulted for embolization, recommended Meckel's scan this am and if negative will need follow up with mesenteric angiogram and possible heparin/TPA challenge. Surgery consulted just in case bowel resection is required. But patient initially refused any further work up, wanted to wait to see if his bleeding will stop.  But after 48 hours after 1 unit of prbc transfusion , he continues to bleed. He is agreeable to further work up .  IR reconsulted for Meckel's scan. Patient continues to have smears of blood during bowel movements.   Assessment & Plan:   Principal Problem:   Acute lower GI bleeding Active Problems:   Essential hypertension   Syncope   GI bleed   Diverticulosis of colon with hemorrhage   GI bleed/diverticular bleed: He underwent multiple CT angiogram of the abdomen that were negative.  Colonoscopy and upper endoscopy completed on 2/ 5/22 showed no evidence of bleeding in the upper GI, no evidence of bleed on colonoscopy.  But there was blood throughout the large intestine with diverticulosis. Tagged RBC  scan on 02/02/2021 was unrevealing.  Patient underwent a total of 10  units of PRBC transfusions Clear liquid diet and advance as tolerated. He is currently on regular diet.  IR consulted  recommended Meckel's scan this am and if negative will need follow up with mesenteric angiogram and possible heparin/TPA challenge. Surgery consulted just in case bowel resection is required.   Initially patient refused Meckel's scan and intervention and wants to wait and see if his bleeding stops. IR, GI aware of the plan.  Over the last 2 days pt continues to have bowel movements with dark blood, dark stools and stools with bright blood.   IR reconsulted. Hemoglobin around 7.9 this am,. Repeat H&H in am.   Gastric biopsies were positive for H pylori. on discharge and after bleeding stops he will need prescriptions  for 14 days: protonix 40mg  BID Amoxicillin 1gm BID Clarithromycin 500mg  BID Flagyl 500mg  BID    Essential Hypertension;  Blood pressure parameters are optimal.    Superficial Thrombophlebitis: on IV vancomycin .  Blood cultures ordered, one bottle out of 4 , positive for staph Hominis ID on board and recommended stay on vancomycin while inpatient and will continue with linezolid at discharge for 2 weeks from now.  If he remains inpatient for further interventions, he can stay on vancomycin and take linezolid through 2/24.    Echocardiogram ordered. It shows Left ventricular ejection fraction, by estimation, is 60  to 65%. The left ventricle has normal function. The left ventricle has no  regional wall motion abnormalities. The left ventricular internal cavity  size was normal in size.    DVT prophylaxis: SCDs Code Status: Full code Family Communication: None at bedside Disposition:   Status is: Inpatient  Remains inpatient appropriate because:Ongoing diagnostic testing needed not appropriate for outpatient work up, Unsafe d/c plan and Inpatient level of care appropriate due to  severity of illness   Dispo: The patient is from: Home              Anticipated d/c is to: Home              Anticipated d/c date is: 2 days              Patient currently is not medically stable to d/c.   Difficult to place patient No       Level of care: Progressive Consultants:   PCCM  Gastroenterology  Infectious disease  IR  Procedures: EGD Colonoscopy   Antimicrobials: None   Subjective: No dizziness, no chest pain or sob.  No nausea, vomiting and abdominal pain.  Only smears of bright red blood.  Objective: Vitals:   02/09/21 0000 02/09/21 0428 02/09/21 0805 02/09/21 1125  BP:  132/87 117/70 120/72  Pulse:  71 81 80  Resp:  18 20 18   Temp:  98.2 F (36.8 C) 98.9 F (37.2 C) 98.6 F (37 C)  TempSrc:  Oral Oral Oral  SpO2:  100% 100% 100%  Weight: 75.7 kg     Height:        Intake/Output Summary (Last 24 hours) at 02/09/2021 1457 Last data filed at 02/09/2021 0807 Gross per 24 hour  Intake --  Output 475 ml  Net -475 ml   Filed Weights   02/06/21 0335 02/07/21 0505 02/09/21 0000  Weight: 76.4 kg 75.6 kg 75.7 kg    Examination:  General exam: alert and comfortable. Not in distress.  Respiratory system: Air entry fair, no wheezing heard.  Cardiovascular system: S1S2 heard, RRR  Gastrointestinal system: Abdomen is SOFT, NT ND BS+ Central nervous system: Alert and oriented non focal. Extremities:No pedal edema.  Skin: No rashes seen.  Psychiatry: Mood is appropriate    Data Reviewed: I have personally reviewed following labs and imaging studies  CBC: Recent Labs  Lab 02/05/21 0926 02/05/21 1901 02/06/21 0307 02/06/21 1757 02/07/21 0142 02/07/21 1733 02/08/21 0708 02/08/21 1744 02/09/21 0619  WBC 8.1  --  6.9  --  6.0  --  5.9  --  5.3  HGB 8.2*   < > 7.4*   < > 7.1* 8.4* 8.4* 8.6* 7.9*  HCT 25.0*   < > 21.4*   < > 20.2* 25.2* 24.3* 26.4* 22.6*  MCV 89.6  --  87.0  --  87.8  --  88.0  --  87.9  PLT 154  --  166  --  203  --   247  --  284   < > = values in this interval not displayed.    Basic Metabolic Panel: Recent Labs  Lab 02/03/21 0613 02/04/21 1912 02/06/21 0307 02/09/21 0619  NA 137 135 136 138  K 3.7 3.6 3.9 3.8  CL 107 107 105 106  CO2 23 19* 22 24  GLUCOSE 83 115* 92 100*  BUN 11 12 10  7*  CREATININE 0.80 0.91 0.96 0.83  CALCIUM 7.2* 7.7* 8.1* 8.3*    GFR: Estimated Creatinine Clearance: 94.5 mL/min (by C-G formula based on SCr of 0.83 mg/dL).  Liver Function Tests: No results for input(s): AST, ALT, ALKPHOS, BILITOT, PROT,  ALBUMIN in the last 168 hours.  CBG: Recent Labs  Lab 02/04/21 1906  GLUCAP 96     Recent Results (from the past 240 hour(s))  Culture, blood (routine x 2)     Status: None (Preliminary result)   Collection Time: 02/04/21  3:08 PM   Specimen: BLOOD  Result Value Ref Range Status   Specimen Description BLOOD RIGHT ANTECUBITAL  Final   Special Requests   Final    BOTTLES DRAWN AEROBIC AND ANAEROBIC Blood Culture adequate volume   Culture   Final    NO GROWTH 4 DAYS Performed at [facility], [place] [address] [place], [place] [address]    Report Status PENDING  Incomplete  Culture, blood (routine x 2)     Status: Abnormal   Collection Time: 02/04/21  3:09 PM   Specimen: BLOOD  Result Value Ref Range Status   Specimen Description BLOOD RIGHT ANTECUBITAL  Final   Special Requests   Final    BOTTLES DRAWN AEROBIC AND ANAEROBIC Blood Culture adequate volume   Culture  Setup Time   Final    GRAM POSITIVE COCCI IN CLUSTERS AEROBIC BOTTLE ONLY CRITICAL RESULT CALLED TO, READ BACK BY AND VERIFIED WITH: K HURTH PHARMD 1612 02/05/21 A BROWNING    Culture (A)  Final    STAPHYLOCOCCUS HOMINIS THE SIGNIFICANCE OF ISOLATING THIS ORGANISM FROM A SINGLE SET OF BLOOD CULTURES WHEN MULTIPLE SETS ARE DRAWN IS UNCERTAIN. PLEASE NOTIFY THE MICROBIOLOGY DEPARTMENT WITHIN ONE WEEK IF SPECIATION AND SENSITIVITIES ARE REQUIRED. Performed at [facility], [place] [address], [place], [place] [address]    Report Status 02/06/2021 FINAL  Final  Blood Culture ID Panel (Reflexed)     Status: Abnormal   Collection Time: 02/04/21  3:09 PM  Result Value Ref Range Status   Enterococcus faecalis NOT DETECTED NOT DETECTED Final   Enterococcus Faecium NOT DETECTED NOT DETECTED Final   Listeria monocytogenes NOT DETECTED NOT DETECTED Final   Staphylococcus species DETECTED (A) NOT DETECTED Final    Comment: CRITICAL RESULT CALLED TO, READ BACK BY AND VERIFIED WITH: Lanae Boast PHARMD 1612 02/05/21 A BROWNING    Staphylococcus aureus (BCID) NOT DETECTED NOT DETECTED Final   Staphylococcus epidermidis NOT DETECTED NOT DETECTED Final   Staphylococcus lugdunensis NOT DETECTED NOT DETECTED Final   Streptococcus species NOT DETECTED NOT DETECTED Final   Streptococcus agalactiae NOT DETECTED NOT DETECTED Final   Streptococcus pneumoniae NOT DETECTED NOT DETECTED Final   Streptococcus pyogenes NOT DETECTED NOT DETECTED Final   A.calcoaceticus-baumannii NOT DETECTED NOT DETECTED Final   Bacteroides fragilis NOT DETECTED NOT DETECTED Final   Enterobacterales NOT DETECTED NOT DETECTED Final   Enterobacter cloacae complex NOT DETECTED NOT DETECTED Final   Escherichia coli NOT DETECTED NOT DETECTED Final   Klebsiella aerogenes NOT DETECTED NOT DETECTED Final   Klebsiella oxytoca NOT DETECTED NOT DETECTED Final   Klebsiella pneumoniae NOT DETECTED NOT DETECTED Final   Proteus species NOT DETECTED NOT DETECTED Final   Salmonella species NOT DETECTED NOT DETECTED Final   Serratia marcescens NOT DETECTED NOT DETECTED Final   Haemophilus influenzae NOT DETECTED NOT DETECTED Final   Neisseria meningitidis NOT DETECTED NOT DETECTED Final   Pseudomonas aeruginosa NOT DETECTED NOT DETECTED Final   Stenotrophomonas maltophilia NOT DETECTED NOT DETECTED Final   Candida albicans NOT DETECTED NOT DETECTED Final   Candida auris NOT DETECTED NOT DETECTED Final   Candida glabrata NOT  DETECTED NOT DETECTED Final   Candida krusei  NOT DETECTED NOT DETECTED Final   Candida parapsilosis NOT DETECTED NOT DETECTED Final   Candida tropicalis NOT DETECTED NOT DETECTED Final   Cryptococcus neoformans/gattii NOT DETECTED NOT DETECTED Final    Comment: Performed at [facility], [place] [address], [place], [place] [address]         Radiology Studies: No results found.      Scheduled Meds: . pantoprazole  40 mg Oral BID AC   Continuous Infusions: . sodium chloride 250 mL (02/06/21 2231)  . vancomycin 1,250 mg (02/09/21 0853)     LOS: 10 days       Hosie Poisson, MD [facility]   To contact the attending provider between 7A-7P or the covering provider during after hours 7P-7A, please log into the web site [email or web address] and access using universal [place] password for that web site. If you do not have the password, please call the hospital operator.  02/09/2021, 2:57 PM

## 2021-02-09 NOTE — Plan of Care (Signed)

## 2021-02-10 ENCOUNTER — Inpatient Hospital Stay (HOSPITAL_COMMUNITY): Payer: Medicare Other

## 2021-02-10 DIAGNOSIS — K5731 Diverticulosis of large intestine without perforation or abscess with bleeding: Principal | ICD-10-CM

## 2021-02-10 LAB — HEMOGLOBIN AND HEMATOCRIT, BLOOD
HCT: 26 % — ABNORMAL LOW (ref 39.0–52.0)
Hemoglobin: 8.6 g/dL — ABNORMAL LOW (ref 13.0–17.0)

## 2021-02-10 MED ORDER — CLARITHROMYCIN 500 MG PO TABS: 500.0000 mg | ORAL_TABLET | Freq: Two times a day (BID) | ORAL | 0 refills | Status: AC

## 2021-02-10 MED ORDER — METRONIDAZOLE 500 MG PO TABS: 500.0000 mg | ORAL_TABLET | Freq: Two times a day (BID) | ORAL | 0 refills | Status: AC

## 2021-02-10 MED ORDER — AMOXICILLIN 500 MG PO TABS: 1000.0000 mg | ORAL_TABLET | Freq: Two times a day (BID) | ORAL | 0 refills | Status: AC

## 2021-02-10 MED ORDER — PANTOPRAZOLE SODIUM 40 MG PO TBEC: 40.0000 mg | DELAYED_RELEASE_TABLET | Freq: Two times a day (BID) | ORAL | 0 refills | Status: DC

## 2021-02-10 NOTE — Progress Notes (Signed)
Interventional Radiology Brief Note:  Patient followed by surgery, GI, and IR for ongoing GI bleed without localization. Plan was made for NM study with possible IR angiogram on Friday, however patient refused.  He has not had any further bleeding. HgB 7.9.  Surgery and GI have both signed off.   No plans for IR procedure at this time.  Please re-consult if needed.   Brynda Greathouse, MS RD PA-C

## 2021-02-10 NOTE — Progress Notes (Signed)
PROGRESS NOTE    Jay Robertson  OQH:[id number] DOB: [birth year] DOA: 01/29/2021 PCP: Billie Ruddy, MD   Chief Complaint  Patient presents with  . GI Bleeding    Brief Narrative:  65 year old with prior history of diverticulosis, essential hypertension admitted with bright red blood per rectum with intermittent hematochezia whom we are consulted for ongoing large-volume hematochezia.  He underwent multiple CT angiograms that were negative.  Colonoscopy and upper endoscopy completed on 2 5/22 showed no evidence of bleeding in the upper GI, no evidence of bleed on colonoscopy.  But there was blood throughout the large intestine with diverticulosis. Tagged RBC scan on 02/02/2021 was unrevealing.  Patient underwent 9  units of PRBC transfusions so far and repeat hemoglobin this morning is around 7.4.  One BM yesterday around 11 am and it had blood clots as per the patient.   IR consulted for embolization, recommended Meckel's scan this am and if negative will need follow up with mesenteric angiogram and possible heparin/TPA challenge. Surgery consulted just in case bowel resection is required. But patient initially refused any further work up, wanted to wait to see if his bleeding will stop.  He denies any bloody bowel bowel movements overnight and his hemoglobin improved to 8.6. General surgery recommended no further work up.  IR and gen surgery signed off.   Assessment & Plan:   Principal Problem:   Acute lower GI bleeding Active Problems:   Essential hypertension   Syncope   GI bleed   Diverticulosis of colon with hemorrhage   GI bleed/diverticular bleed: He underwent multiple CT angiogram of the abdomen that were negative.  Colonoscopy and upper endoscopy completed on 2/ 5/22 showed no evidence of bleeding in the upper GI, no evidence of bleed on colonoscopy.  But there was blood throughout the large intestine with diverticulosis. Tagged RBC scan on 02/02/2021 was unrevealing.  Patient  underwent a total of 10  units of PRBC transfusions Clear liquid diet and advance as tolerated. He is currently on regular diet.  IR consulted  recommended Meckel's scan this am and if negative will need follow up with mesenteric angiogram and possible heparin/TPA challenge. Surgery consulted just in case bowel resection is required.   Initially patient refused Meckel's scan and intervention and wants to wait and see if his bleeding stops. IR, GI aware of the plan.    No bloody bowel movements in  the last 24 hours and hemoglobin stable around 8.     Gastric biopsies were positive for H pylori. on discharge and after bleeding stops he will need prescriptions  for 14 days: protonix 40mg  BID Amoxicillin 1gm BID Clarithromycin 500mg  BID Flagyl 500mg  BID    Essential Hypertension;  Blood pressure parameters are optimal.    Superficial Thrombophlebitis: on IV vancomycin .  Blood cultures ordered, one bottle out of 4 , positive for staph Hominis ID on board and recommended stay on vancomycin while inpatient and will continue with linezolid at discharge for 2 weeks from now.  If he remains inpatient for further interventions, he can stay on vancomycin and take linezolid through 2/24.    Echocardiogram ordered. It shows Left ventricular ejection fraction, by estimation, is 60  to 65%. The left ventricle has normal function. The left ventricle has no  regional wall motion abnormalities. The left ventricular internal cavity size was normal in size.    Left ankle swelling, reports it started when he fell.  X rays show Possible small avulsion fracture at  the dorsal proximal margin of the tarsal navicular, only seen on lateral view.  Discussed with Dr Lucia Gaskins, recommended boot if painful and if able to walk and tolerate, recommend outpatient follow up in the office.     DVT prophylaxis: SCDs Code Status: Full code Family Communication: None at bedside Disposition:   Status is:  Inpatient  Remains inpatient appropriate because:Ongoing diagnostic testing needed not appropriate for outpatient work up, Unsafe d/c plan and Inpatient level of care appropriate due to severity of illness   Dispo: The patient is from: Home              Anticipated d/c is to: Home              Anticipated d/c date is: 2 days              Patient currently is not medically stable to d/c.   Difficult to place patient No       Level of care: Progressive Consultants:   PCCM  Gastroenterology  Infectious disease  IR  Procedures: EGD Colonoscopy   Antimicrobials: None   Subjective: No bloody bowel movement. Overnight.  Left ankle swelling.   Objective: Vitals:   02/09/21 2354 02/10/21 0651 02/10/21 0722 02/10/21 1209  BP: 113/76 118/75 108/80 126/83  Pulse: 66 69 75 71  Resp: 14 15 17 17   Temp: 98.6 F (37 C) 98 F (36.7 C) 98.8 F (37.1 C) (!) 97.4 F (36.3 C)  TempSrc: Oral Oral Oral Oral  SpO2: 100% 100% 99% 100%  Weight: 75.2 kg     Height:        Intake/Output Summary (Last 24 hours) at 02/10/2021 1736 Last data filed at 02/10/2021 1211 Gross per 24 hour  Intake 420 ml  Output 325 ml  Net 95 ml   Filed Weights   02/07/21 0505 02/09/21 0000 02/09/21 2354  Weight: 75.6 kg 75.7 kg 75.2 kg    Examination:  General exam: Alert, not in distress.  Respiratory system: Air entry fair no wheezing heard.  Cardiovascular system: S1S2 heard, RRR  Gastrointestinal system: Abdomen is soft, non tender  bowel sounds wnl.  Central nervous system: Alert and oriented , non focal Extremities: left ankle edema,non tender .  Skin: No rashes seen.  Psychiatry: slightly anxious.    Data Reviewed: I have personally reviewed following labs and imaging studies  CBC: Recent Labs  Lab 02/05/21 0926 02/05/21 1901 02/06/21 0307 02/06/21 1757 02/07/21 0142 02/07/21 1733 02/08/21 0708 02/08/21 1744 02/09/21 0619 02/09/21 1934 02/10/21 0829  WBC 8.1  --  6.9   --  6.0  --  5.9  --  5.3  --   --   HGB 8.2*   < > 7.4*   < > 7.1*   < > 8.4* 8.6* 7.9* 7.9* 8.6*  HCT 25.0*   < > 21.4*   < > 20.2*   < > 24.3* 26.4* 22.6* 24.2* 26.0*  MCV 89.6  --  87.0  --  87.8  --  88.0  --  87.9  --   --   PLT 154  --  166  --  203  --  247  --  284  --   --    < > = values in this interval not displayed.    Basic Metabolic Panel: Recent Labs  Lab 02/04/21 1912 02/06/21 0307 02/09/21 0619  NA 135 136 138  K 3.6 3.9 3.8  CL 107 105 106  CO2 19*  22 24  GLUCOSE 115* 92 100*  BUN 12 10 7*  CREATININE 0.91 0.96 0.83  CALCIUM 7.7* 8.1* 8.3*    GFR: Estimated Creatinine Clearance: 94.4 mL/min (by C-G formula based on SCr of 0.83 mg/dL).  Liver Function Tests: No results for input(s): AST, ALT, ALKPHOS, BILITOT, PROT, ALBUMIN in the last 168 hours.  CBG: Recent Labs  Lab 02/04/21 1906  GLUCAP 96     Recent Results (from the past 240 hour(s))  Culture, blood (routine x 2)     Status: None   Collection Time: 02/04/21  3:08 PM   Specimen: BLOOD  Result Value Ref Range Status   Specimen Description BLOOD RIGHT ANTECUBITAL  Final   Special Requests   Final    BOTTLES DRAWN AEROBIC AND ANAEROBIC Blood Culture adequate volume   Culture   Final    NO GROWTH 5 DAYS Performed at [facility], [address], [place], [place] [address]    Report Status 02/09/2021 FINAL  Final  Culture, blood (routine x 2)     Status: Abnormal   Collection Time: 02/04/21  3:09 PM   Specimen: BLOOD  Result Value Ref Range Status   Specimen Description BLOOD RIGHT ANTECUBITAL  Final   Special Requests   Final    BOTTLES DRAWN AEROBIC AND ANAEROBIC Blood Culture adequate volume   Culture  Setup Time   Final    GRAM POSITIVE COCCI IN CLUSTERS AEROBIC BOTTLE ONLY CRITICAL RESULT CALLED TO, READ BACK BY AND VERIFIED WITH: K HURTH PHARMD 1612 02/05/21 A BROWNING    Culture (A)  Final    STAPHYLOCOCCUS HOMINIS THE SIGNIFICANCE OF ISOLATING THIS ORGANISM FROM A SINGLE  SET OF BLOOD CULTURES WHEN MULTIPLE SETS ARE DRAWN IS UNCERTAIN. PLEASE NOTIFY THE MICROBIOLOGY DEPARTMENT WITHIN ONE WEEK IF SPECIATION AND SENSITIVITIES ARE REQUIRED. Performed at [facility], [place] [address], [place], [place] [address]    Report Status 02/06/2021 FINAL  Final  Blood Culture ID Panel (Reflexed)     Status: Abnormal   Collection Time: 02/04/21  3:09 PM  Result Value Ref Range Status   Enterococcus faecalis NOT DETECTED NOT DETECTED Final   Enterococcus Faecium NOT DETECTED NOT DETECTED Final   Listeria monocytogenes NOT DETECTED NOT DETECTED Final   Staphylococcus species DETECTED (A) NOT DETECTED Final    Comment: CRITICAL RESULT CALLED TO, READ BACK BY AND VERIFIED WITH: Lanae Boast PHARMD 1612 02/05/21 A BROWNING    Staphylococcus aureus (BCID) NOT DETECTED NOT DETECTED Final   Staphylococcus epidermidis NOT DETECTED NOT DETECTED Final   Staphylococcus lugdunensis NOT DETECTED NOT DETECTED Final   Streptococcus species NOT DETECTED NOT DETECTED Final   Streptococcus agalactiae NOT DETECTED NOT DETECTED Final   Streptococcus pneumoniae NOT DETECTED NOT DETECTED Final   Streptococcus pyogenes NOT DETECTED NOT DETECTED Final   A.calcoaceticus-baumannii NOT DETECTED NOT DETECTED Final   Bacteroides fragilis NOT DETECTED NOT DETECTED Final   Enterobacterales NOT DETECTED NOT DETECTED Final   Enterobacter cloacae complex NOT DETECTED NOT DETECTED Final   Escherichia coli NOT DETECTED NOT DETECTED Final   Klebsiella aerogenes NOT DETECTED NOT DETECTED Final   Klebsiella oxytoca NOT DETECTED NOT DETECTED Final   Klebsiella pneumoniae NOT DETECTED NOT DETECTED Final   Proteus species NOT DETECTED NOT DETECTED Final   Salmonella species NOT DETECTED NOT DETECTED Final   Serratia marcescens NOT DETECTED NOT DETECTED Final   Haemophilus influenzae NOT DETECTED NOT DETECTED Final   Neisseria meningitidis NOT DETECTED NOT  DETECTED Final   Pseudomonas aeruginosa NOT DETECTED  NOT DETECTED Final   Stenotrophomonas maltophilia NOT DETECTED NOT DETECTED Final   Candida albicans NOT DETECTED NOT DETECTED Final   Candida auris NOT DETECTED NOT DETECTED Final   Candida glabrata NOT DETECTED NOT DETECTED Final   Candida krusei NOT DETECTED NOT DETECTED Final   Candida parapsilosis NOT DETECTED NOT DETECTED Final   Candida tropicalis NOT DETECTED NOT DETECTED Final   Cryptococcus neoformans/gattii NOT DETECTED NOT DETECTED Final    Comment: Performed at [facility], [place] [address] [place], [place] [address]         Radiology Studies: DG Foot Complete Left  Result Date: 02/10/2021 CLINICAL DATA:  Lateral left foot pain after fall 1 week ago EXAM: LEFT FOOT - COMPLETE 3+ VIEW COMPARISON:  None. FINDINGS: Frontal, oblique, and lateral views of the left foot are obtained. On the lateral view, there is a subtle cortical discontinuity at the dorsal proximal margin of the tarsal navicular, which could reflect an avulsion fracture. Please correlate with site of patient's pain. No other signs of acute fracture, subluxation, or dislocation. 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 Marginal erosion or chronic posttraumatic change at the distal medial margin of the first proximal phalanx also noted. The soft tissues are unremarkable. IMPRESSION: 1. Possible small avulsion fracture at the dorsal proximal margin of the tarsal navicular, only seen on lateral view. Please correlate with physical exam findings. 2. 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 3. Chronic posttraumatic change versus marginal erosion medial aspect head of the first proximal phalanx. Electronically Signed   By: Randa Ngo M.D.   On: 02/10/2021 15:57        Scheduled Meds: . pantoprazole  40 mg Oral BID AC   Continuous Infusions: . sodium chloride 250 mL (02/06/21 2231)  . vancomycin 1,250 mg (02/10/21 0857)     LOS: 11 days       Hosie Poisson, MD [facility]   To contact the attending provider between 7A-7P or the covering provider during after hours 7P-7A, please log into the web site [email or web address] and access using universal [place] password for that web site. If you do not have the password, please call the hospital operator.  02/10/2021, 5:36 PM

## 2021-02-10 NOTE — Progress Notes (Signed)
7 Days Post-Op  Subjective: No BM in 2 days.  hgb stable.  No abdominal pain.  No further blood per rectum since last week.  ROS: See above, otherwise other systems negative  Objective: Vital signs in last 24 hours: Temp:  [97.9 F (36.6 C)-98.8 F (37.1 C)] 98.8 F (37.1 C) (02/14 0722) Pulse Rate:  [66-80] 75 (02/14 0722) Resp:  [14-18] 17 (02/14 0722) BP: (108-127)/(72-80) 108/80 (02/14 0722) SpO2:  [97 %-100 %] 99 % (02/14 0722) Weight:  [75.2 kg] 75.2 kg (02/13 2354) Last BM Date: 02/08/21  Intake/Output from previous day: 02/13 0701 - 02/14 0700 In: 240 [P.O.:240] Out: 1200 [Urine:1200] Intake/Output this shift: No intake/output data recorded.  PE: Abd: soft, NT, ND, +BS  Lab Results:  Recent Labs    02/08/21 0708 02/08/21 1744 02/09/21 0619 02/09/21 1934  WBC 5.9  --  5.3  --   HGB 8.4*   < > 7.9* 7.9*  HCT 24.3*   < > 22.6* 24.2*  PLT 247  --  284  --    < > = values in this interval not displayed.   BMET Recent Labs    02/09/21 0619  NA 138  K 3.8  CL 106  CO2 24  GLUCOSE 100*  BUN 7*  CREATININE 0.83  CALCIUM 8.3*   PT/INR No results for input(s): LABPROT, INR in the last 72 hours. CMP     Component Value Date/Time   NA 138 02/09/2021 0619   K 3.8 02/09/2021 0619   CL 106 02/09/2021 0619   CO2 24 02/09/2021 0619   GLUCOSE 100 (H) 02/09/2021 0619   BUN 7 (L) 02/09/2021 0619   CREATININE 0.83 02/09/2021 0619   CALCIUM 8.3 (L) 02/09/2021 0619   PROT 6.3 (L) 01/29/2021 1239   ALBUMIN 3.5 01/29/2021 1239   AST 22 01/29/2021 1239   ALT 28 01/29/2021 1239   ALKPHOS 42 01/29/2021 1239   BILITOT 0.8 01/29/2021 1239   GFRNONAA >60 02/09/2021 0619   GFRAA >60 02/12/2019 1613   Lipase  No results found for: LIPASE     Studies/Results: No results found.  Anti-infectives: Anti-infectives (From admission, onward)   Start     Dose/Rate Route Frequency Ordered Stop   02/07/21 0000  linezolid (ZYVOX) 600 MG tablet        600 mg  Oral 2 times daily 02/07/21 1319 02/21/21 2359   02/05/21 0800  vancomycin (VANCOREADY) IVPB 1250 mg/250 mL        1,250 mg 166.7 mL/hr over 90 Minutes Intravenous Every 12 hours 02/04/21 1650     02/04/21 1745  vancomycin (VANCOREADY) IVPB 1500 mg/300 mL        1,500 mg 150 mL/hr over 120 Minutes Intravenous  Once 02/04/21 1650 02/04/21 1944   02/03/21 2200  cephALEXin (KEFLEX) capsule 500 mg  Status:  Discontinued        500 mg Oral Every 8 hours 02/03/21 1846 02/04/21 1559       Assessment/Plan HTN HLD Mild OSA GERD Erectile dysfunction  Thrombophlebitis - vanc per primary team   GI Bleeding  - painless hematochezia requiring multiple transfusions - EGD 2/5 with some non-bleeding gastric ulcers - colonoscopy 2/5 with scattered diverticula throughout, polyp at the hepatic flexure, internal and external hemorrhoids, no active source of bleeding  - CTA and tagged RBC scan have not localized bleeding -no further bloody BMs at this point and hgb stable -can likely stop frequent H/Hs checks -may have a  diet from our standpoint -no surgical plans  -if rebleeds, restart algorithm of bleeding scans, and IR as able -we will sign off.  Please call back as needed.  FEN:may have diet from our standpoint VTE: no chemical prophylaxis in setting of GIB ID: Vanc 2/8>> From surgical standpoint, no need for antibiotics, defer to primary team regarding management for other indications.    LOS: 11 days    Jay Robertson , [facility] 02/10/2021, 8:40 AM Please see Amion for pager number during day hours 7:00am-4:30pm or 7:00am -11:30am on weekends

## 2021-02-10 NOTE — Progress Notes (Signed)
   02/10/21 1300  Clinical Encounter Type  Visited With Patient and family together  Visit Type Initial  Referral From Nurse  Consult/Referral To [facility] ([facility])  Chaplain responded to the page for Mr. Puthoff. Paperwork for [facility] was ready for notary, informed Mr. Morain and Mrs. Chavarin the notary will not be in until Friday, but if they are able to have the paperwork outside of the hospital and bring the paperwork back to [facility], we can make sure it is uploaded into his chart and placed in his file.  Mr. And Mrs. Kaps was grateful for the help.  Chaplain Aniyia Rane Morgan-Simpson [phone]

## 2021-02-10 NOTE — Care Management Important Message (Signed)
Important Message  Patient Details  Name: Jay Robertson MRN: [id number] Date of Birth: [birth year]   Medicare Important Message Given:  Yes     Shelda Altes 02/10/2021, 9:49 AM

## 2021-02-11 LAB — BASIC METABOLIC PANEL
Anion gap: 6 (ref 5–15)
BUN: 9 mg/dL (ref 8–23)
CO2: 25 mmol/L (ref 22–32)
Calcium: 8.6 mg/dL — ABNORMAL LOW (ref 8.9–10.3)
Chloride: 106 mmol/L (ref 98–111)
Creatinine, Ser: 0.91 mg/dL (ref 0.61–1.24)
GFR, Estimated: >60 mL/min (ref >60)
Glucose, Bld: 92 mg/dL (ref 70–99)
Potassium: 4 mmol/L (ref 3.5–5.1)
Sodium: 137 mmol/L (ref 135–145)

## 2021-02-11 NOTE — Discharge Summary (Signed)
Physician Discharge Summary  Jay Robertson:[id number] DOB: 05/28/1956 DOA: 01/29/2021  PCP: Billie Ruddy, MD  Admit date: 01/29/2021 Discharge date: 02/11/2021  Admitted From: Home.  Disposition:  Home.   Recommendations for Outpatient Follow-up:  1. Follow up with PCP in 1-2 weeks 2. Please obtain BMP/CBC in one week Please follow up with orthopedics Dr Lucia Gaskins in one week.  Please follow up with gastroenterology in one week.   Discharge Condition:Guarded.  CODE STATUS: Full code.  Diet recommendation: Heart Healthy   Brief/Interim Summary: 65 year old with prior history of diverticulosis, essential hypertension admitted with bright red blood per rectum with intermittent hematochezia whom we are consulted for ongoing large-volume hematochezia.  He underwent multiple CT angiograms that were negative.  Colonoscopy and upper endoscopy completed on 2 5/22 showed no evidence of bleeding in the upper GI, no evidence of bleed on colonoscopy.  But there was blood throughout the large intestine with diverticulosis. Tagged RBC scan on 02/02/2021 was unrevealing.  Patient underwent a total of 10  units of PRBC transfusions so far. IR consulted for embolization, recommended Meckel's scan this am and if negative will need follow up with mesenteric angiogram and possible heparin/TPA challenge. Surgery consulted just in case bowel resection is required. But patient initially refused any further work up, wanted to wait to see if his bleeding will stop.  He denies any bloody bowel bowel movements overnight and his hemoglobin improved to 8.6. General surgery recommended no further work up.  IR and gen surgery signed off.    Discharge Diagnoses:  Principal Problem:   Acute lower GI bleeding Active Problems:   Essential hypertension   Syncope   GI bleed   Diverticulosis of colon with hemorrhage   GI bleed/diverticular bleed: He underwent multiple CT angiogram of the abdomen that were negative.   Colonoscopy and upper endoscopy completed on 2/ 5/22 showed no evidence of bleeding in the upper GI, no evidence of bleed on colonoscopy.  But there was blood throughout the large intestine with diverticulosis. Tagged RBC scan on 02/02/2021 was unrevealing.  Patient underwent a total of 10  units of PRBC transfusions Clear liquid diet and advance as tolerated. He is currently on regular diet.  IR consulted  recommended Meckel's scan this am and if negative will need follow up with mesenteric angiogram and possible heparin/TPA challenge. Surgery consulted just in case bowel resection is required.   Initially patient refused Meckel's scan and intervention and wants to wait and see if his bleeding stops. IR, GI aware of the plan.    No bloody bowel movements in  the last 24 hours and hemoglobin stable around 8.    Gastric biopsies were positive for H pylori. on discharge and after bleeding stops he will need prescriptions  for 14 days: protonix 40mg  BID Amoxicillin 1gm BID Clarithromycin 500mg  BID Flagyl 500mg  BID    Essential Hypertension;  Blood pressure parameters are optimal.    Superficial Thrombophlebitis: on IV vancomycin .  Blood cultures ordered, one bottle out of 4 , positive for staph Hominis ID on board and recommended stay on vancomycin while inpatient and will continue with linezolid at discharge for 2 weeks from now. If he remains inpatient for further interventions, he can stay on vancomycin and take linezolid through 2/24.   Echocardiogram ordered. It shows Left ventricular ejection fraction, by estimation, is 60  to 65%. The left ventricle has normal function. The left ventricle has no  regional wall motion abnormalities. The left ventricular  internal cavity size was normal in size.    Left ankle swelling, reports it started when he fell.  X rays show Possible small avulsion fracture at the dorsal proximal margin of the tarsal navicular, only seen on lateral  view.  Discussed with Dr Lucia Gaskins, recommended boot if painful and if able to walk and tolerate, recommend outpatient follow up in the office.      Discharge Instructions  Discharge Instructions    Diet - low sodium heart healthy   Complete by: As directed    Diet - low sodium heart healthy   Complete by: As directed    Discharge instructions   Complete by: As directed    Please follow up with GI, or PCP in less than a week to check hemoglobin.   Discharge instructions   Complete by: As directed    Please follow up with orthopedics as recommended in 1 week for the ankle.   Increase activity slowly   Complete by: As directed      Allergies as of 02/11/2021      Reactions   Doxycycline Other (See Comments)   Headaches      Medication List    STOP taking these medications   ARTIFICIAL TEARS PF OP   aspirin EC 325 MG tablet   benazepril 20 MG tablet Commonly known as: LOTENSIN   Brimonidine Tartrate 0.025 % Soln   fexofenadine 180 MG tablet Commonly known as: ALLEGRA   spironolactone 25 MG tablet Commonly known as: ALDACTONE   Viagra 50 MG tablet Generic drug: sildenafil     TAKE these medications   amoxicillin 500 MG tablet Commonly known as: AMOXIL Take 2 tablets (1,000 mg total) by mouth 2 (two) times daily for 14 days.   clarithromycin 500 MG tablet Commonly known as: BIAXIN Take 1 tablet (500 mg total) by mouth 2 (two) times daily for 14 days.   linezolid 600 MG tablet Commonly known as: ZYVOX Take 1 tablet (600 mg total) by mouth 2 (two) times daily for 14 days.   metroNIDAZOLE 500 MG tablet Commonly known as: Flagyl Take 1 tablet (500 mg total) by mouth 2 (two) times daily for 14 days.   pantoprazole 40 MG tablet Commonly known as: PROTONIX Take 1 tablet (40 mg total) by mouth 2 (two) times daily before a meal for 14 days.       Follow-up Information    Billie Ruddy, MD. Schedule an appointment as soon as possible for a visit on  02/19/2021.   Specialty: Family Medicine Why: please check CBC and bmp @9 :00am Contact information: [place] [address] [phone]        Yetta Flock, MD. Schedule an appointment as soon as possible for a visit in 1 week(s).   Specialty: Gastroenterology Contact information: [place] [address] [phone]        Erle Crocker, MD. Schedule an appointment as soon as possible for a visit in 1 week(s).   Specialty: Orthopedic Surgery Contact information: [place] [address] [phone]              Allergies  Allergen Reactions  . Doxycycline Other (See Comments)    Headaches    Consultations:  Gastroenterology  Surgery  IR    Procedures/Studies: DG Chest 1 View  Result Date: 02/01/2021 CLINICAL DATA:  Central line placement. EXAM: CHEST  1 VIEW COMPARISON:  February 12, 2019. FINDINGS: The heart size  and mediastinal contours are within normal limits. Both lungs are clear. No pneumothorax or pleural effusion is noted. 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 The visualized skeletal structures are unremarkable. IMPRESSION: Venous sheath is seen in right internal jugular vein with distal tip in expected position of the SVC. Electronically Signed   By: Marijo Conception M.D.   On: 02/01/2021 15:28   NM GI Blood Loss  Result Date: 02/01/2021 CLINICAL DATA:  Episode of syncope following bloody bowel movement. EXAM: NUCLEAR MEDICINE GASTROINTESTINAL BLEEDING SCAN TECHNIQUE: Sequential abdominal images were obtained following intravenous administration of Tc-20m labeled red blood cells. RADIOPHARMACEUTICALS:  22 mCi Tc-67m pertechnetate in-vitro labeled red cells. COMPARISON:  CT abdomen pelvis January 31, 2021 and January 29, 2021. FINDINGS: Normal distribution radiotracer. No extraluminal activity to suggest the presence of  active gastrointestinal bleed. IMPRESSION: No extraluminal radiotracer activity to suggest the presence of active gastrointestinal bleed. Electronically Signed   By: Dahlia Bailiff MD   On: 02/01/2021 20:44   DG Foot Complete Left  Result Date: 02/10/2021 CLINICAL DATA:  Lateral left foot pain after fall 1 week ago EXAM: LEFT FOOT - COMPLETE 3+ VIEW COMPARISON:  None. FINDINGS: Frontal, oblique, and lateral views of the left foot are obtained. On the lateral view, there is a subtle cortical discontinuity at the dorsal proximal margin of the tarsal navicular, which could reflect an avulsion fracture. Please correlate with site of patient's pain. No other signs of acute fracture, subluxation, or dislocation. 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 Marginal erosion or chronic posttraumatic change at the distal medial margin of the first proximal phalanx also noted. The soft tissues are unremarkable. IMPRESSION: 1. Possible small avulsion fracture at the dorsal proximal margin of the tarsal navicular, only seen on lateral view. Please correlate with physical exam findings. 2. 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 3. Chronic posttraumatic change versus marginal erosion medial aspect head of the first proximal phalanx. Electronically Signed   By: Randa Ngo M.D.   On: 02/10/2021 15:57   ECHOCARDIOGRAM COMPLETE  Result Date: 02/04/2021    ECHOCARDIOGRAM REPORT   Patient Name:   ZARIUS FURR Date of Exam: 02/04/2021 Medical Rec #:  [id number]   Height:       71.0 in Accession #:    [id number]  Weight:       175.0 lb Date of Birth:  May 08, 1956    BSA:          1.992 m Patient Age:    65 years    BP:           134/81 mmHg Patient Gender: M           HR:           80 bpm. Exam Location:  Inpatient Procedure: 2D Echo Indications:    murmur  History:        Patient has no prior history of Echocardiogram examinations.                 Signs/Symptoms:Syncope; Risk  Factors:Hypertension.  Sonographer:    Johny Chess Referring Phys: [id number] LAURA P CLARK  Sonographer Comments: Image acquisition challenging due to respiratory motion. IMPRESSIONS  1. Left ventricular ejection fraction, by estimation, is 60 to 65%. The left ventricle has normal function. The left ventricle has no regional wall motion abnormalities. There is mild left ventricular hypertrophy. Left ventricular diastolic parameters are  consistent with Grade I diastolic dysfunction (impaired relaxation).  2. Right ventricular systolic function is normal. The right ventricular size is normal.  3. The mitral valve is normal in structure. No evidence of mitral valve regurgitation. No evidence of mitral stenosis.  4. The aortic valve has an indeterminant number of cusps. Aortic valve regurgitation is not visualized. No aortic stenosis is present.  5. The inferior vena cava is normal in size with greater than 50% respiratory variability, suggesting right atrial pressure of 3 mmHg. FINDINGS  Left Ventricle: Left ventricular ejection fraction, by estimation, is 60 to 65%. The left ventricle has normal function. The left ventricle has no regional wall motion abnormalities. The left ventricular internal cavity size was normal in size. There is  mild left ventricular hypertrophy. Left ventricular diastolic parameters are consistent with Grade I diastolic dysfunction (impaired relaxation). Right Ventricle: The right ventricular size is normal. Right ventricular systolic function is normal. Left Atrium: Left atrial size was normal in size. Right Atrium: Right atrial size was normal in size. Pericardium: Trivial pericardial effusion is present. Mitral Valve: The mitral valve is normal in structure. No evidence of mitral valve regurgitation. No evidence of mitral valve stenosis. Tricuspid Valve: The tricuspid valve is normal in structure. Tricuspid valve regurgitation is trivial. No evidence of tricuspid stenosis. Aortic  Valve: The aortic valve has an indeterminant number of cusps. Aortic valve regurgitation is not visualized. No aortic stenosis is present. Pulmonic Valve: The pulmonic valve was normal in structure. Pulmonic valve regurgitation is not visualized. No evidence of pulmonic stenosis. Aorta: The aortic root is normal in size and structure. Venous: The inferior vena cava is normal in size with greater than 50% respiratory variability, suggesting right atrial pressure of 3 mmHg. IAS/Shunts: No atrial level shunt detected by color flow Doppler.  LEFT VENTRICLE PLAX 2D LVIDd:         3.90 cm  Diastology LVIDs:         2.40 cm  LV e' medial:    8.16 cm/s LV PW:         1.10 cm  LV E/e' medial:  10.6 LV IVS:        1.40 cm  LV e' lateral:   11.00 cm/s LVOT diam:     2.10 cm  LV E/e' lateral: 7.8 LV SV:         90 LV SV Index:   45 LVOT Area:     3.46 cm  RIGHT VENTRICLE             IVC RV S prime:     15.90 cm/s  IVC diam: 1.20 cm TAPSE (M-mode): 2.1 cm LEFT ATRIUM             Index       RIGHT ATRIUM           Index LA diam:        3.20 cm 1.61 cm/m  RA Area:     12.20 cm LA Vol (A2C):   35.9 ml 18.02 ml/m RA Volume:   28.90 ml  14.50 ml/m LA Vol (A4C):   38.8 ml 19.47 ml/m LA Biplane Vol: 38.4 ml 19.27 ml/m  AORTIC VALVE LVOT Vmax:   144.00 cm/s LVOT Vmean:  94.700 cm/s LVOT VTI:    0.259 m  AORTA Ao Root diam: 3.10 cm Ao Asc diam:  3.20 cm MITRAL VALVE MV Area (PHT): 3.99 cm    SHUNTS MV Decel Time: 190 msec    Systemic  VTI:  0.26 m MV E velocity: 86.30 cm/s  Systemic Diam: 2.10 cm MV A velocity: 83.40 cm/s MV E/A ratio:  1.03 Kirk Ruths MD Electronically signed by Kirk Ruths MD Signature Date/Time: 02/04/2021/4:13:26 PM    Final    [place] LT UPPER EXTREM LTD SOFT TISSUE NON VASCULAR  Result Date: 02/03/2021 CLINICAL DATA:  Erythema edema in the left AC EXAM: ULTRASOUND left UPPER EXTREMITY LIMITED TECHNIQUE: Ultrasound examination of the upper extremity soft tissues was performed in the area of clinical  concern. COMPARISON:  None. FINDINGS: Within the area of interest within the left antecubital fossa there is a superficial cephalic venous thrombus which is partially occlusive extending from the antecubital fossa to the mid upper arm. No loculated fluid collections or soft tissue mass is seen. IMPRESSION: Superficial thrombophlebitis within the cephalic vein within the area of interest. No loculated fluid collections. Electronically Signed   By: Prudencio Pair M.D.   On: 02/03/2021 22:58   CT Angio Abd/Pel w/ and/or w/o  Result Date: 02/05/2021 CLINICAL DATA:  65 year old with GI bleeding. Evaluate for bleeding source. EXAM: CTA ABDOMEN AND PELVIS WITHOUT AND WITH CONTRAST TECHNIQUE: Multidetector CT imaging of the abdomen and pelvis was performed using the standard protocol during bolus administration of intravenous contrast. Multiplanar reconstructed images and MIPs were obtained and reviewed to evaluate the vascular anatomy. CONTRAST:  139mL OMNIPAQUE IOHEXOL 350 MG/ML SOLN COMPARISON:  CT 08/20/2021 FINDINGS: VASCULAR Aorta: Mild atherosclerotic disease without stenosis, aneurysm or dissection. 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 Main branch vessels are patent. Variant anatomy with the right hepatic artery originating near the origin of the common hepatic artery. There is also a replaced left hepatic artery. SMA: Patent without evidence of aneurysm, dissection, vasculitis or significant stenosis. Prominent pancreatic-duodenal branches but no evidence for active bleeding or pseudoaneurysm formation. Renals: Single right renal artery is widely patent without aneurysm, dissection or FMD. Main left renal artery is patent without aneurysm, dissection or FMD. There is a small accessory left renal artery. IMA: Patent Inflow: Patent without evidence of aneurysm, dissection, vasculitis or significant stenosis. Mild atherosclerotic disease in the  right iliac arteries. Proximal Outflow: Proximal femoral arteries are patent bilaterally. Veins: Portal venous system is patent. Hepatic veins are patent. IVC and renal veins are patent. Bilateral iliac veins are patent. Visualized proximal femoral veins are patent. Review of the MIP images confirms the above findings. NON-VASCULAR Lower chest: Few patchy densities in the left lower lobe are unchanged and probably represent postinflammatory changes. Otherwise, lung bases are clear. No pleural effusions. Hepatobiliary: Normal appearance of the liver and gallbladder. No biliary dilatation. No evidence for a hepatic lesion. Pancreas: Unremarkable. No pancreatic ductal dilatation or surrounding inflammatory changes. Spleen: Normal in size without focal abnormality. Adrenals/Urinary Tract: Normal appearance of the adrenal glands. Evidence for extrarenal pelvis bilaterally. Small exophytic low-density along the posterior left kidney is suggestive for a cyst based on the Hounsfield units. No suspicious renal lesion. Normal appearance of the urinary bladder. Stomach/Bowel: The endoscopy capsule is located in the cecum. No evidence for active GI bleeding. No acute bowel inflammation. Mild distention of the distal stomach and proximal duodenal region. Small diverticula involving the right colon. Small diverticula in the proximal sigmoid colon region Lymphatic: No lymph node enlargement in the abdomen or pelvis. Reproductive: Prostate is mildly prominent measuring 4.6 cm in the transverse dimension. Other: Negative for ascites. Negative for free air. Tiny ventral hernia containing fat. Musculoskeletal:  No acute bone abnormality. IMPRESSION: VASCULAR 1. No evidence for active GI bleeding. 2. Mild atherosclerotic disease. NON-VASCULAR 1. No acute abnormality in the abdomen or pelvis. 2. Endoscopy capsule is located in the cecum. 3. Persistent small densities in left lung lower lobe that could represent postinflammatory changes.  Electronically Signed   By: Markus Daft M.D.   On: 02/05/2021 14:12   CT Angio Abd/Pel w/ and/or w/o  Result Date: 01/31/2021 CLINICAL DATA:  Bright red GI bleeding EXAM: CTA ABDOMEN AND PELVIS WITHOUT AND WITH CONTRAST TECHNIQUE: Multidetector CT imaging of the abdomen and pelvis was performed using the standard protocol during bolus administration of intravenous contrast. Multiplanar reconstructed images and MIPs were obtained and reviewed to evaluate the vascular anatomy. CONTRAST:  156mL OMNIPAQUE IOHEXOL 350 MG/ML SOLN COMPARISON:  CTA abdomen and pelvis dated 01/29/2021 FINDINGS: VASCULAR Aorta: Normal caliber aorta without aneurysm, dissection, vasculitis or significant stenosis. Trace atherosclerotic calcifications along the abdominal aorta. Celiac: Patent without evidence of aneurysm, dissection, vasculitis or significant stenosis. The left hepatic artery is replaced to the left gastric artery. SMA: Patent without evidence of aneurysm, dissection, vasculitis or significant stenosis. Renals: Both renal arteries are patent without evidence of aneurysm, dissection, vasculitis, fibromuscular dysplasia or significant stenosis. IMA: Patent without evidence of aneurysm, dissection, vasculitis or significant stenosis. Inflow: Patent without evidence of aneurysm, dissection, vasculitis or significant stenosis. Proximal Outflow: Bilateral common femoral and visualized portions of the superficial and profunda femoral arteries are patent without evidence of aneurysm, dissection, vasculitis or significant stenosis. Veins: No focal venous abnormality. Review of the MIP images confirms the above findings. NON-VASCULAR Lower chest: No acute abnormality. Hepatobiliary: No focal liver abnormality is seen. No gallstones, gallbladder wall thickening, or biliary dilatation. High attenuation material within the gallbladder lumen favored to reflect vicarious excretion of contrast material. Pancreas: Unremarkable. No pancreatic  ductal dilatation or surrounding inflammatory changes. Spleen: Normal in size without focal abnormality. Adrenals/Urinary Tract: Adrenal glands are unremarkable. Kidneys are normal, without renal calculi, focal l solid lesion, or hydronephrosis. Bladder is unremarkable. Small simple cyst exophytic from the lower pole of the left kidney. Stomach/Bowel: Stomach is within normal limits. Appendix appears normal. No evidence of bowel wall thickening, distention, or inflammatory changes. There are a few scattered colonic diverticula but no evidence of active diverticulitis. Lymphatic: No suspicious lymphadenopathy. Reproductive: Prostate is unremarkable. Other: No abdominal wall hernia or abnormality. No abdominopelvic ascites. Musculoskeletal: No acute or significant osseous findings. Focal L5-S1 degenerative disc disease. IMPRESSION: 1. No evidence of active GI bleed at this time. 2. Mild colonic diverticulosis without evidence of diverticulitis. Electronically Signed   By: Jacqulynn Cadet M.D.   On: 01/31/2021 15:09   CT Angio Abd/Pel W and/or Wo Contrast  Result Date: 01/29/2021 CLINICAL DATA:  65 year old male with GI bleed. EXAM: CTA ABDOMEN AND PELVIS WITHOUT AND WITH CONTRAST TECHNIQUE: Multidetector CT imaging of the abdomen and pelvis was performed using the standard protocol during bolus administration of intravenous contrast. Multiplanar reconstructed images and MIPs were obtained and reviewed to evaluate the vascular anatomy. CONTRAST:  18mL OMNIPAQUE IOHEXOL 350 MG/ML SOLN COMPARISON:  None. FINDINGS: VASCULAR Aorta: Mild atherosclerotic calcification. No aneurysmal dilatation or dissection. No periaortic fluid collection. Celiac: The celiac axis and its major branches are patent. There is a replaced left hepatic artery from left gastric artery. SMA: Patent without evidence of aneurysm, dissection, vasculitis or significant stenosis. Renals: Both renal arteries are patent without evidence of aneurysm,  dissection, vasculitis, fibromuscular dysplasia or significant stenosis. IMA: Patent  without evidence of aneurysm, dissection, vasculitis or significant stenosis. Inflow: Mild atherosclerotic calcification. No aneurysmal dilatation or dissection. The iliac arteries are patent. Proximal Outflow: Bilateral common femoral and visualized portions of the superficial and profunda femoral arteries are patent without evidence of aneurysm, dissection, vasculitis or significant stenosis. Veins: The IVC is unremarkable. The SMV, splenic vein, and main portal vein are patent. No portal venous gas. Review of the MIP images confirms the above findings. NON-VASCULAR Lower chest: The visualized lung bases are clear. No intra-abdominal free air or free fluid. Hepatobiliary: No focal liver abnormality is seen. No gallstones, gallbladder wall thickening, or biliary dilatation. Pancreas: Unremarkable. No pancreatic ductal dilatation or surrounding inflammatory changes. Spleen: Normal in size without focal abnormality. Adrenals/Urinary Tract: The adrenal glands unremarkable. High density content within the renal collecting system on precontrast study may represent residual contrast from prior administration. However, a 3 mm nonobstructing left renal inferior pole calculus may be present. There is no hydronephrosis on either side. There is symmetric enhancement of the kidneys. A 1 cm left renal interpolar cyst. The visualized ureters appear unremarkable. The urinary bladder is collapsed. Stomach/Bowel: There is loose stool throughout the colon compatible with diarrheal state. There is sigmoid diverticulosis and scattered colonic diverticula without active inflammatory changes. There is segmental thickening of the proximal transverse colon which may be related to underdistention. There is however apparent soft tissue nodularity at the distal portion of the thickened segment (coronal 38/19) and therefore a mass is not excluded. Further  evaluation with colonoscopy after resolution of acute symptoms recommended. There is overall diffuse increase in the attenuation of the colonic content post contrast which may be related to hyperemia suggestive of possible mild colitis. No obvious focal contrast extravasation identified. There is no bowel obstruction. Appendectomy. Lymphatic: No adenopathy. Reproductive: The prostate and seminal vesicles are grossly unremarkable. Other: None Musculoskeletal: Degenerative changes with disc desiccation at L5-S1. No acute osseous pathology. IMPRESSION: 1. No definite CT evidence of active GI bleed. 2. Diarrheal state with possible mild colitis. No bowel obstruction. 3. Colonic diverticulosis. 4. Segmental thickening of the proximal transverse colon may be related to underdistention. A colonic mass is not excluded. Further evaluation with colonoscopy after resolution of acute symptoms recommended. Electronically Signed   By: Anner Crete M.D.   On: 01/29/2021 18:22       Subjective: NO bleeding, no nausea, vomiting, abd pain, no chest pain or sob.  Discharge Exam: Vitals:   02/11/21 0412 02/11/21 0746  BP: 112/74 (!) 142/80  Pulse: 62 74  Resp: 16 20  Temp: 98.1 F (36.7 C) 98.1 F (36.7 C)  SpO2: 98% 100%   Vitals:   02/10/21 2233 02/11/21 0412 02/11/21 0513 02/11/21 0746  BP: 119/76 112/74  (!) 142/80  Pulse: 62 62  74  Resp: 14 16  20   Temp: 98 F (36.7 C) 98.1 F (36.7 C)  98.1 F (36.7 C)  TempSrc: Oral Oral  Oral  SpO2: 100% 98%  100%  Weight:   74.3 kg   Height:        General: Pt is alert, awake, not in acute distress Cardiovascular: RRR, S1/S2 +, no rubs, no gallops Respiratory: CTA bilaterally, no wheezing, no rhonchi Abdominal: Soft, NT, ND, bowel sounds + Extremities: no edema, no cyanosis    The results of significant diagnostics from this hospitalization (including imaging, microbiology, ancillary and laboratory) are listed below for reference.      Microbiology: Recent Results (from the past 240 hour(s))  Culture,  blood (routine x 2)     Status: None   Collection Time: 02/04/21  3:08 PM   Specimen: BLOOD  Result Value Ref Range Status   Specimen Description BLOOD RIGHT ANTECUBITAL  Final   Special Requests   Final    BOTTLES DRAWN AEROBIC AND ANAEROBIC Blood Culture adequate volume   Culture   Final    NO GROWTH 5 DAYS Performed at [facility], [address] [place], [place] [address]    Report Status 02/09/2021 FINAL  Final  Culture, blood (routine x 2)     Status: Abnormal   Collection Time: 02/04/21  3:09 PM   Specimen: BLOOD  Result Value Ref Range Status   Specimen Description BLOOD RIGHT ANTECUBITAL  Final   Special Requests   Final    BOTTLES DRAWN AEROBIC AND ANAEROBIC Blood Culture adequate volume   Culture  Setup Time   Final    GRAM POSITIVE COCCI IN CLUSTERS AEROBIC BOTTLE ONLY CRITICAL RESULT CALLED TO, READ BACK BY AND VERIFIED WITH: K HURTH PHARMD 1612 02/05/21 A BROWNING    Culture (A)  Final    STAPHYLOCOCCUS HOMINIS THE SIGNIFICANCE OF ISOLATING THIS ORGANISM FROM A SINGLE SET OF BLOOD CULTURES WHEN MULTIPLE SETS ARE DRAWN IS UNCERTAIN. PLEASE NOTIFY THE MICROBIOLOGY DEPARTMENT WITHIN ONE WEEK IF SPECIATION AND SENSITIVITIES ARE REQUIRED. Performed at [facility], [place] [address] [place], [place] [address]    Report Status 02/06/2021 FINAL  Final  Blood Culture ID Panel (Reflexed)     Status: Abnormal   Collection Time: 02/04/21  3:09 PM  Result Value Ref Range Status   Enterococcus faecalis NOT DETECTED NOT DETECTED Final   Enterococcus Faecium NOT DETECTED NOT DETECTED Final   Listeria monocytogenes NOT DETECTED NOT DETECTED Final   Staphylococcus species DETECTED (A) NOT DETECTED Final    Comment: CRITICAL RESULT CALLED TO, READ BACK BY AND VERIFIED WITH: Lanae Boast PHARMD 1612 02/05/21 A BROWNING    Staphylococcus aureus (BCID) NOT DETECTED NOT DETECTED Final   Staphylococcus  epidermidis NOT DETECTED NOT DETECTED Final   Staphylococcus lugdunensis NOT DETECTED NOT DETECTED Final   Streptococcus species NOT DETECTED NOT DETECTED Final   Streptococcus agalactiae NOT DETECTED NOT DETECTED Final   Streptococcus pneumoniae NOT DETECTED NOT DETECTED Final   Streptococcus pyogenes NOT DETECTED NOT DETECTED Final   A.calcoaceticus-baumannii NOT DETECTED NOT DETECTED Final   Bacteroides fragilis NOT DETECTED NOT DETECTED Final   Enterobacterales NOT DETECTED NOT DETECTED Final   Enterobacter cloacae complex NOT DETECTED NOT DETECTED Final   Escherichia coli NOT DETECTED NOT DETECTED Final   Klebsiella aerogenes NOT DETECTED NOT DETECTED Final   Klebsiella oxytoca NOT DETECTED NOT DETECTED Final   Klebsiella pneumoniae NOT DETECTED NOT DETECTED Final   Proteus species NOT DETECTED NOT DETECTED Final   Salmonella species NOT DETECTED NOT DETECTED Final   Serratia marcescens NOT DETECTED NOT DETECTED Final   Haemophilus influenzae NOT DETECTED NOT DETECTED Final   Neisseria meningitidis NOT DETECTED NOT DETECTED Final   Pseudomonas aeruginosa NOT DETECTED NOT DETECTED Final   Stenotrophomonas maltophilia NOT DETECTED NOT DETECTED Final   Candida albicans NOT DETECTED NOT DETECTED Final   Candida auris NOT DETECTED NOT DETECTED Final   Candida glabrata NOT DETECTED NOT DETECTED Final   Candida krusei NOT DETECTED NOT DETECTED Final   Candida parapsilosis NOT DETECTED NOT DETECTED Final   Candida tropicalis NOT DETECTED NOT DETECTED Final   Cryptococcus neoformans/gattii NOT DETECTED NOT DETECTED Final  Comment: Performed at [facility], [place] [address] [place], [place] [address]     Labs: BNP (last 3 results) No results for input(s): BNP in the last 8760 hours. Basic Metabolic Panel: Recent Labs  Lab 02/04/21 1912 02/06/21 0307 02/09/21 0619 02/11/21 0419  NA 135 136 138 137  K 3.6 3.9 3.8 4.0  CL 107 105 106 106  CO2 19* 22 24 25   GLUCOSE 115* 92  100* 92  BUN 12 10 7* 9  CREATININE 0.91 0.96 0.83 0.91  CALCIUM 7.7* 8.1* 8.3* 8.6*   Liver Function Tests: No results for input(s): AST, ALT, ALKPHOS, BILITOT, PROT, ALBUMIN in the last 168 hours. No results for input(s): LIPASE, AMYLASE in the last 168 hours. No results for input(s): AMMONIA in the last 168 hours. CBC: Recent Labs  Lab 02/05/21 0926 02/05/21 1901 02/06/21 0307 02/06/21 1757 02/07/21 0142 02/07/21 1733 02/08/21 0708 02/08/21 1744 02/09/21 0619 02/09/21 1934 02/10/21 0829  WBC 8.1  --  6.9  --  6.0  --  5.9  --  5.3  --   --   HGB 8.2*   < > 7.4*   < > 7.1*   < > 8.4* 8.6* 7.9* 7.9* 8.6*  HCT 25.0*   < > 21.4*   < > 20.2*   < > 24.3* 26.4* 22.6* 24.2* 26.0*  MCV 89.6  --  87.0  --  87.8  --  88.0  --  87.9  --   --   PLT 154  --  166  --  203  --  247  --  284  --   --    < > = values in this interval not displayed.   Cardiac Enzymes: No results for input(s): CKTOTAL, CKMB, CKMBINDEX, TROPONINI in the last 168 hours. BNP: Invalid input(s): POCBNP CBG: Recent Labs  Lab 02/04/21 1906  GLUCAP 96   D-Dimer No results for input(s): DDIMER in the last 72 hours. Hgb A1c No results for input(s): HGBA1C in the last 72 hours. Lipid Profile No results for input(s): CHOL, HDL, LDLCALC, TRIG, CHOLHDL, LDLDIRECT in the last 72 hours. Thyroid function studies No results for input(s): TSH, T4TOTAL, T3FREE, THYROIDAB in the last 72 hours.  Invalid input(s): FREET3 Anemia work up No results for input(s): VITAMINB12, FOLATE, FERRITIN, TIBC, IRON, RETICCTPCT in the last 72 hours. Urinalysis    Component Value Date/Time   COLORURINE YELLOW 02/12/2019 1642   APPEARANCEUR CLEAR 02/12/2019 1642   LABSPEC 1.020 02/12/2019 1642   PHURINE 7.0 02/12/2019 1642   GLUCOSEU NEGATIVE 02/12/2019 1642   HGBUR NEGATIVE 02/12/2019 1642   HGBUR negative 10/09/2010 1158   BILIRUBINUR NEGATIVE 02/12/2019 1642   BILIRUBINUR Neg 11/22/2012 1454   KETONESUR 5 (A) 02/12/2019  1642   PROTEINUR NEGATIVE 02/12/2019 1642   UROBILINOGEN 0.2 01/09/2015 2002   NITRITE NEGATIVE 02/12/2019 1642   LEUKOCYTESUR SMALL (A) 02/12/2019 1642   Sepsis Labs Invalid input(s): PROCALCITONIN,  WBC,  LACTICIDVEN Microbiology Recent Results (from the past 240 hour(s))  Culture, blood (routine x 2)     Status: None   Collection Time: 02/04/21  3:08 PM   Specimen: BLOOD  Result Value Ref Range Status   Specimen Description BLOOD RIGHT ANTECUBITAL  Final   Special Requests   Final    BOTTLES DRAWN AEROBIC AND ANAEROBIC Blood Culture adequate volume   Culture   Final    NO GROWTH 5 DAYS Performed at [facility], [place] [address],  [place], [place] [address]    Report Status 02/09/2021 FINAL  Final  Culture, blood (routine x 2)     Status: Abnormal   Collection Time: 02/04/21  3:09 PM   Specimen: BLOOD  Result Value Ref Range Status   Specimen Description BLOOD RIGHT ANTECUBITAL  Final   Special Requests   Final    BOTTLES DRAWN AEROBIC AND ANAEROBIC Blood Culture adequate volume   Culture  Setup Time   Final    GRAM POSITIVE COCCI IN CLUSTERS AEROBIC BOTTLE ONLY CRITICAL RESULT CALLED TO, READ BACK BY AND VERIFIED WITH: K HURTH PHARMD 1612 02/05/21 A BROWNING    Culture (A)  Final    STAPHYLOCOCCUS HOMINIS THE SIGNIFICANCE OF ISOLATING THIS ORGANISM FROM A SINGLE SET OF BLOOD CULTURES WHEN MULTIPLE SETS ARE DRAWN IS UNCERTAIN. PLEASE NOTIFY THE MICROBIOLOGY DEPARTMENT WITHIN ONE WEEK IF SPECIATION AND SENSITIVITIES ARE REQUIRED. Performed at [facility], [place] [address], [place], [place] [address]    Report Status 02/06/2021 FINAL  Final  Blood Culture ID Panel (Reflexed)     Status: Abnormal   Collection Time: 02/04/21  3:09 PM  Result Value Ref Range Status   Enterococcus faecalis NOT DETECTED NOT DETECTED Final   Enterococcus Faecium NOT DETECTED NOT DETECTED Final   Listeria monocytogenes NOT DETECTED NOT DETECTED Final   Staphylococcus species DETECTED (A)  NOT DETECTED Final    Comment: CRITICAL RESULT CALLED TO, READ BACK BY AND VERIFIED WITH: Lanae Boast PHARMD 1612 02/05/21 A BROWNING    Staphylococcus aureus (BCID) NOT DETECTED NOT DETECTED Final   Staphylococcus epidermidis NOT DETECTED NOT DETECTED Final   Staphylococcus lugdunensis NOT DETECTED NOT DETECTED Final   Streptococcus species NOT DETECTED NOT DETECTED Final   Streptococcus agalactiae NOT DETECTED NOT DETECTED Final   Streptococcus pneumoniae NOT DETECTED NOT DETECTED Final   Streptococcus pyogenes NOT DETECTED NOT DETECTED Final   A.calcoaceticus-baumannii NOT DETECTED NOT DETECTED Final   Bacteroides fragilis NOT DETECTED NOT DETECTED Final   Enterobacterales NOT DETECTED NOT DETECTED Final   Enterobacter cloacae complex NOT DETECTED NOT DETECTED Final   Escherichia coli NOT DETECTED NOT DETECTED Final   Klebsiella aerogenes NOT DETECTED NOT DETECTED Final   Klebsiella oxytoca NOT DETECTED NOT DETECTED Final   Klebsiella pneumoniae NOT DETECTED NOT DETECTED Final   Proteus species NOT DETECTED NOT DETECTED Final   Salmonella species NOT DETECTED NOT DETECTED Final   Serratia marcescens NOT DETECTED NOT DETECTED Final   Haemophilus influenzae NOT DETECTED NOT DETECTED Final   Neisseria meningitidis NOT DETECTED NOT DETECTED Final   Pseudomonas aeruginosa NOT DETECTED NOT DETECTED Final   Stenotrophomonas maltophilia NOT DETECTED NOT DETECTED Final   Candida albicans NOT DETECTED NOT DETECTED Final   Candida auris NOT DETECTED NOT DETECTED Final   Candida glabrata NOT DETECTED NOT DETECTED Final   Candida krusei NOT DETECTED NOT DETECTED Final   Candida parapsilosis NOT DETECTED NOT DETECTED Final   Candida tropicalis NOT DETECTED NOT DETECTED Final   Cryptococcus neoformans/gattii NOT DETECTED NOT DETECTED Final    Comment: Performed at [facility], [address], [place],  [address]     Time coordinating discharge: 36 min.  SIGNED:   Hosie Poisson,  MD  [facility] 02/11/2021, 9:52 AM

## 2021-02-11 NOTE — Progress Notes (Signed)
   02/11/21 0820  Clinical Encounter Type  Visited With Patient  Visit Type Follow-up   Pt called to follow-up about HCPOA. Chaplain informed Pt that he can be added to the list of Pts in need of notary services this Friday. An in-hospital notary isn't available until that time. Chaplain remains available.   This note was prepared by Chaplain Resident, Dante Gang, MDiv. Chaplain remains available as needed through the on-call pager: [phone].

## 2021-02-12 ENCOUNTER — Other Ambulatory Visit: Payer: Self-pay

## 2021-02-12 ENCOUNTER — Telehealth: Payer: Self-pay

## 2021-02-12 NOTE — Telephone Encounter (Signed)
The pt has been scheduled for 4/5 at 950 with Dr Rush Landmark

## 2021-02-12 NOTE — Telephone Encounter (Signed)
-----   Message from Timothy Lasso, RN sent at 02/07/2021 11:24 AM EST ----- Regarding: FW: outpatient follow up for GM  ----- Message ----- From: Yetta Flock, MD Sent: 02/07/2021  11:18 AM EST To: Timothy Lasso, RN Subject: outpatient follow up for Shon Hough this patient will need follow up with GM or APP in the next 4-6 weeks or so following his hospital discharge. Thanks

## 2021-02-13 ENCOUNTER — Ambulatory Visit: Payer: Federal, State, Local not specified - PPO | Admitting: Family Medicine

## 2021-02-14 ENCOUNTER — Encounter: Payer: Self-pay | Admitting: Family Medicine

## 2021-02-14 ENCOUNTER — Ambulatory Visit (INDEPENDENT_AMBULATORY_CARE_PROVIDER_SITE_OTHER): Payer: Medicare Other | Admitting: Family Medicine

## 2021-02-14 ENCOUNTER — Other Ambulatory Visit: Payer: Self-pay

## 2021-02-14 VITALS — BP 122/80 | HR 86 | Temp 97.9°F | Ht 71.0 in | Wt 171.4 lb

## 2021-02-14 DIAGNOSIS — D649 Anemia, unspecified: Secondary | ICD-10-CM | POA: Diagnosis not present

## 2021-02-14 DIAGNOSIS — Z09 Encounter for follow-up examination after completed treatment for conditions other than malignant neoplasm: Secondary | ICD-10-CM

## 2021-02-14 DIAGNOSIS — K5731 Diverticulosis of large intestine without perforation or abscess with bleeding: Secondary | ICD-10-CM | POA: Diagnosis not present

## 2021-02-14 DIAGNOSIS — K922 Gastrointestinal hemorrhage, unspecified: Secondary | ICD-10-CM

## 2021-02-14 DIAGNOSIS — I808 Phlebitis and thrombophlebitis of other sites: Secondary | ICD-10-CM

## 2021-02-14 DIAGNOSIS — A048 Other specified bacterial intestinal infections: Secondary | ICD-10-CM

## 2021-02-14 LAB — CBC WITH DIFFERENTIAL/PLATELET
Basophils Absolute: 0 10*3/uL (ref 0.0–0.1)
Basophils Relative: 0.4 % (ref 0.0–3.0)
Eosinophils Absolute: 0 10*3/uL (ref 0.0–0.7)
Eosinophils Relative: 0.5 % (ref 0.0–5.0)
HCT: 28.1 % — ABNORMAL LOW (ref 39.0–52.0)
Hemoglobin: 9.3 g/dL — ABNORMAL LOW (ref 13.0–17.0)
Lymphocytes Relative: 22.2 % (ref 12.0–46.0)
Lymphs Abs: 1 10*3/uL (ref 0.7–4.0)
MCHC: 33.1 g/dL (ref 30.0–36.0)
MCV: 91.5 fl (ref 78.0–100.0)
Monocytes Absolute: 0.4 10*3/uL (ref 0.1–1.0)
Monocytes Relative: 8.2 % (ref 3.0–12.0)
Neutro Abs: 3.2 10*3/uL (ref 1.4–7.7)
Neutrophils Relative %: 68.7 % (ref 43.0–77.0)
Platelets: 468 10*3/uL — ABNORMAL HIGH (ref 150.0–400.0)
RBC: 3.07 Mil/uL — ABNORMAL LOW (ref 4.22–5.81)
RDW: 18.5 % — ABNORMAL HIGH (ref 11.5–15.5)
WBC: 4.6 10*3/uL (ref 4.0–10.5)

## 2021-02-14 LAB — BASIC METABOLIC PANEL
BUN: 9 mg/dL (ref 6–23)
CO2: 26 mEq/L (ref 19–32)
Calcium: 8.8 mg/dL (ref 8.4–10.5)
Chloride: 106 mEq/L (ref 96–112)
Creatinine, Ser: 1.03 mg/dL (ref 0.40–1.50)
GFR: 76.44 mL/min (ref >60.00)
Glucose, Bld: 102 mg/dL — ABNORMAL HIGH (ref 70–99)
Potassium: 4.1 mEq/L (ref 3.5–5.1)
Sodium: 139 mEq/L (ref 135–145)

## 2021-02-14 NOTE — Progress Notes (Signed)
Subjective:    Patient ID: Jay Robertson, male    DOB: 01/18/1956, 65 y.o.   MRN: [id number]  Chief Complaint  Patient presents with  . Follow-up    Hospital follow up    HPI Pt is a 65 yo male with pmh sig for diverticulosis, HTN who was seen today for HFU, admitted 2/2-2/15/22 for GIB.  Pt had intermittent hematochezia and BRBPR with syncope the day of admission.  Multiple CT angiograms were negative.  Colonoscopy and EGD were negative on 02/01/21, however blood was noted throughout the large intestine with diverticulosis.  Pt also underwent capsule study. Tagged RBC scan on 02/02/2021 negative.  Pt s/p 10 units pRBCs.  Gastric biopsies positive for H. pylori.  Given Rx for Protonix 40 mg twice daily, amoxicillin 1 g twice daily, clarithromycin 500 mg twice daily, and Flagyl 500 mg twice daily.  IR consulted, Meckel's scan recommended and if negative mesenteric angio and possible heparin/TPA challenge, Surgery consulted in event bowel resection needed, pt declined.  Superficial thrombophlebitis noted, pt given IV Vanc, blood cultures with 1 of 4 bottles positive for staph Hominis.  ID consulted, IV Vanc continued during hospitalization and Zyvox recommended as outpatient until 02/20/2021.  ECHO with EF 60-65%, normal LV function.  Pt injured L ankle s/p syncopal episode in hospital.  X-ray for left ankle pain with possible small avulsion fracture only seen on lateral view.  Dr. Lucia Gaskins, Ortho recommended boot for continued pain/inability to walk with outpatient follow-up.  Since d/c, pt notes dizziness, pulsing sensation in ear.  Increased po intake of spinach and green veggies, but stopped 2/2 having dark appearing stools.  Had normal colored stools x 2 at time of d/c.  Was drinking half-and-half to increase dairy intake, but stopped 2/2 diarrhea. GI appt not until 04/01/21.   Past Medical History:  Diagnosis Date  . ALLERGIC RHINITIS 10/24/2007  . HYPERLIPIDEMIA 10/16/2010  . HYPERTENSION 07/25/2007     Allergies  Allergen Reactions  . Doxycycline Other (See Comments)    Headaches    ROS General: Denies fever, chills, night sweats, changes in weight, changes in appetite +dizziness HEENT: Denies headaches, ear pain, changes in vision, rhinorrhea, sore throat +pulsing sensation in ears CV: Denies CP, palpitations, SOB, orthopnea Pulm: Denies SOB, cough, wheezing GI: Denies abdominal pain, nausea, vomiting, diarrhea, constipation +dark stools, GIB GU: Denies dysuria, hematuria, frequency Msk: Denies muscle cramps, joint pains Neuro: Denies weakness, numbness, tingling Skin: Denies rashes, bruising Psych: Denies depression, anxiety, hallucinations     Objective:    Blood pressure 122/80, pulse 86, temperature 97.9 F (36.6 C), temperature source Oral, height 5\' 11"  (1.803 m), weight 171 lb 6.4 oz (77.7 kg), SpO2 95 %.  Gen. Pleasant, well-nourished, in no distress, normal affect   HEENT: [place]/AT, face symmetric, conjunctiva clear, no scleral icterus, PERRLA, EOMI, nares patent without drainage Lungs: no accessory muscle use, CTAB, no wheezes or rales Cardiovascular: RRR, no m/r/g, no peripheral edema Abdomen: BS hyperactive, soft, NT/ND, no hepatosplenomegaly. Musculoskeletal: No deformities, no cyanosis or clubbing, normal tone Neuro:  A&Ox3, CN II-XII intact, normal gait Skin:  Warm, no lesions/ rash   Wt Readings from Last 3 Encounters:  02/14/21 171 lb 6.4 oz (77.7 kg)  02/11/21 163 lb 11.2 oz (74.3 kg)  10/16/20 182 lb 12.8 oz (82.9 kg)    Lab Results  Component Value Date   WBC 5.3 02/09/2021   HGB 8.6 (L) 02/10/2021   HCT 26.0 (L) 02/10/2021   PLT 284 02/09/2021  GLUCOSE 92 02/11/2021   CHOL 220 (H) 10/09/2010   TRIG 62.0 10/09/2010   HDL 42.50 10/09/2010   LDLDIRECT 163.0 10/09/2010   ALT 28 01/29/2021   AST 22 01/29/2021   NA 137 02/11/2021   K 4.0 02/11/2021   CL 106 02/11/2021   CREATININE 0.91 02/11/2021   BUN 9 02/11/2021   CO2 25 02/11/2021    TSH 0.89 10/09/2010   PSA 1.47 10/09/2010   INR 1.3 (H) 02/04/2021    Assessment/Plan:  Acute lower GI bleeding  -stable -will repeat CBC.  H/H 8.6/26.0 on 02/10/21 -unable to identify active bleed on EGD, colonoscopy, capsule endoscopy, tagged RBC scan during hospitalization. -will see if GI f/u can be moved up as currently 04/01/21. -given strict precautions - Plan: CBC with Differential/Platelet, Ambulatory referral to Gastroenterology  Diverticulosis of colon with hemorrhage  - Plan: Basic metabolic panel, Ambulatory referral to Gastroenterology  Symptomatic anemia we will repeat CBC -Patient with dizziness -H/H 8.6/26.0 on 02/10/2021 -Given strict precautions -Further recommendations based on lab. - Plan: CBC with Differential/Platelet, Ambulatory referral to Gastroenterology   -H&H from today with mild improvement 9.3 /28.1  Pt notified of result by this provider at 5:13 pm 02/14/21.  H. pylori infection -Noted on gastric biopsy -Continue amoxicillin, Protonix,flagyl, clarithromycin  Superficial thrombophlebitis of left upper extremity -Continue Zyvox 600 mg  -continue follow-up with ID -Given precautions  Hospital follow up -Labs, imaging hospitalization reviewed  F/u prn in the next wk  Grier Mitts, MD

## 2021-02-14 NOTE — Telephone Encounter (Signed)
The pt has been scheduled for 03/13/21 at 150 pm.  Dr Volanda Napoleon office will notify the pt.

## 2021-02-14 NOTE — Patient Instructions (Addendum)
https://www.ncbi.nlm.nih.gov/books/NBK537291/#:~:text=Interventional%20Radiology-,Deterrence%20and%20Patient%20Education,also%20known%20as%20the%20colon).">  Lower Gastrointestinal Bleeding  Lower gastrointestinal (GI) bleeding is the result of bleeding from the colon, the rectum, or the area of the opening of the buttocks (anal area). The colon is the last part of the digestive tract, where stool (feces) is formed. A person with lower GI bleeding may see blood in or on his or her stool. The blood may be bright red. Lower GI bleeding often stops without treatment. Continued or heavy bleeding may be life-threatening and needs emergency treatment at a hospital. What are the causes? This condition may be caused by:  Diverticulosis. This condition causes pouches to form in your colon over time.  Diverticulitis. This is inflammation in areas where diverticulosis has occurred.  Inflammatory bowel disease (IBD), or inflammation of the colon.  Hemorrhoids, or swollen veins in the rectum.  Anal fissures, or painful tears in the anus. Tears are often caused by passing hard stools.  Cancer of the colon or rectum, or polyps of the colon or rectum. Polyps are noncancerous growths.  Coagulopathy. This is a disorder that makes it hard to form blood clots and causes easy bleeding.  Arteriovenous malformation. This is an abnormal weakening of a blood vessel where an artery and a vein come together. What increases the risk? The following factors may make you more likely to develop this condition:  Being older than age 39.  Regular use of aspirin or NSAIDs, such as ibuprofen.  Taking blood thinners (anticoagulants) or anti-platelet medicines.  Having a history of high-dose X-ray treatment (radiation therapy) of the colon.  Having recently had a colon polyp removed.  Heavy use of alcohol, or use of nicotine products. What are the signs or symptoms? Symptoms of this condition include:  Bright red  blood or blood clots coming from your rectum.  Bloody stools.  Black or maroon-colored stools.  Pain or cramping in your abdomen.  Weakness or dizziness.  Racing heartbeat. How is this diagnosed? This condition may be diagnosed based on:  Your symptoms and medical history.  A physical exam. During the exam, your health care provider will check for signs of blood loss, such as low blood pressure and a fast pulse.  Tests or procedures. These may include: ? Flexible sigmoidoscopy. In this procedure, a flexible tube with a camera on the end is used to examine your anus and the first part of your colon to look for the source of bleeding. ? Colonoscopy. This is similar to a flexible sigmoidoscopy, but the camera can extend all the way to the uppermost part of your colon. ? Blood tests to measure your red blood cell count and to check for coagulopathy. ? Angiogram. For this test, X-rays of your colon are taken after a dye or radioactive substance is injected into your bloodstream. This may be done to look for a bleeding site. How is this treated? Treatment for this condition depends on the cause of your bleeding. Heavy or ongoing bleeding is treated at a hospital. Treatment may include:  Getting fluids through an IV inserted into one of your veins.  Getting blood through an IV (blood transfusion).  Stopping bleeding with high heat (coagulation), injections of certain medicines, or surgical clips. This can all be done during a colonoscopy.  Having a procedure that involves first doing an angiogram and then blocking blood flow to the bleeding site (embolization).  Stopping some of your regular medicines for a certain amount of time.  Having surgery to remove part of your colon. This may  be needed if bleeding is severe and does not lessen after other treatment. Follow these instructions at home: Eating and drinking  Eat foods that are high in fiber. This will help keep your stools soft.  These foods include whole grains, legumes, fruits, and vegetables. Eating 1-3 prunes a day works well for many people.  Drink enough fluid to keep your urine pale yellow.  Do not drink alcohol.   General instructions  Do not use any products that contain nicotine or tobacco, such as cigarettes, e-cigarettes, and chewing tobacco. If you need help quitting, ask your health care provider.  Take over-the-counter and prescription medicines only as told by your health care provider. You may need to avoid aspirin, NSAIDs, or other medicines that increase bleeding.  Return to your normal activities as told by your health care provider. Ask your health care provider what activities are safe for you.  Keep all follow-up visits as told by your health care provider. This is important.   Contact a health care provider if:  Your symptoms do not improve.  You feel nauseous, or you vomit.  You have pain in your abdomen.  You feel weak or dizzy.  You need help to stop smoking or drinking alcohol. Get help right away if:  Your bleeding increases.  You have severe weakness or you faint.  You have sudden or severe cramps in your back or abdomen.  You vomit blood.  You pass large blood clots in your stool.  Any of your other symptoms get worse. These symptoms may represent a serious problem that is an emergency. Do not wait to see if the symptoms will go away. Get medical help right away. Call your local emergency services (911 in the [place] Do not drive yourself to the hospital. Summary  If you have lower gastrointestinal (GI) bleeding, you may see blood in or on your stool (feces). The blood may be bright red.  Take over-the-counter and prescription medicines only as told by your health care provider. You may need to avoid aspirin, NSAIDs, or other medicines that increase bleeding.  Keep all follow-up visits as told by your health care provider. This is important.  Get help right away if  your symptoms get worse. This information is not intended to replace advice given to you by your health care provider. Make sure you discuss any questions you have with your health care provider. Document Revised: 12/09/2019 Document Reviewed: 12/09/2019 Elsevier Patient Education  2021 [place].  Helicobacter Pylori Infection Helicobacter pylori infection is a bacterial infection in the stomach. Long-term (chronic) infection can cause stomach irritation (gastritis), ulcers in the stomach (gastric ulcers), and ulcers in the upper part of the intestine (duodenal ulcers). Having this infection may also increase your risk of stomach cancer and a type of white blood cell cancer (lymphoma) that affects the stomach. What are the causes? This infection is caused by the Helicobacter pylori (H. pylori) bacteria. Many healthy people have this bacteria in their stomach lining. The bacteria may also spread from person to person through contact with stool (feces) or saliva. It is not known why some people develop ulcers, gastritis, or cancer from the bacteria. What increases the risk? You are more likely to develop this condition if you:  Have family members with the infection.  Live with many other people, such as in a dormitory.  Are of African, Hispanic, or Asian descent. What are the signs or symptoms? Most people with this infection do not have any  symptoms. If you do have symptoms, they may include:  Heartburn.  Stomach pain.  Nausea.  Vomiting. The vomit may be bloody because of ulcers.  Loss of appetite.  Bad breath. How is this diagnosed? This condition may be diagnosed based on:  Your symptoms and medical history.  A physical exam.  Blood tests.  Stool tests.  A breath test.  A procedure that involves placing a tube with a camera on the end of it down your throat to examine your stomach and upper intestine (upper endoscopy).  Removing and testing a tissue sample from  the stomach lining (biopsy). A biopsy may be taken during an upper endoscopy. How is this treated? This condition is treated by taking a combination of medicines (triple therapy) for several weeks. Triple therapy includes one medicine to reduce the amount of acid in your stomach and two types of antibiotic medicines. This treatment may reduce your risk of cancer. You may need to be tested for H. pylori again after treatment. In some cases, the treatment may need to be repeated if your treatment did not get rid of all the bacteria.   Follow these instructions at home:  Take over-the-counter and prescription medicines only as told by your health care provider.  Take your antibiotics as told by your health care provider. Do not stop taking the antibiotics even if you start to feel better.  Return to your normal activities as told by your health care provider. Ask your health care provider what activities are safe for you.  Take steps to prevent future infections: ? Wash your hands often with soap and water. If soap and water are not available, use hand sanitizer. ? Do not eat food or drink water that may have had contact with stool or saliva.  Keep all follow-up visits as told by your health care provider. This is important. You may need tests to make sure your treatment worked.   Contact a health care provider if your symptoms:  Do not get better with treatment.  Return after treatment. Summary  Helicobacter pylori infection is a stomach infection caused by the Helicobacter pylori (H. pylori) bacteria.  This infection can cause stomach irritation (gastritis), ulcers in the stomach (gastric ulcers), and ulcers in the upper part of the intestine (duodenal ulcers).  This condition is treated by taking a combination of medicines (triple therapy) for several weeks.  Take your antibiotics as told by your health care provider. Do not stop taking the antibiotics even if you start to feel  better. This information is not intended to replace advice given to you by your health care provider. Make sure you discuss any questions you have with your health care provider. Document Revised: 04/06/2019 Document Reviewed: 12/07/2017 Elsevier Patient Education  2021 [place].  Thrombophlebitis Thrombophlebitis is a condition in which a blood clot forms in a vein. This can happen in your arms or legs, or in the area between your neck and groin (torso). When this condition happens in a vein that is close to the surface of the body (superficial thrombophlebitis), it is usually not serious.However, when the condition happens in a vein that is deep inside the body (deep vein thrombosis, DVT), it can cause serious problems. What are the causes? This condition may be caused by:  Damage to a vein.  Inflammation of the veins.  A condition that causes blood to clot more easily.  Reduced blood flow through the veins. What increases the risk? The following factors may  make you more likely to develop this condition:  Having a condition that makes blood thicker or more likely to clot.  Having an infection.  Having major surgery.  Experiencing a traumatic injury or a broken bone.  Having a catheter in a vein (central line).  Having a condition in which valves in the veins do not work properly, causing blood to collect (pool) in the veins (chronic venous insufficiency).  An inactive (sedentary) lifestyle.  Pregnancy or having recently given birth.  Cancer.  Older age, especially being 26 or older.  Obesity.  Smoking.  Taking medicines that contain estrogen, such as birth control pills.  Having varicose veins.  Using drugs that are injected into the veins (intravenous, IV). What are the signs or symptoms? The main symptoms of this condition are:  Swelling and pain in an arm or leg. If the affected vein is in the leg, you may feel pain while standing or walking.  Warmth  or redness in an arm or leg. Other symptoms include:  Low-grade fever.  Muscle aches.  A bulging vein (venous distension). In some cases, there are no symptoms. How is this diagnosed? This condition may be diagnosed based on:  Your symptoms and medical history.  A physical exam.  Tests, such as: ? Blood tests. ? A test that uses sound waves to make images (ultrasound). How is this treated? Treatment depends on how severe the condition is and which area of the body is affected. Treatment may include:  Applying a warm compress or heating pad to affected areas.  Wearing compression stockings to help prevent blood clots and reduce swelling in your legs.  Raising (elevating) the affected arm or leg above the level of your heart.  Medicines, such as: ? Anti-inflammatory medicines, such as ibuprofen. ? Blood thinners (anticoagulants), such as heparin. ? Antibiotic medicine, if you have an infection.  Removing an IV that may be causing the problem. In rare cases, surgery may be needed to:  Remove a damaged section of a vein.  Place a filter in a large vein to catch blood clots before they reach the lungs. Follow these instructions at home: Medicines  Take over-the-counter and prescription medicines only as told by your health care provider.  If you were prescribed an antibiotic, take it as told by your health care provider. Do not stop using the antibiotic even if you feel better. Managing pain, stiffness, and swelling  If directed, put heat on the affected area as often as told by your health care provider. Use the heat source that your health care provider recommends, such as a moist heat pack or a heating pad. ? Place a towel between your skin and the heat source. ? Leave the heat on for 20-30 minutes. ? Remove the heat if your skin turns bright red. This is especially important if you are not able to feel pain, heat, or cold. You may have a greater risk of getting  burned.  Elevate the affected area above the level of your heart while you are sitting or lying down.   Activity  Return to your normal activities as told by your health care provider. Ask your health care provider what activities are safe for you.  Avoid sitting or lying down for long periods. If possible, stand up and walk around regularly. If you are taking blood thinners:  Take your medicine exactly as told, at the same time every day.  Avoid activities that could cause injury or bruising, and follow  instructions about how to prevent falls.  Wear a medical alert bracelet or carry a card that lists what medicines you take. General instructions  Drink enough fluid to keep your urine pale yellow.  Wear compression stockings as told by your health care provider.  Do not use any products that contain nicotine or tobacco, such as cigarettes and e-cigarettes. If you need help quitting, ask your health care provider.  Keep all follow-up visits as told by your health care provider. This is important. Contact a health care provider if:  You miss a dose of your blood thinner, if applicable.  Your symptoms do not improve.  You have unusual bruising.  You have nausea, vomiting, or diarrhea that lasts for more than one day. Get help right away if:  You have any of these problems: ? New or worse pain, swelling, or redness in an arm or leg. ? Numbness or tingling in an arm or leg. ? Shortness of breath. ? Chest pain. ? Severe pain in your abdomen. ? Fast breathing. ? A fast or irregular heartbeat. ? Blood in your vomit, stool, or urine. ? A severe headache or confusion. ? A cut that does not stop bleeding.  You feel light-headed or dizzy.  You cough up blood.  You have a serious fall or accident, or you hit your head. These symptoms may represent a serious problem that is an emergency. Do not wait to see if the symptoms will go away. Get medical help right away. Call your  local emergency services (911 in the [place] Do not drive yourself to the hospital. Summary  Thrombophlebitis is a condition in which a blood clot forms in a vein. This can happen in a vein close to the surface of the body or a vein deep inside the body.  This condition can cause serious problems when it happens in a vein deep inside the body (deep vein thrombosis, DVT).  The main symptom of this condition is swelling and pain around the affected vein.  Treatment may include warm compresses, anti-inflammatory medicines, or blood thinners. This information is not intended to replace advice given to you by your health care provider. Make sure you discuss any questions you have with your health care provider. Document Revised: 05/06/2020 Document Reviewed: 05/06/2020 Elsevier Patient Education  [place].

## 2021-02-14 NOTE — Telephone Encounter (Signed)
Jay Robertson from pt's PCP Dr. Volanda Napoleon called requesting pt to be seen before 04/01/2021 per Dr. Volanda Napoleon request due to pt still experiencing some bleeding. Pt was seen at PCP office today. Any further questions pls call PCP phone at [phone] and ask for Jay Robertson.

## 2021-02-17 DIAGNOSIS — M25562 Pain in left knee: Secondary | ICD-10-CM | POA: Diagnosis not present

## 2021-02-17 DIAGNOSIS — S93402A Sprain of unspecified ligament of left ankle, initial encounter: Secondary | ICD-10-CM | POA: Diagnosis not present

## 2021-02-19 ENCOUNTER — Encounter: Payer: Self-pay | Admitting: Internal Medicine

## 2021-02-19 ENCOUNTER — Ambulatory Visit (INDEPENDENT_AMBULATORY_CARE_PROVIDER_SITE_OTHER): Payer: Medicare Other | Admitting: Internal Medicine

## 2021-02-19 ENCOUNTER — Other Ambulatory Visit: Payer: Self-pay

## 2021-02-19 ENCOUNTER — Inpatient Hospital Stay: Payer: Federal, State, Local not specified - PPO | Admitting: Family Medicine

## 2021-02-19 VITALS — BP 124/77 | HR 81 | Temp 97.7°F | Ht 71.0 in | Wt 174.0 lb

## 2021-02-19 DIAGNOSIS — K279 Peptic ulcer, site unspecified, unspecified as acute or chronic, without hemorrhage or perforation: Secondary | ICD-10-CM

## 2021-02-19 DIAGNOSIS — I809 Phlebitis and thrombophlebitis of unspecified site: Secondary | ICD-10-CM

## 2021-02-19 DIAGNOSIS — B9681 Helicobacter pylori [H. pylori] as the cause of diseases classified elsewhere: Secondary | ICD-10-CM

## 2021-02-19 NOTE — Patient Instructions (Signed)
Your thrombophlebitis seems adequately treated  If there is fever, chill, increased pain/swelling/redness in the left arm area previously affected, please let us know   Finish your h-pylori treatment (amoxicillin, clarithromycin, protonix, and metronidazole). Make sure you see your GI doctor as scheduled. There will need to be a test of cure for this infection which they will check  Thank you

## 2021-02-19 NOTE — Progress Notes (Signed)
[facility]  Patient Active Problem List   Diagnosis Date Noted  . Diverticulosis of colon with hemorrhage   . GI bleed 01/30/2021  . Acute lower GI bleeding 01/29/2021  . Syncope 01/29/2021  . Pure hypercholesterolemia 10/16/2010  . Allergic rhinitis 10/24/2007  . Essential hypertension 07/25/2007      Subjective:    Patient ID: Jay Robertson, male    DOB: [birth year], 65 y.o.   MRN: [id number]  Chief Complaint  Patient presents with  . Follow-up    HPI:  Jay Robertson is a 65 y.o. male seen for hospital discharge for thrombophlebitis f/u   02/19/2021 id clinic f/u Gi f/u 3/17 Has another week of quadruple therapy  I reviewed his medication rx'es and checked with him. It doesn't appear he was given any linezolid on discharge despite ID recommendation. It does appear he had done well though just on amox. No f/c/pain/swelling/redness in the left antecubital fossa. He did have significant swelling/redness and some discomfor in that area prior to treatment He reports some dizziness/unsteadiness taking the quadruple therapy  He does have foot nondisplaced fx from fall the hospital and is wearing a brace. He saw ortho yesterday. He is walking on lle. Stiff after prolonged rest.    Background hospital chart review: --------------  He was initially admitted for gib needing 10 prbc. Bleeding stopped spontaneously; refused meckel's scan and IR intervention. Upper/lower endoscopy no evidence upper gib or lower gib but noted blood throughtout large intestine with diverticulosis  He had a gastric biopsy that was positive for h-pylori and started on amox/clarith/flagyl/protonix for 14 days by GI  He developed thrombophleibitis around peripheral iv site while admitted. bcx negative but taken post abx starting. He was transitioned to 2 weeks linezolid after receiving 4 days vancomycin. linezolid was supposed to go to 2/24  bcx 1 of 2 set during  admission staph hominis thought contaminant on 2/08     Allergies  Allergen Reactions  . Doxycycline Other (See Comments)    Headaches      Outpatient Medications Prior to Visit  Medication Sig Dispense Refill  . amoxicillin (AMOXIL) 500 MG tablet Take 2 tablets (1,000 mg total) by mouth 2 (two) times daily for 14 days. 56 tablet 0  . clarithromycin (BIAXIN) 500 MG tablet Take 1 tablet (500 mg total) by mouth 2 (two) times daily for 14 days. 28 tablet 0  . linezolid (ZYVOX) 600 MG tablet Take 1 tablet (600 mg total) by mouth 2 (two) times daily for 14 days. 28 tablet 0  . metroNIDAZOLE (FLAGYL) 500 MG tablet Take 1 tablet (500 mg total) by mouth 2 (two) times daily for 14 days. 28 tablet 0  . pantoprazole (PROTONIX) 40 MG tablet Take 1 tablet (40 mg total) by mouth 2 (two) times daily before a meal for 14 days. 28 tablet 0   No facility-administered medications prior to visit.     Social History   Socioeconomic History  . Marital status: Single    Spouse name: Not on file  . Number of children: Not on file  . Years of education: Not on file  . Highest education level: Not on file  Occupational History  . Not on file  Tobacco Use  . Smoking status: Never Smoker  . Smokeless tobacco: Never Used  Substance and Sexual Activity  . Alcohol use: Yes    Comment: occ  . Drug use: No  . Sexual  activity: Not on file  Other Topics Concern  . Not on file  Social History Narrative  . Not on file   Social Determinants of Health   Financial Resource Strain: Not on file  Food Insecurity: Not on file  Transportation Needs: Not on file  Physical Activity: Not on file  Stress: Not on file  Social Connections: Not on file  Intimate Partner Violence: Not on file      Review of Systems    Other ros   Objective:    BP 124/77   Pulse 81   Temp 97.7 F (36.5 C) (Oral)   Ht 5\' 11"  (1.803 m)   Wt 174 lb (78.9 kg)   SpO2 100%   BMI 24.27 kg/m  Nursing note and vital  signs reviewed.  Physical Exam Msk: Left ankle brace; left knee mildly tender medially; no effusion; no back pain cv rrr no mrg Lungs clear; normal respiratory effort abd s/nt Ext no edema Skin no rash; left anticubetal fossa nontender, nonswollen, nonerythematous. There is a mobile nontender cord like feeling  Neuro nonfocal       Labs: 2/18 cr 1.03; cbc 4.6/9/468  Micro:  Serology:  Imaging:  Assessment & Plan:   Problem List Items Addressed This Visit   None   Visit Diagnoses    Thrombophlebitis    -  Primary   H pylori ulcer          #thrombophlebitis Clinically resolved Didn't take linezolid as recommended (no rx from hospital) but got 7 days vanc. Platelet a little high likely due to blood loss reaction -repeat cbc with gi in a month  #h-pylori infection dxed via endoscopy On quadruple therapy per gi -f/u gi for test of cure   No orders of the defined types were placed in this encounter.       Follow-up: Return if symptoms worsen or fail to improve.  I spent more than 20 minute reviewing data/chart, and coordinating care and >50% direct face to face time providing counseling/discussing diagnostics/treatment plan with patient     Jay Robertson, [facility] [place] [phone]  pager   [phone] cell 02/19/2021, 11:00 AM

## 2021-02-24 ENCOUNTER — Other Ambulatory Visit: Payer: Self-pay

## 2021-02-24 ENCOUNTER — Ambulatory Visit (INDEPENDENT_AMBULATORY_CARE_PROVIDER_SITE_OTHER): Payer: Medicare Other | Admitting: Family Medicine

## 2021-02-24 ENCOUNTER — Encounter: Payer: Self-pay | Admitting: Family Medicine

## 2021-02-24 VITALS — BP 132/77 | HR 99 | Temp 98.2°F | Ht 71.0 in | Wt 173.6 lb

## 2021-02-24 DIAGNOSIS — M19072 Primary osteoarthritis, left ankle and foot: Secondary | ICD-10-CM

## 2021-02-24 DIAGNOSIS — B9681 Helicobacter pylori [H. pylori] as the cause of diseases classified elsewhere: Secondary | ICD-10-CM

## 2021-02-24 DIAGNOSIS — D649 Anemia, unspecified: Secondary | ICD-10-CM | POA: Diagnosis not present

## 2021-02-24 DIAGNOSIS — I808 Phlebitis and thrombophlebitis of other sites: Secondary | ICD-10-CM | POA: Diagnosis not present

## 2021-02-24 DIAGNOSIS — I10 Essential (primary) hypertension: Secondary | ICD-10-CM | POA: Diagnosis not present

## 2021-02-24 DIAGNOSIS — S82892D Other fracture of left lower leg, subsequent encounter for closed fracture with routine healing: Secondary | ICD-10-CM

## 2021-02-24 DIAGNOSIS — M25562 Pain in left knee: Secondary | ICD-10-CM

## 2021-02-24 DIAGNOSIS — K279 Peptic ulcer, site unspecified, unspecified as acute or chronic, without hemorrhage or perforation: Secondary | ICD-10-CM

## 2021-02-24 NOTE — Patient Instructions (Addendum)
Restart the benazepril  For your blood pressure.  Continue to hold the spironolactone. Decrease your sodium intake. Restart allegra for your allergies.   You can use heat on your left arm. Work on moving your Left knee and ankle to help keep them from becoming stiff. Thrombophlebitis Thrombophlebitis is a condition in which a blood clot forms in a vein. This can happen in your arms or legs, or in the area between your neck and groin (torso). When this condition happens in a vein that is close to the surface of the body (superficial thrombophlebitis), it is usually not serious.However, when the condition happens in a vein that is deep inside the body (deep vein thrombosis, DVT), it can cause serious problems. What are the causes? This condition may be caused by:  Damage to a vein.  Inflammation of the veins.  A condition that causes blood to clot more easily.  Reduced blood flow through the veins. What increases the risk? The following factors may make you more likely to develop this condition:  Having a condition that makes blood thicker or more likely to clot.  Having an infection.  Having major surgery.  Experiencing a traumatic injury or a broken bone.  Having a catheter in a vein (central line).  Having a condition in which valves in the veins do not work properly, causing blood to collect (pool) in the veins (chronic venous insufficiency).  An inactive (sedentary) lifestyle.  Pregnancy or having recently given birth.  Cancer.  Older age, especially being 43 or older.  Obesity.  Smoking.  Taking medicines that contain estrogen, such as birth control pills.  Having varicose veins.  Using drugs that are injected into the veins (intravenous, IV). What are the signs or symptoms? The main symptoms of this condition are:  Swelling and pain in an arm or leg. If the affected vein is in the leg, you may feel pain while standing or walking.  Warmth or redness in an arm  or leg. Other symptoms include:  Low-grade fever.  Muscle aches.  A bulging vein (venous distension). In some cases, there are no symptoms. How is this diagnosed? This condition may be diagnosed based on:  Your symptoms and medical history.  A physical exam.  Tests, such as: ? Blood tests. ? A test that uses sound waves to make images (ultrasound). How is this treated? Treatment depends on how severe the condition is and which area of the body is affected. Treatment may include:  Applying a warm compress or heating pad to affected areas.  Wearing compression stockings to help prevent blood clots and reduce swelling in your legs.  Raising (elevating) the affected arm or leg above the level of your heart.  Medicines, such as: ? Anti-inflammatory medicines, such as ibuprofen. ? Blood thinners (anticoagulants), such as heparin. ? Antibiotic medicine, if you have an infection.  Removing an IV that may be causing the problem. In rare cases, surgery may be needed to:  Remove a damaged section of a vein.  Place a filter in a large vein to catch blood clots before they reach the lungs. Follow these instructions at home: Medicines  Take over-the-counter and prescription medicines only as told by your health care provider.  If you were prescribed an antibiotic, take it as told by your health care provider. Do not stop using the antibiotic even if you feel better. Managing pain, stiffness, and swelling  If directed, put heat on the affected area as often as told by  your health care provider. Use the heat source that your health care provider recommends, such as a moist heat pack or a heating pad. ? Place a towel between your skin and the heat source. ? Leave the heat on for 20-30 minutes. ? Remove the heat if your skin turns bright red. This is especially important if you are not able to feel pain, heat, or cold. You may have a greater risk of getting burned.  Elevate the  affected area above the level of your heart while you are sitting or lying down.   Activity  Return to your normal activities as told by your health care provider. Ask your health care provider what activities are safe for you.  Avoid sitting or lying down for long periods. If possible, stand up and walk around regularly. If you are taking blood thinners:  Take your medicine exactly as told, at the same time every day.  Avoid activities that could cause injury or bruising, and follow instructions about how to prevent falls.  Wear a medical alert bracelet or carry a card that lists what medicines you take. General instructions  Drink enough fluid to keep your urine pale yellow.  Wear compression stockings as told by your health care provider.  Do not use any products that contain nicotine or tobacco, such as cigarettes and e-cigarettes. If you need help quitting, ask your health care provider.  Keep all follow-up visits as told by your health care provider. This is important. Contact a health care provider if:  You miss a dose of your blood thinner, if applicable.  Your symptoms do not improve.  You have unusual bruising.  You have nausea, vomiting, or diarrhea that lasts for more than one day. Get help right away if:  You have any of these problems: ? New or worse pain, swelling, or redness in an arm or leg. ? Numbness or tingling in an arm or leg. ? Shortness of breath. ? Chest pain. ? Severe pain in your abdomen. ? Fast breathing. ? A fast or irregular heartbeat. ? Blood in your vomit, stool, or urine. ? A severe headache or confusion. ? A cut that does not stop bleeding.  You feel light-headed or dizzy.  You cough up blood.  You have a serious fall or accident, or you hit your head. These symptoms may represent a serious problem that is an emergency. Do not wait to see if the symptoms will go away. Get medical help right away. Call your local emergency services  (911 in the [place] Do not drive yourself to the hospital. Summary  Thrombophlebitis is a condition in which a blood clot forms in a vein. This can happen in a vein close to the surface of the body or a vein deep inside the body.  This condition can cause serious problems when it happens in a vein deep inside the body (deep vein thrombosis, DVT).  The main symptom of this condition is swelling and pain around the affected vein.  Treatment may include warm compresses, anti-inflammatory medicines, or blood thinners. This information is not intended to replace advice given to you by your health care provider. Make sure you discuss any questions you have with your health care provider. Document Revised: 05/06/2020 Document Reviewed: 05/06/2020 Elsevier Patient Education  2021 [place] Your Hypertension Hypertension, also called high blood pressure, is when the force of the blood pressing against the walls of the arteries is too strong. Arteries are blood vessels that  carry blood from your heart throughout your body. Hypertension forces the heart to work harder to pump blood and may cause the arteries to become narrow or stiff. Understanding blood pressure readings Your personal target blood pressure may vary depending on your medical conditions, your age, and other factors. A blood pressure reading includes a higher number over a lower number. Ideally, your blood pressure should be below 120/80. You should know that:  The first, or top, number is called the systolic pressure. It is a measure of the pressure in your arteries as your heart beats.  The second, or bottom number, is called the diastolic pressure. It is a measure of the pressure in your arteries as the heart relaxes. Blood pressure is classified into four stages. Based on your blood pressure reading, your health care provider may use the following stages to determine what type of treatment you need, if any. Systolic  pressure and diastolic pressure are measured in a unit called mmHg. Normal  Systolic pressure: below 262.  Diastolic pressure: below 80. Elevated  Systolic pressure: 035-597.  Diastolic pressure: below 80. Hypertension stage 1  Systolic pressure: 416-384.  Diastolic pressure: 53-64. Hypertension stage 2  Systolic pressure: 680 or above.  Diastolic pressure: 90 or above. How can this condition affect me? Managing your hypertension is an important responsibility. Over time, hypertension can damage the arteries and decrease blood flow to important parts of the body, including the brain, heart, and kidneys. Having untreated or uncontrolled hypertension can lead to:  A heart attack.  A stroke.  A weakened blood vessel (aneurysm).  Heart failure.  Kidney damage.  Eye damage.  Metabolic syndrome.  Memory and concentration problems.  Vascular dementia. What actions can I take to manage this condition? Hypertension can be managed by making lifestyle changes and possibly by taking medicines. Your health care provider will help you make a plan to bring your blood pressure within a normal range. Nutrition  Eat a diet that is high in fiber and potassium, and low in salt (sodium), added sugar, and fat. An example eating plan is called the Dietary Approaches to Stop Hypertension (DASH) diet. To eat this way: ? Eat plenty of fresh fruits and vegetables. Try to fill one-half of your plate at each meal with fruits and vegetables. ? Eat whole grains, such as whole-wheat pasta, brown rice, or whole-grain bread. Fill about one-fourth of your plate with whole grains. ? Eat low-fat dairy products. ? Avoid fatty cuts of meat, processed or cured meats, and poultry with skin. Fill about one-fourth of your plate with lean proteins such as fish, chicken without skin, beans, eggs, and tofu. ? Avoid pre-made and processed foods. These tend to be higher in sodium, added sugar, and fat.  Reduce  your daily sodium intake. Most people with hypertension should eat less than 1,500 mg of sodium a day.   Lifestyle  Work with your health care provider to maintain a healthy body weight or to lose weight. Ask what an ideal weight is for you.  Get at least 30 minutes of exercise that causes your heart to beat faster (aerobic exercise) most days of the week. Activities may include walking, swimming, or biking.  Include exercise to strengthen your muscles (resistance exercise), such as weight lifting, as part of your weekly exercise routine. Try to do these types of exercises for 30 minutes at least 3 days a week.  Do not use any products that contain nicotine or tobacco, such as cigarettes, e-cigarettes,  and chewing tobacco. If you need help quitting, ask your health care provider.  Control any long-term (chronic) conditions you have, such as high cholesterol or diabetes.  Identify your sources of stress and find ways to manage stress. This may include meditation, deep breathing, or making time for fun activities.   Alcohol use  Do not drink alcohol if: ? Your health care provider tells you not to drink. ? You are pregnant, may be pregnant, or are planning to become pregnant.  If you drink alcohol: ? Limit how much you use to:  0-1 drink a day for women.  0-2 drinks a day for men. ? Be aware of how much alcohol is in your drink. In the [place] one drink equals one 12 oz bottle of beer (355 mL), one 5 oz glass of wine (148 mL), or one 1 oz glass of hard liquor (44 mL). Medicines Your health care provider may prescribe medicine if lifestyle changes are not enough to get your blood pressure under control and if:  Your systolic blood pressure is 130 or higher.  Your diastolic blood pressure is 80 or higher. Take medicines only as told by your health care provider. Follow the directions carefully. Blood pressure medicines must be taken as told by your health care provider. The medicine does  not work as well when you skip doses. Skipping doses also puts you at risk for problems. Monitoring Before you monitor your blood pressure:  Do not smoke, drink caffeinated beverages, or exercise within 30 minutes before taking a measurement.  Use the bathroom and empty your bladder (urinate).  Sit quietly for at least 5 minutes before taking measurements. Monitor your blood pressure at home as told by your health care provider. To do this:  Sit with your back straight and supported.  Place your feet flat on the floor. Do not cross your legs.  Support your arm on a flat surface, such as a table. Make sure your upper arm is at heart level.  Each time you measure, take two or three readings one minute apart and record the results. You may also need to have your blood pressure checked regularly by your health care provider.   General information  Talk with your health care provider about your diet, exercise habits, and other lifestyle factors that may be contributing to hypertension.  Review all the medicines you take with your health care provider because there may be side effects or interactions.  Keep all visits as told by your health care provider. Your health care provider can help you create and adjust your plan for managing your high blood pressure. Where to find more information  National Heart, Lung, and Blood Institute: https://wilson-eaton.com/  American Heart Association: www.heart.org Contact a health care provider if:  You think you are having a reaction to medicines you have taken.  You have repeated (recurrent) headaches.  You feel dizzy.  You have swelling in your ankles.  You have trouble with your vision. Get help right away if:  You develop a severe headache or confusion.  You have unusual weakness or numbness, or you feel faint.  You have severe pain in your chest or abdomen.  You vomit repeatedly.  You have trouble breathing. These symptoms may  represent a serious problem that is an emergency. Do not wait to see if the symptoms will go away. Get medical help right away. Call your local emergency services (911 in the [place] Do not drive yourself to the hospital. Summary  Hypertension is when the force of blood pumping through your arteries is too strong. If this condition is not controlled, it may put you at risk for serious complications.  Your personal target blood pressure may vary depending on your medical conditions, your age, and other factors. For most people, a normal blood pressure is less than 120/80.  Hypertension is managed by lifestyle changes, medicines, or both.  Lifestyle changes to help manage hypertension include losing weight, eating a healthy, low-sodium diet, exercising more, stopping smoking, and limiting alcohol. This information is not intended to replace advice given to you by your health care provider. Make sure you discuss any questions you have with your health care provider. Document Revised: 01/19/2020 Document Reviewed: 11/14/2019 Elsevier Patient Education  2021 [place].  Ankle Fracture Rehab After an ankle fracture, you can lose ankle mobility and muscle strength and endurance. The following exercises will help resolve these problems. Ask your health care provider which exercises are safe for you. Do exercises exactly as told by your health care provider and adjust them as directed. It is normal to feel mild stretching, pulling, tightness, or discomfort as you do these exercises. Stop right away if you feel sudden pain or your pain gets worse. Do not begin these exercises until told by your health care provider. Stretching and range-of-motion exercises These exercises warm up your muscles and joints and improve the movement and flexibility of your ankle. These exercises may also help to relieve pain and swelling. Passive ankle eversion This is an exercise in which you use your hands to move your  ankle away from you. 1. Sit with your left / right ankle crossed over your opposite knee. 2. Hold your foot with your opposite hand. 3. Gently push the top of your foot downward so your big toe moves away from you and toward the floor (eversion). Keep your lower leg steady. You should feel a gentle stretch on the inside of your ankle. 4. Hold the stretch for __________ seconds. Repeat __________ times. Complete this exercise __________ times a day.   Passive ankle inversion This is an exercise in which you use your hands to move your ankle toward you. 1. Sit with your left / right ankle crossed over your opposite knee. 2. Hold your foot with your opposite hand. 3. Gently pull your foot upward toward you (inversion). Your big toe will move toward you and toward the ceiling. Keep your lower leg steady. You should feel a gentle stretch on the outside of your ankle. 4. Hold the stretch for __________ seconds. Repeat __________ times. Complete this exercise __________ times a day.   Ankle alphabet 1. Sit with your left / right lower leg supported at the lower leg. ? Do not rest your foot on anything. Let your foot dangle. ? Make sure your foot has room to move freely. 2. Think of your foot as a paintbrush. ? Move your foot to trace each letter of the alphabet in the air. Keep your hip and knee still while you trace. ? Make the letters as large as you can without feeling discomfort. 3. Trace every letter of the alphabet. Repeat __________ times. Complete this exercise __________ times a day.   Gastrocnemius stretch This exercise is also called calf stretch. It stretches the muscles in the calf (gastrocnemius muscle). 1. Sit on the floor with your left / right leg straight. 2. Loop a belt or towel around the ball of your foot. 3. Keep your foot relaxed and  keep your knee straight while you use the belt or towel to pull your foot and ankle toward you. Keep your toes pointing upward. You should feel  a gentle stretch behind your ankle or in your calf. 4. Hold this position for __________ seconds. Repeat __________ times. Complete this exercise __________ times a day.   Strengthening exercises These exercises build strength and endurance in your foot and ankle. Endurance is the ability to use your muscles for a long time, even after they get tired. Towel curls 1. Sit in a chair on a non-carpeted surface, and put your feet on the floor. 2. Place a towel in front of your feet. If told by your health care provider, add __________ to the end of the towel to give it weight. 3. Keeping your heel on the floor, put your left / right foot on or just behind the towel. 4. Pull the towel toward you by grabbing the towel with your toes and curling them under. Keep your heel on the floor. 5. Let your toes relax. 6. Grab the towel with your toes again. Inch the towel toward your heel. Keep going until the towel is completely underneath your foot. Repeat __________ times. Complete this exercise __________ times a day.   This information is not intended to replace advice given to you by your health care provider. Make sure you discuss any questions you have with your health care provider. Document Revised: 10/03/2020 Document Reviewed: 10/03/2020 Elsevier Patient Education  2021 [employer].

## 2021-02-24 NOTE — Progress Notes (Signed)
Subjective:    Patient ID: Jay Robertson, male    DOB: [birth year], 65 y.o.   MRN: [id number]  Chief Complaint  Patient presents with  . Follow-up    HPI Patient was seen today for f/u.  Hospitalized 2/2-2/15/22 for GIB with symptomatic anemia, unable to identify active bleed.  Gastric biopsies + for H. pylori.  Notes bp elevation 140/91, 149/95, 150/97, 158/91, 165/95, 174/106, 100/69, 119/73, 127/79, 137/88, 152/86 since benazepril and spironolactone held.  Pt eating more fried fish and olives.  Pt can feel pulse in his ear. Pt concerned about LUE thrombophlebitis, area is hard.  Pt never received rx for zyvox at time of hospital d/c.  Pt notes L ankle pain improving s/p fall causing avulsion fx. L knee stiff from the fall.  Pt inquires about starting iron pills. GI follow up scheduled 3/17.    Past Medical History:  Diagnosis Date  . ALLERGIC RHINITIS 10/24/2007  . HYPERLIPIDEMIA 10/16/2010  . HYPERTENSION 07/25/2007    Allergies  Allergen Reactions  . Doxycycline Other (See Comments)    Headaches    ROS General: Denies fever, chills, night sweats, changes in weight, changes in appetite HEENT: Denies headaches, ear pain, changes in vision, rhinorrhea, sore throat +pulsation in L ear CV: Denies CP, palpitations, SOB, orthopnea Pulm: Denies SOB, cough, wheezing GI: Denies abdominal pain, nausea, vomiting, diarrhea, constipation GU: Denies dysuria, hematuria, frequency Msk: Denies muscle cramps, joint pains + left knee pain/stiffness Neuro: Denies weakness, numbness, tingling Skin: Denies rashes, bruising  +firmness of L arm Psych: Denies depression, anxiety, hallucinations    Objective:    Blood pressure 132/77, pulse 99, temperature 98.2 F (36.8 C), temperature source Oral, height 5\' 11"  (1.803 m), weight 173 lb 9.6 oz (78.7 kg), SpO2 99 %. Initial bp 140/80  Gen. Pleasant, well-nourished, in no distress, normal affect   HEENT: [place]/AT, face symmetric, conjunctiva clear, no  scleral icterus, PERRLA, EOMI, nares patent without drainage Lungs: no accessory muscle use, CTAB, no wheezes or rales Cardiovascular: RRR, no m/r/g, no peripheral edema Abdomen: BS present, soft, NT/ND. Musculoskeletal: Firm mass noted in LUE in [facility] at site of thrombophlebitis. No TTP of L knee or ankle.  No deformities, no cyanosis or clubbing, normal tone Neuro:  A&Ox3, CN II-XII intact, normal gait Skin:  Warm, no lesions/ rash   Wt Readings from Last 3 Encounters:  02/24/21 173 lb 9.6 oz (78.7 kg)  02/19/21 174 lb (78.9 kg)  02/14/21 171 lb 6.4 oz (77.7 kg)    Lab Results  Component Value Date   WBC 4.6 02/14/2021   HGB 9.3 (L) 02/14/2021   HCT 28.1 (L) 02/14/2021   PLT 468.0 (H) 02/14/2021   GLUCOSE 102 (H) 02/14/2021   CHOL 220 (H) 10/09/2010   TRIG 62.0 10/09/2010   HDL 42.50 10/09/2010   LDLDIRECT 163.0 10/09/2010   ALT 28 01/29/2021   AST 22 01/29/2021   NA 139 02/14/2021   K 4.1 02/14/2021   CL 106 02/14/2021   CREATININE 1.03 02/14/2021   BUN 9 02/14/2021   CO2 26 02/14/2021   TSH 0.89 10/09/2010   PSA 1.47 10/09/2010   INR 1.3 (H) 02/04/2021    Assessment/Plan:  Essential hypertension -elevated -repeat bp 132/77 -discussed the importance of lifestyle modificaitons -will restart benazepril 20 mg daily -will continue to hold spironolactone.  -continue checking bp daily.  Symptomatic anemia -improving -hgb 9.3 on 02/14/21 -continue intake of iron rich foods -encouraged to keep f/u appt with GI  Superficial  thrombophlebitis of left upper extremity -continue supportive care including heat -continue abx -f/u with ID  H pylori ulcer -as noted on biopsies from EGD -continue triple therapy -continue f/u with GI  Acute pain of left knee -stiffness s/p fall -discussed supportive care including stretching, topical analgesics, heat -consider imaging -continue to monitor  Osteoarthritis of left foot, unspecified osteoarthritis type -noted on xray  02/10/21 -xray with severe OA of first MTP joint, chronic posttraumatic change vs marginal erosion medial aspect of first proximal phalanx.  Closed avulsion fracture of left ankle with routine healing, subsequent encounter -noted on xray of L foot 02/10/21.  Possible small avulsion fx at dorsal proximal margin of the tarsal navicular, only on lateral view.  F/u prn  Grier Mitts, MD

## 2021-03-13 ENCOUNTER — Ambulatory Visit (INDEPENDENT_AMBULATORY_CARE_PROVIDER_SITE_OTHER): Payer: Medicare Other | Admitting: Gastroenterology

## 2021-03-13 ENCOUNTER — Other Ambulatory Visit: Payer: Federal, State, Local not specified - PPO

## 2021-03-13 ENCOUNTER — Encounter: Payer: Self-pay | Admitting: Nurse Practitioner

## 2021-03-13 ENCOUNTER — Encounter: Payer: Self-pay | Admitting: Gastroenterology

## 2021-03-13 ENCOUNTER — Ambulatory Visit (INDEPENDENT_AMBULATORY_CARE_PROVIDER_SITE_OTHER): Payer: Federal, State, Local not specified - PPO | Admitting: Nurse Practitioner

## 2021-03-13 VITALS — BP 152/80 | HR 88 | Ht 71.0 in | Wt 181.0 lb

## 2021-03-13 VITALS — BP 152/80 | HR 88 | Ht 71.0 in | Wt 181.4 lb

## 2021-03-13 DIAGNOSIS — Z86010 Personal history of colonic polyps: Secondary | ICD-10-CM | POA: Diagnosis not present

## 2021-03-13 DIAGNOSIS — K5731 Diverticulosis of large intestine without perforation or abscess with bleeding: Secondary | ICD-10-CM | POA: Diagnosis not present

## 2021-03-13 DIAGNOSIS — Z8711 Personal history of peptic ulcer disease: Secondary | ICD-10-CM | POA: Diagnosis not present

## 2021-03-13 DIAGNOSIS — Z8619 Personal history of other infectious and parasitic diseases: Secondary | ICD-10-CM

## 2021-03-13 DIAGNOSIS — Z8719 Personal history of other diseases of the digestive system: Secondary | ICD-10-CM | POA: Diagnosis not present

## 2021-03-13 NOTE — Progress Notes (Deleted)
     03/13/2021 JULUIS FITZSIMMONS [id number] 12/21/1956   Chief Complaint:  History of Present Illness: Jay Robertson is a 65 year old male with a past medical history of   Past Medical History:  Diagnosis Date  . ALLERGIC RHINITIS 10/24/2007  . HYPERLIPIDEMIA 10/16/2010  . HYPERTENSION 07/25/2007       Current Medications, Allergies, Past Medical History, Past Surgical History, Family History and Social History were reviewed in [employer] record.   Review of Systems:   Constitutional: Negative for fever, sweats, chills or weight loss.  Respiratory: Negative for shortness of breath.   Cardiovascular: Negative for chest pain, palpitations and leg swelling.  Gastrointestinal: See HPI.  Musculoskeletal: Negative for back pain or muscle aches.  Neurological: Negative for dizziness, headaches or paresthesias.    Physical Exam: There were no vitals taken for this visit. General: Well developed, w   ***male in no acute distress. Head: Normocephalic and atraumatic. Eyes: No scleral icterus. Conjunctiva pink . Ears: Normal auditory acuity. Mouth: Dentition intact. No ulcers or lesions.  Lungs: Clear throughout to auscultation. Heart: Regular rate and rhythm, no murmur. Abdomen: Soft, nontender and nondistended. No masses or hepatomegaly. Normal bowel sounds x 4 quadrants.  Rectal: *** Musculoskeletal: Symmetrical with no gross deformities. Extremities: No edema. Neurological: Alert oriented x 4. No focal deficits.  Psychological: Alert and cooperative. Normal mood and affect  Assessment and Recommendations: ***

## 2021-03-13 NOTE — Patient Instructions (Signed)
START : Over the counter Ferrous Sulfate once daily.   If you are age 65 or older, your body mass index should be between 23-30. Your Body mass index is 25.3 kg/m. If this is out of the aforementioned range listed, please consider follow up with your Primary Care Provider.  If you are age 33 or younger, your body mass index should be between 19-25. Your Body mass index is 25.3 kg/m. If this is out of the aformentioned range listed, please consider follow up with your Primary Care Provider.    Your provider has requested that you go to the basement level for lab work before leaving today. Press "B" on the elevator. The lab is located at the first door on the left as you exit the elevator.   You will need a 6 month follow-up with Dr.Mansouraty. Office will call at a later time with an appointment.   Due to recent changes in healthcare laws, you may see the results of your imaging and laboratory studies on MyChart before your provider has had a chance to review them.  We understand that in some cases there may be results that are confusing or concerning to you. Not all laboratory results come back in the same time frame and the provider may be waiting for multiple results in order to interpret others.  Please give [place] 48 hours in order for your provider to thoroughly review all the results before contacting the office for clarification of your results.   Thank you for choosing me and [facility].  Dr. Rush Landmark

## 2021-03-13 NOTE — Patient Instructions (Signed)
If you are age 65 or older, your body mass index should be between 23-30. Your There is no height or weight on file to calculate BMI. If this is out of the aforementioned range listed, please consider follow up with your Primary Care Provider.  If you are age 52 or younger, your body mass index should be between 19-25. Your There is no height or weight on file to calculate BMI. If this is out of the aformentioned range listed, please consider follow up with your Primary Care Provider.    Your provider has requested that you go to the basement level for lab work before leaving today. Press "B" on the elevator. The lab is located at the first door on the left as you exit the elevator.  Due to recent changes in healthcare laws, you may see the results of your imaging and laboratory studies on MyChart before your provider has had a chance to review them.  We understand that in some cases there may be results that are confusing or concerning to you. Not all laboratory results come back in the same time frame and the provider may be waiting for multiple results in order to interpret others.  Please give [place] 48 hours in order for your provider to thoroughly review all the results before contacting the office for clarification of your results.   Follow-up in 6 months. Office to call with an appointment.   Thank you for choosing me and [facility].  Dr. Rush Landmark

## 2021-03-14 LAB — HELICOBACTER PYLORI  SPECIAL ANTIGEN
MICRO NUMBER:: 11659915
SPECIMEN QUALITY: ADEQUATE

## 2021-03-18 NOTE — Progress Notes (Signed)
[place] VISIT   Primary Care Provider Billie Ruddy, MD [place] [address] [phone]  Patient Profile: Jay Robertson is a 65 y.o. male with a pmh significant for hypertension, hyperlipidemia (diet-controlled), diverticulosis (recent hospitalization for presumed lower GI bleeding), colon polyps, H. pylori infection (awaiting eradication), PUD (manifested as GU).  The patient presents to the [facility] for an evaluation and management of problem(s) noted below:  Problem List 1. Diverticulosis of large intestine with hemorrhage   2. History of lower GI bleeding   3. Hx of colonic polyps   4. History of gastric ulcer   5. History of Helicobacter infection     History of Present Illness This is a patient that I met in the hospital in February.  He returns for scheduled follow-up.  In February he presented with syncope and bright red blood per rectum.  Etiology was felt to likely be diverticular in origin and he initially deferred work-up until he had hematochezia in the emergency department with syncope.  He eventually allowed [place] to pursue an upper and lower endoscopy.  He was found to have a gastric ulcer as well as gastritis and on pathology was found to have H. pylori which was treated.  Colonoscopy showed evidence of pandiverticulosis as well as a colonic polyp (not removed at the time) with no active bleeding at the time.  However within an hour of the completion of his colonoscopy he had recurrent GI bleeding.  He eventually made his way into the intensive care unit.  He required multiple units of packed RBCs over the course of the next week.  He had a stuttering bleeding.  Video capsule endoscopy performed showing evidence of blood refluxing into the TI but no evidence of any other etiology for bleeding at that time.  He was to undergo a provocative angiography but became concerned about the risks and subsequently  deferred on this.  His bleeding stopped.  Since discharge he has not had any further episodes of bleeding and is very happy with that.  He is hopeful that nothing will happen again.  Patient does not take significant nonsteroidals or BC/Goody powders.  He has completed all of his medications as prescribed during his hospitalization.  GI Review of Systems Positive as above Negative for pyrosis, dysphagia, odynophagia, nausea, vomiting, pain, change in bowel habits, melena, hematochezia (at this time)  Review of Systems General: Denies fevers/chills/weight loss unintentionally HEENT: Denies oral lesions Cardiovascular: Denies chest pain/palpitations Pulmonary: Denies shortness of breath Gastroenterological: See HPI Genitourinary: Denies darkened urine or hematuria Hematological: Denies easy bruising/bleeding Endocrine: Denies temperature intolerance Dermatological: Denies jaundice Psychological: Mood is stable   Medications Current Outpatient Medications  Medication Sig Dispense Refill  . benazepril (LOTENSIN) 20 MG tablet Take 20 mg by mouth daily.    Marland Kitchen Fexofenadine-Pseudoephedrine (ALLEGRA-D 24 HOUR PO) Take 1 tablet by mouth daily.    Marland Kitchen spironolactone (ALDACTONE) 25 MG tablet Take 25 mg by mouth daily.     No current facility-administered medications for this visit.    Allergies Allergies  Allergen Reactions  . Doxycycline Other (See Comments)    Headaches    Histories Past Medical History:  Diagnosis Date  . ALLERGIC RHINITIS 10/24/2007  . GI bleed   . HYPERLIPIDEMIA 10/16/2010  . HYPERTENSION 07/25/2007   Past Surgical History:  Procedure Laterality Date  . APPENDECTOMY    . BIOPSY  02/01/2021   Procedure: BIOPSY;  Surgeon: Rush Landmark Telford Nab., MD;  Location: [facility];  Service: Gastroenterology;;  . CLAVICLE SURGERY     possibly as a child? unknown if surgery- broke collarbone  . COLONOSCOPY WITH PROPOFOL N/A 02/01/2021   Procedure: COLONOSCOPY WITH  PROPOFOL;  Surgeon: Rush Landmark Telford Nab., MD;  Location: [facility];  Service: Gastroenterology;  Laterality: N/A;  . ESOPHAGOGASTRODUODENOSCOPY (EGD) WITH PROPOFOL N/A 02/01/2021   Procedure: ESOPHAGOGASTRODUODENOSCOPY (EGD) WITH PROPOFOL;  Surgeon: Rush Landmark Telford Nab., MD;  Location: [employer];  Service: Gastroenterology;  Laterality: N/A;  . GIVENS CAPSULE STUDY N/A 02/03/2021   Procedure: GIVENS CAPSULE STUDY;  Surgeon: Yetta Flock, MD;  Location: [facility];  Service: Gastroenterology;  Laterality: N/A;  . NASAL SEPTUM SURGERY     septal deviation, turbinate hypertrophy and obstruction   Social History   Socioeconomic History  . Marital status: Single    Spouse name: Not on file  . Number of children: Not on file  . Years of education: Not on file  . Highest education level: Not on file  Occupational History  . Not on file  Tobacco Use  . Smoking status: Never Smoker  . Smokeless tobacco: Never Used  . Tobacco comment: pt notes he may have had 3-4 cigars yearly  Substance and Sexual Activity  . Alcohol use: Yes    Comment: occ  . Drug use: No  . Sexual activity: Not on file  Other Topics Concern  . Not on file  Social History Narrative  . Not on file   Social Determinants of Health   Financial Resource Strain: Not on file  Food Insecurity: Not on file  Transportation Needs: Not on file  Physical Activity: Not on file  Stress: Not on file  Social Connections: Not on file  Intimate Partner Violence: Not on file   Family History  Problem Relation Age of Onset  . Diabetes Brother   . Hypertension Mother   . Colon cancer Neg Hx   . Esophageal cancer Neg Hx   . Pancreatic cancer Neg Hx   . Stomach cancer Neg Hx   . Liver disease Neg Hx   . Hemochromatosis Neg Hx   . Rectal cancer Neg Hx    I have reviewed his medical, social, and family history in detail and updated the electronic medical record as necessary.    PHYSICAL EXAMINATION  BP (!)  152/80   Pulse 88   Ht 5' 11"  (1.803 m)   Wt 181 lb (82.1 kg)   BMI 25.24 kg/m  Wt Readings from Last 3 Encounters:  03/13/21 181 lb (82.1 kg)  03/13/21 181 lb 6.4 oz (82.3 kg)  02/24/21 173 lb 9.6 oz (78.7 kg)  GEN: NAD, appears stated age, doesn't appear chronically ill PSYCH: Cooperative, without pressured speech EYE: Conjunctivae pink, sclerae anicteric ENT: Masked CV: Nontachycardic RESP: No audible wheezing GI: NABS, soft, NT/ND, without rebound or guarding, no HSM appreciated MSK/EXT: No lower extremity edema SKIN: No jaundice NEURO:  Alert & Oriented x 3, no focal deficits   REVIEW OF DATA  I reviewed the following data at the time of this encounter:  GI Procedures and Studies  February 2022 EGD - No gross lesions in esophagus. Z-line regular, 41 cm from the incisors. - 1 cm hiatal hernia. - Gastritis. Non-bleeding gastric ulcers with a clean ulcer base ([place] Class III). No other gross lesions in the stomach. Biopsied. - Erythematous duodenopathy in bulb. No other gross lesions in the duodenal bulb, in the first portion of the duodenum and in the  second portion of the duodenum. Biopsied.  February 2022 colonoscopy - Hemorrhoids found on digital rectal exam. - The examined portion of the ileum was normal - no bleeding noted coming from above. - One 8 mm polyp at the hepatic flexure. Resection not attempted. - Diverticulosis in the recto-sigmoid colon, in the sigmoid colon, in the descending colon, at the hepatic flexure and in the ascending colon. - Blood in the entire examined colon - lavaged and suctioned with adequate visualization. - Non-bleeding non-thrombosed external and internal hemorrhoids. - I evaluated the colon on 4 separate pull-throughs from TI to rectum in effort of localizing any active bleeding, and nothing was found.  February 2022 video capsule endoscopy Capsule shows red blood at the very distal ileum entering cecum where red blood is noted.  There was no blood in the more proximal small bowel and suspect this is refluxed into the small bowel from cecum given Dr. Donneta Romberg colonoscopy result.    Laboratory Studies  Reviewed those in epic  Imaging Studies  February 2022 CT angio IMPRESSION: 1. No evidence of active GI bleed at this time. 2. Mild colonic diverticulosis without evidence of diverticulitis.  February 2022 tagged RBC IMPRESSION: No extraluminal radiotracer activity to suggest the presence of active gastrointestinal bleed.   ASSESSMENT  Mr. Landress is a 65 y.o. male with a pmh significant for hypertension, hyperlipidemia (diet-controlled), diverticulosis (recent hospitalization for presumed lower GI bleeding), colon polyps, H. pylori infection (awaiting eradication), PUD (manifested as GU).  The patient is seen today for evaluation and management of:  1. Diverticulosis of large intestine with hemorrhage   2. History of lower GI bleeding   3. Hx of colonic polyps   4. History of gastric ulcer   5. History of Helicobacter infection    The patient is clinically and hemodynamically stable at this time.  No evidence of recurrent bleeding.  Etiology felt to likely be stuttering diverticular hemorrhage.  Meckel's still needs to be kept in mind in the future should patient have recurrent issues only because it was never fundamentally ruled out with a Meckel scan.  Hopefully he never has another episode of presumed diverticular bleeding.  Did discuss role of high-fiber diet and fiber supplementation to try and minimize recurrent issues from a diverticulosis perspective.  Did describe the risk of recurrence is higher in patients who have had diverticular bleeding in the past for recurrent episode in the future but hopefully this will not occur.  Colonoscopy for removal of the polyp that was found is indicated as well as to ensure no other polyps were missed at the time of his diagnostic procedure in the hospital-based setting.   EGD for follow-up of GU healing and H. pylori eradication is important.  H. pylori stool antigen will be obtained.  The risks and benefits of endoscopic evaluation were discussed with the patient; these include but are not limited to the risk of perforation, infection, bleeding, missed lesions, lack of diagnosis, severe illness requiring hospitalization, as well as anesthesia and sedation related illnesses.  The patient is agreeable to proceed.  All patient questions were answered to the best of my ability, and the patient agrees to the aforementioned plan of action with follow-up as indicated.   PLAN  Proceed with obtaining H. pylori stool antigen Surveillance EGD for follow-up of GU is recommended and will be performed at time of colonoscopy for colon polyp removal (this can be performed within the next 6 months)   Orders Placed This Encounter  Procedures  . Helicobacter pylori special antigen    New Prescriptions   No medications on file   Modified Medications   No medications on file    Planned Follow Up Return in about 6 months (around 09/13/2021).   Total Time in Face-to-Face and in Coordination of Care for patient including independent/personal interpretation/review of prior testing, medical history, examination, medication adjustment, communicating results with the patient directly, and documentation with the EHR is 25 minutes.   Justice Britain, MD [facility]oscopy Office # [phone]

## 2021-03-20 ENCOUNTER — Encounter: Payer: Self-pay | Admitting: Gastroenterology

## 2021-03-20 DIAGNOSIS — Z860100 Personal history of colon polyps, unspecified: Secondary | ICD-10-CM | POA: Insufficient documentation

## 2021-03-20 DIAGNOSIS — Z8719 Personal history of other diseases of the digestive system: Secondary | ICD-10-CM | POA: Insufficient documentation

## 2021-03-20 DIAGNOSIS — Z8711 Personal history of peptic ulcer disease: Secondary | ICD-10-CM | POA: Insufficient documentation

## 2021-03-20 DIAGNOSIS — Z8619 Personal history of other infectious and parasitic diseases: Secondary | ICD-10-CM | POA: Insufficient documentation

## 2021-03-23 NOTE — Progress Notes (Deleted)
Visit with patient was cancelled as Dr. Rush Landmark was able to see the patient for his scheduled appointment.

## 2021-03-26 NOTE — Progress Notes (Deleted)
Patient was seen by Dr. Rush Landmark, visit with me was canceled.

## 2021-03-31 ENCOUNTER — Ambulatory Visit: Payer: Federal, State, Local not specified - PPO | Admitting: Gastroenterology

## 2021-03-31 DIAGNOSIS — M19072 Primary osteoarthritis, left ankle and foot: Secondary | ICD-10-CM | POA: Diagnosis not present

## 2021-04-01 ENCOUNTER — Ambulatory Visit: Payer: Federal, State, Local not specified - PPO | Admitting: Gastroenterology

## 2021-04-02 ENCOUNTER — Ambulatory Visit (INDEPENDENT_AMBULATORY_CARE_PROVIDER_SITE_OTHER): Payer: Medicare Other | Admitting: Family Medicine

## 2021-04-02 ENCOUNTER — Other Ambulatory Visit: Payer: Self-pay

## 2021-04-02 ENCOUNTER — Other Ambulatory Visit: Payer: Self-pay | Admitting: Family Medicine

## 2021-04-02 ENCOUNTER — Encounter: Payer: Self-pay | Admitting: Family Medicine

## 2021-04-02 VITALS — BP 128/80 | HR 79 | Temp 98.0°F | Wt 179.8 lb

## 2021-04-02 DIAGNOSIS — I10 Essential (primary) hypertension: Secondary | ICD-10-CM | POA: Diagnosis not present

## 2021-04-02 DIAGNOSIS — Z8619 Personal history of other infectious and parasitic diseases: Secondary | ICD-10-CM | POA: Diagnosis not present

## 2021-04-02 DIAGNOSIS — Z8719 Personal history of other diseases of the digestive system: Secondary | ICD-10-CM | POA: Diagnosis not present

## 2021-04-02 DIAGNOSIS — L739 Follicular disorder, unspecified: Secondary | ICD-10-CM | POA: Diagnosis not present

## 2021-04-02 DIAGNOSIS — Z8711 Personal history of peptic ulcer disease: Secondary | ICD-10-CM

## 2021-04-02 MED ORDER — TRIAMCINOLONE ACETONIDE 0.1 % EX CREA
TOPICAL_CREAM | Freq: Two times a day (BID) | CUTANEOUS | 2 refills | Status: DC
Start: 2021-04-02 — End: 2021-10-20

## 2021-04-02 NOTE — Progress Notes (Signed)
Subjective:    Patient ID: Jay Robertson, male    DOB: Oct 19, 1956, 65 y.o.   MRN: [id number]  No chief complaint on file.   HPI Patient was seen today for f/u.  Pt seen by GI on 3/17 for diverticulosis, gastric ulcers, h/o H. Pylori and history of lower GI bleeding.  H/ Pylori stool ag pending.  Pt to f/u with GI in 6 months for EGD and colonoscopy for polyp removal.  Pt states he is feeling closer to normal.  Pt thinks his bp cuff at home is not working correctly as the readings are higher, 148/95, 138/92.  Pt denies HAs, changes in vision, CP, dizziness.  Pt requesting refill on triamcinolone cream for razor bumps in posterior scalp/neck.  Pt seen by Ortho earlier this wk for L knee pain from fall during hospitalzation and chronic L foot pain.  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   Pt initially made appt to recheck labs, but states he really does not want to "get stuck".  Notes some improvement in LUE at area of thrombophlebitis.  Area still hard.  Past Medical History:  Diagnosis Date  . ALLERGIC RHINITIS 10/24/2007  . GI bleed   . HYPERLIPIDEMIA 10/16/2010  . HYPERTENSION 07/25/2007    Allergies  Allergen Reactions  . Doxycycline Other (See Comments)    Headaches    ROS General: Denies fever, chills, night sweats, changes in weight, changes in appetite HEENT: Denies headaches, ear pain, changes in vision, rhinorrhea, sore throat CV: Denies CP, palpitations, SOB, orthopnea Pulm: Denies SOB, cough, wheezing GI: Denies abdominal pain, nausea, vomiting, diarrhea, constipation GU: Denies dysuria, hematuria, frequency Msk: Denies muscle cramps, joint pains  +L foot and knee pain. Neuro: Denies weakness, numbness, tingling Skin: Denies rashes, bruising +LUE thrombophlebitis Psych: Denies depression, anxiety, hallucinations     Objective:    Blood pressure 128/80, pulse 79, temperature 98 F (36.7 C), temperature source Oral, weight 179 lb 12.8 oz (81.6 kg), SpO2 98 %.  Gen. Pleasant,  well-nourished, in no distress, normal affect   HEENT: [place]/AT, face symmetric, conjunctiva clear, no scleral icterus, PERRLA, EOMI, nares patent without drainage Lungs: no accessory muscle use Cardiovascular: RRR, no peripheral edema Musculoskeletal: No deformities, no cyanosis or clubbing, normal tone Neuro:  A&Ox3, CN II-XII intact, normal gait Skin:  Warm, no lesions/ rash   Wt Readings from Last 3 Encounters:  03/13/21 181 lb (82.1 kg)  03/13/21 181 lb 6.4 oz (82.3 kg)  02/24/21 173 lb 9.6 oz (78.7 kg)    Lab Results  Component Value Date   WBC 4.6 02/14/2021   HGB 9.3 (L) 02/14/2021   HCT 28.1 (L) 02/14/2021   PLT 468.0 (H) 02/14/2021   GLUCOSE 102 (H) 02/14/2021   CHOL 220 (H) 10/09/2010   TRIG 62.0 10/09/2010   HDL 42.50 10/09/2010   LDLDIRECT 163.0 10/09/2010   ALT 28 01/29/2021   AST 22 01/29/2021   NA 139 02/14/2021   K 4.1 02/14/2021   CL 106 02/14/2021   CREATININE 1.03 02/14/2021   BUN 9 02/14/2021   CO2 26 02/14/2021   TSH 0.89 10/09/2010   PSA 1.47 10/09/2010   INR 1.3 (H) 02/04/2021    Assessment/Plan:  Essential hypertension -controlled -pt encouraged to bring home bp cuff to next appt to compare readings -continue 6 Spironolactone 25 mg daily and benazepril 20 mg daily -Continue lifestyle modifications  Folliculitis  - Plan: triamcinolone (KENALOG) 0.1 %  History of lower GI bleeding -Stable -Patient encouraged to  monitor for s/s of rebleeding -continue OTC ferrous sulfate 325 mg daily -Continue follow-up with GI -Colonoscopy planned in 6 months  History of Helicobacter infection -Stool antigen pending  History of gastric ulcer -Avoid NSAIDs -EGD planned in 6 months -Continue follow-up with gastroenterology  F/u prn  Grier Mitts, MD

## 2021-04-12 DIAGNOSIS — R590 Localized enlarged lymph nodes: Secondary | ICD-10-CM | POA: Diagnosis not present

## 2021-04-14 ENCOUNTER — Telehealth: Payer: Self-pay | Admitting: Family Medicine

## 2021-04-14 NOTE — Telephone Encounter (Signed)
Patient called after hours on 04/15. The patient was seen for a swollen lymph node on 10/17/2020 and he wants to know if he can get the same antibiotic again? His left arm is swelling in his armpit.

## 2021-04-16 NOTE — Telephone Encounter (Signed)
Patient went to urgent care and they told him that the lymph node will go down and did not prescribe him any antibiotics. They told him to follow up with his doctor if it doesn't go down.

## 2021-04-22 ENCOUNTER — Other Ambulatory Visit: Payer: Self-pay

## 2021-04-23 ENCOUNTER — Ambulatory Visit (INDEPENDENT_AMBULATORY_CARE_PROVIDER_SITE_OTHER): Payer: Medicare Other | Admitting: Family Medicine

## 2021-04-23 ENCOUNTER — Encounter: Payer: Self-pay | Admitting: Family Medicine

## 2021-04-23 VITALS — BP 136/82 | HR 74 | Temp 97.7°F | Wt 182.4 lb

## 2021-04-23 DIAGNOSIS — L03012 Cellulitis of left finger: Secondary | ICD-10-CM

## 2021-04-23 DIAGNOSIS — M79645 Pain in left finger(s): Secondary | ICD-10-CM | POA: Diagnosis not present

## 2021-04-23 DIAGNOSIS — S61432A Puncture wound without foreign body of left hand, initial encounter: Secondary | ICD-10-CM | POA: Diagnosis not present

## 2021-04-23 MED ORDER — SULFAMETHOXAZOLE-TRIMETHOPRIM 800-160 MG PO TABS: 1.0000 | ORAL_TABLET | Freq: Two times a day (BID) | ORAL | 0 refills | Status: AC

## 2021-04-23 NOTE — Progress Notes (Signed)
Subjective:    Patient ID: Jay Robertson, male    DOB: 11-15-1956, 65 y.o.   MRN: [id number]  No chief complaint on file.   HPI Patient was seen today for acute concern.  Patient endorses puncturing his left palm with a nail when ripping up tiling as it had black mold underneath.  Patient notes end of left fourth finger became painful and swollen after the incident.  It would resolve and return.  Patient denies inability to bend finger.  Tdap 11/29/2013.  Past Medical History:  Diagnosis Date  . ALLERGIC RHINITIS 10/24/2007  . GI bleed   . HYPERLIPIDEMIA 10/16/2010  . HYPERTENSION 07/25/2007    Allergies  Allergen Reactions  . Doxycycline Other (See Comments)    Headaches   Family History  Problem Relation Age of Onset  . Diabetes Brother   . Hypertension Mother   . Colon cancer Neg Hx   . Esophageal cancer Neg Hx   . Pancreatic cancer Neg Hx   . Stomach cancer Neg Hx   . Liver disease Neg Hx   . Hemochromatosis Neg Hx   . Rectal cancer Neg Hx    ROS General: Denies fever, chills, night sweats, changes in weight, changes in appetite HEENT: Denies headaches, ear pain, changes in vision, rhinorrhea, sore throat CV: Denies CP, palpitations, SOB, orthopnea Pulm: Denies SOB, cough, wheezing GI: Denies abdominal pain, nausea, vomiting, diarrhea, constipation GU: Denies dysuria, hematuria, frequency Msk: Denies muscle cramps, joint pains    Neuro: Denies weakness, numbness, tingling Skin: Denies rashes, bruising  +puncture wound of palm, L.  Finger edema and pain Psych: Denies depression, anxiety, hallucinations    Objective:    Blood pressure 136/82, pulse 74, temperature 97.7 F (36.5 C), temperature source Oral, weight 182 lb 6.4 oz (82.7 kg), SpO2 97 %.  Gen. Pleasant, well-nourished, in no distress, normal affect  HEENT: [place]/AT, face symmetric, conjunctiva clear, no scleral icterus, PERRLA, EOMI, nares patent without drainage Lungs: no accessory muscle use, CTAB, no  wheezes or rales Cardiovascular: RRR, no m/r/g, no peripheral edema Musculoskeletal: No deformities, no cyanosis or clubbing, normal tone Neuro:  A&Ox3, CN II-XII intact, normal gait Skin:  Warm, no lesions/ rash.  Puncture wound to L palm without erythema .  Mild edema of L 4th digit with TTP of distal phalanx. Subtle separation of L 4th digit cuticle from nail, TTP, no purulent drainage expressed.        Wt Readings from Last 3 Encounters:  04/23/21 182 lb 6.4 oz (82.7 kg)  04/02/21 179 lb 12.8 oz (81.6 kg)  03/13/21 181 lb (82.1 kg)    Lab Results  Component Value Date   WBC 4.6 02/14/2021   HGB 9.3 (L) 02/14/2021   HCT 28.1 (L) 02/14/2021   PLT 468.0 (H) 02/14/2021   GLUCOSE 102 (H) 02/14/2021   CHOL 220 (H) 10/09/2010   TRIG 62.0 10/09/2010   HDL 42.50 10/09/2010   LDLDIRECT 163.0 10/09/2010   ALT 28 01/29/2021   AST 22 01/29/2021   NA 139 02/14/2021   K 4.1 02/14/2021   CL 106 02/14/2021   CREATININE 1.03 02/14/2021   BUN 9 02/14/2021   CO2 26 02/14/2021   TSH 0.89 10/09/2010   PSA 1.47 10/09/2010   INR 1.3 (H) 02/04/2021    Assessment/Plan:  Paronychia of left ring finger -discussed supportive care including tylenol 2/2 h/o recent GIB, soaking in epsom salt/warm water -given precautions -allergies reviewed--allergy to doxycycline.  Will start Bactrim DS.    -  Plan: sulfamethoxazole-trimethoprim (BACTRIM DS) 800-160 MG tablet  Puncture wound of left hand without foreign body, initial encounter -per chart review Tdap up to date, given 11/29/2013 -continue to monitor  Pain of finger of left hand -likely 2/2 paronychia -care as discussed above -given precautions  F/u prn  Grier Mitts, MD

## 2021-04-23 NOTE — Patient Instructions (Addendum)
Paronychia Paronychia is an infection of the skin that surrounds a nail. It usually affects the skin around a fingernail, but it may also occur near a toenail. It often causes pain and swelling around the nail. In some cases, a collection of pus (abscess) can form near or under the nail.  This condition may develop suddenly, or it may develop gradually over a longer period. In most cases, paronychia is not serious, and it will clear up with treatment. What are the causes? This condition may be caused by bacteria or a fungus. These germs can enter the body through an opening in the skin, such as a cut or a hangnail. What increases the risk? This condition is more likely to develop in people who:  Get their hands wet often, such as those who work as Designer, industrial/product, bartenders, or nurses.  Bite their fingernails or suck their thumbs.  Trim their nails very short.  Have hangnails or injured fingertips.  Get manicures.  Have diabetes. What are the signs or symptoms? Symptoms of this condition include:  Redness and swelling of the skin near the nail.  Tenderness around the nail when you touch the area.  Pus-filled bumps under the skin at the base and sides of the nail (cuticle).  Fluid or pus under the nail.  Throbbing pain in the area. How is this diagnosed? This condition is diagnosed with a physical exam. In some cases, a sample of pus may be tested to determine what type of bacteria or fungus is causing the condition. How is this treated? Treatment depends on the cause and severity of your condition. If your condition is mild, it may clear up on its own in a few days or after soaking in warm water. If needed, treatment may include:  Antibiotic medicine, if your infection is caused by bacteria.  Antifungal medicine, if your infection is caused by a fungus.  A procedure to drain pus from an abscess.  Anti-inflammatory medicine (corticosteroids). Follow these instructions at  home: Wound care  Keep the affected area clean.  Soak the affected area in warm water, if told to do so by your health care provider. You may be told to do this for 20 minutes, 2-3 times a day.  Keep the area dry when you are not soaking it.  Do not try to drain an abscess yourself.  Follow instructions from your health care provider about how to take care of the affected area. Make sure you: ? Wash your hands with soap and water before you change your bandage (dressing). If soap and water are not available, use hand sanitizer. ? Change your dressing as told by your health care provider.  If you had an abscess drained, check the area every day for signs of infection. Check for: ? Redness, swelling, or pain. ? Fluid or blood. ? Warmth. ? Pus or a bad smell. Medicines  Take over-the-counter and prescription medicines only as told by your health care provider.  If you were prescribed an antibiotic medicine, take it as told by your health care provider. Do not stop taking the antibiotic even if you start to feel better.   General instructions  Avoid contact with harsh chemicals.  Do not pick at the affected area. Prevention  To prevent this condition from happening again: ? Wear rubber gloves when washing dishes or doing other tasks that require your hands to get wet. ? Wear gloves if your hands might come in contact with cleaners or other chemicals. ?  Avoid injuring your nails or fingertips. ? Do not bite your nails or tear hangnails. ? Do not cut your nails very short. ? Do not cut your cuticles. ? Use clean nail clippers or scissors when trimming nails. Contact a health care provider if:  Your symptoms get worse or do not improve with treatment.  You have continued or increased fluid, blood, or pus coming from the affected area.  Your finger or knuckle becomes swollen or difficult to move. Get help right away if you have:  A fever or chills.  Redness spreading away  from the affected area.  Joint or muscle pain. Summary  Paronychia is an infection of the skin that surrounds a nail. It often causes pain and swelling around the nail. In some cases, a collection of pus (abscess) can form near or under the nail.  This condition may be caused by bacteria or a fungus. These germs can enter the body through an opening in the skin, such as a cut or a hangnail.  If your condition is mild, it may clear up on its own in a few days. If needed, treatment may include medicine or a procedure to drain pus from an abscess.  To prevent this condition from happening again, wear gloves if doing tasks that require your hands to get wet or to come in contact with chemicals. Also avoid injuring your nails or fingertips. This information is not intended to replace advice given to you by your health care provider. Make sure you discuss any questions you have with your health care provider. Document Revised: 10/09/2020 Document Reviewed: 10/09/2020 Elsevier Patient Education  2021 [employer].  Puncture Wound A puncture wound is an injury that is caused by a sharp, thin object that goes through (penetrates) your skin. Usually, a puncture wound does not leave a large opening in your skin, so it may not bleed a lot. However, when you get a puncture wound, dirt or other materials (foreign bodies) can be forced into your wound and can break off inside. This increases the chance of infection, such as tetanus. There are many sharp, pointed objects that can cause puncture wounds, including teeth, nails, splinters of glass, fishhooks, and needles. Treatment may include washing out the wound with a germ-free (sterile) salt-water solution, having the wound opened surgically to remove a foreign object, closing the wound with stitches (sutures), and covering the wound with antibiotic ointment and a bandage (dressing). Depending on what caused the injury, you may also need a tetanus shot or a  rabies shot. Follow these instructions at home: Medicines  Take or apply over-the-counter and prescription medicines only as told by your health care provider.  If you were prescribed an antibiotic medicine, take or apply it as told by your health care provider. Do not stop using the antibiotic even if your condition improves. Bathing  Keep the dressing dry as told by your health care provider.  Do not take baths, swim, or use a hot tub until your health care provider approves. Ask your health care provider if you may take showers. You may only be allowed to take sponge baths. Wound care  There are many ways to close and cover a wound. For example, a wound can be closed with sutures, skin glue, or adhesive strips. Follow instructions from your health care provider about how to take care of your wound. Make sure you: ? Wash your hands with soap and water before and after you change your dressing. If soap  and water are not available, use hand sanitizer. ? Change your dressing as told by your health care provider. ? Leave sutures, skin glue, or adhesive strips in place. These skin closures may need to stay in place for 2 weeks or longer. If adhesive strip edges start to loosen and curl up, you may trim the loose edges. Do not remove adhesive strips completely unless your health care provider tells you to do that.  Clean the wound as told by your health care provider.  Do not scratch or pick at the wound.  Check your wound every day for signs of infection. Check for: ? Redness, swelling, or pain. ? Fluid or blood. ? Warmth. ? Pus or a bad smell.   General instructions  Raise (elevate) the injured area above the level of your heart while you are sitting or lying down.  If your puncture wound is in your foot, ask your health care provider if you need to avoid putting weight on your foot and for how long. Do not use the injured limb to support your body weight until your health care provider  says that you can. Use crutches as told by your health care provider.  Keep all follow-up visits as told by your health care provider. This is important. Contact a health care provider if:  You received a tetanus shot and you have swelling, severe pain, redness, or bleeding at the injection site.  You have a fever.  Your sutures come out.  You notice a bad smell coming from your wound or your dressing.  You notice something coming out of your wound, such as wood or glass.  Your pain is not controlled with medicine.  You have increased redness, swelling, or pain at the site of your wound.  You have fluid, blood, or pus coming from your wound.  You notice a change in the color of your skin near your wound.  You need to change the dressing frequently due to fluid, blood, or pus draining from your wound.  You develop a new rash.  You develop numbness around your wound.  You have warmth around your wound. Get help right away if:  You develop severe swelling around your wound.  Your pain suddenly increases and is severe.  You develop painful skin lumps.  You have a red streak going away from your wound.  The wound is on your hand or foot and you: ? Cannot properly move a finger or toe. ? Notice that your fingers or toes look pale or bluish. Summary  A puncture wound is an injury that is caused by a sharp, thin object that goes through (penetrates) your skin.  Treatment may include washing out the wound, having the wound opened surgically to remove a foreign object, closing the wound with stitches (sutures), and covering the wound with antibiotic ointment and a bandage (dressing).  Follow instructions from your health care provider about how to take care of your wound.  Contact a health care provider if you have increased redness, swelling, or pain at the site of your wound.  Keep all follow-up visits as told by your health care provider. This is important. This  information is not intended to replace advice given to you by your health care provider. Make sure you discuss any questions you have with your health care provider. Document Revised: 04/23/2020 Document Reviewed: 07/21/2018 Elsevier Patient Education  2021 [employer].

## 2021-07-31 IMAGING — CT CT CTA ABD/PEL W/CM AND/OR W/O CM
3 of 13 series · 11 of 46 positions shown, 17 images · IV contrast (APPLIED)
Comparison: None.

CLINICAL DATA: 65-year-old male with GI bleed.

EXAM:
CTA ABDOMEN AND PELVIS WITHOUT AND WITH CONTRAST
TECHNIQUE: Multidetector CT imaging of the abdomen and pelvis was performed
using the standard protocol during bolus administration of
intravenous contrast. Multiplanar reconstructed images and MIPs were
obtained and reviewed to evaluate the vascular anatomy.
CONTRAST:  100mL OMNIPAQUE IOHEXOL 350 MG/ML SOLN

[Series 10: arterial 3.0 i40f 2 · axial · arterial · 0.91mm/px · z∈[+982,+1222]mm · 5 of 162 slices shown]
[im 14/162  soft-tissue]
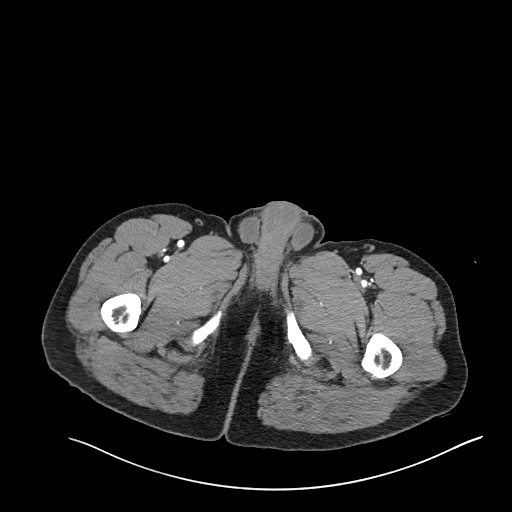
[im 41/162  soft-tissue]
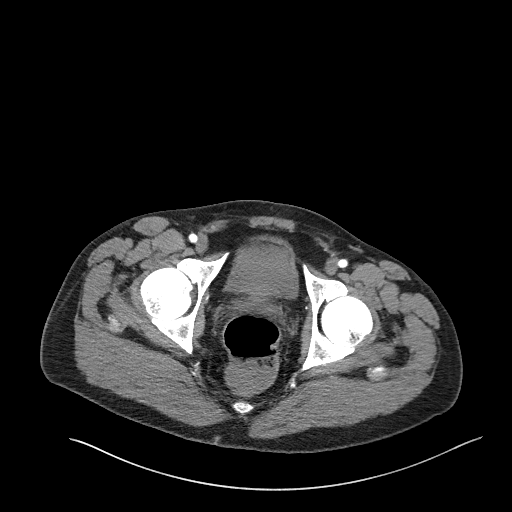
[im 54/162  soft-tissue]
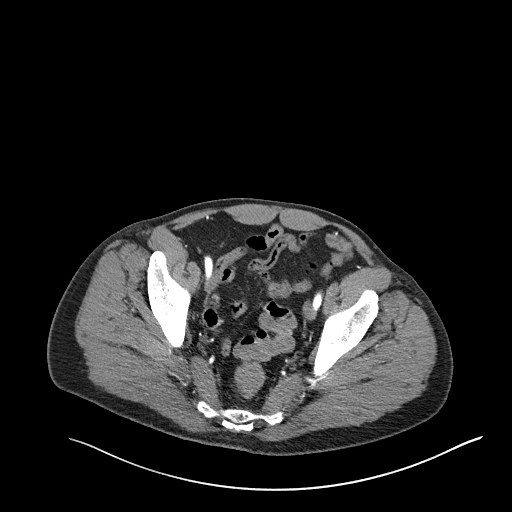
[im 68/162  soft-tissue]
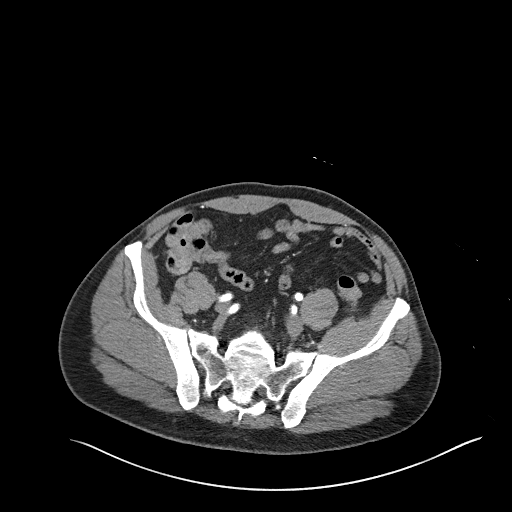
[im 94/162  soft-tissue]
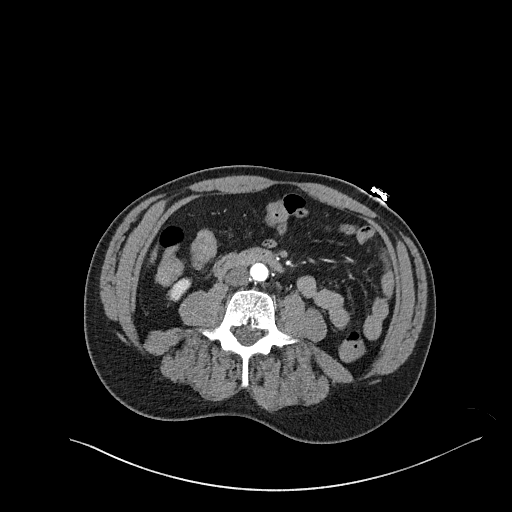

[Series 12: coronals · coronal · 0.80mm/px · 1 of 151 slices shown, 2 images]
[im 76/151  soft-tissue]
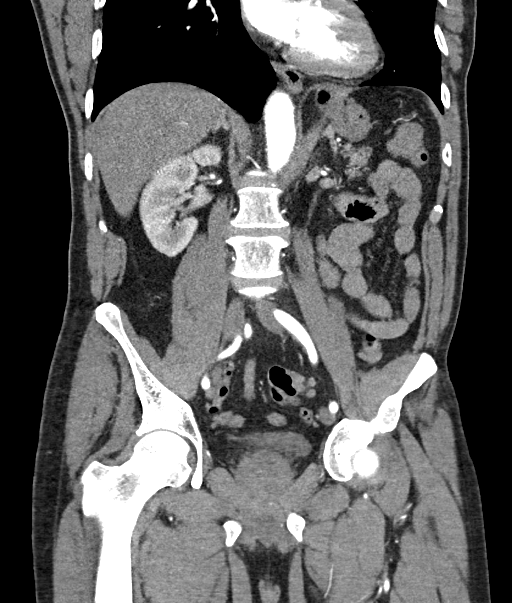
[im 76/151  bone]
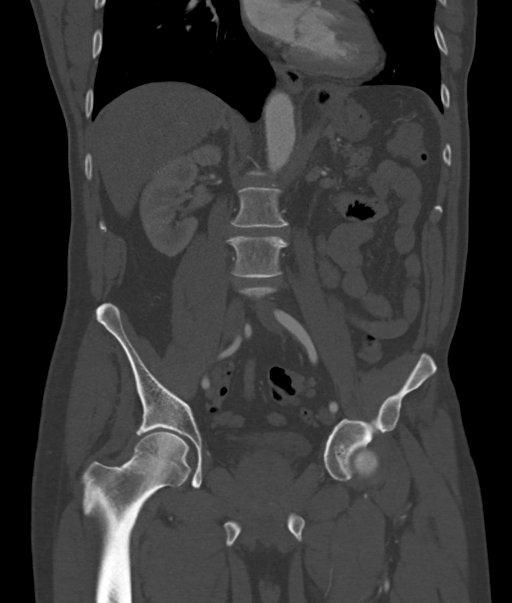

[Series 16: venous 5.0 i30f 1 · axial · portal-venous · 0.91mm/px · z∈[+1035,+1340]mm · 5 of 93 slices shown, 10 images]
[im 16/93  soft-tissue]
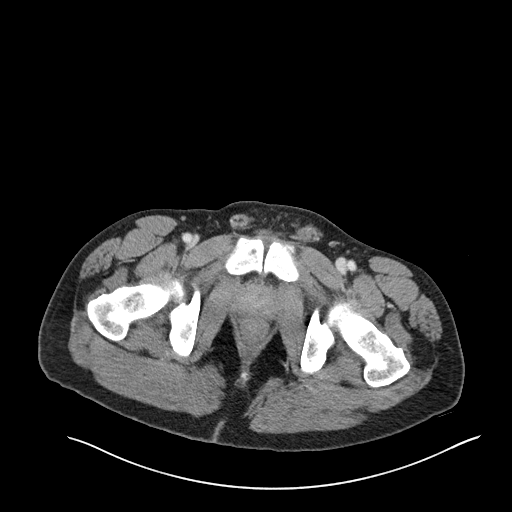
[im 16/93  bone]
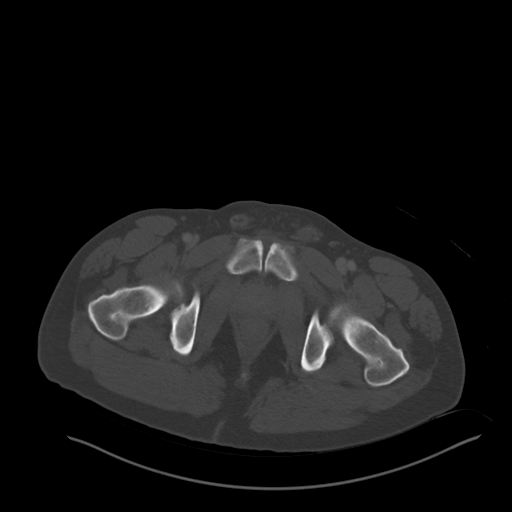
[im 31/93  soft-tissue]
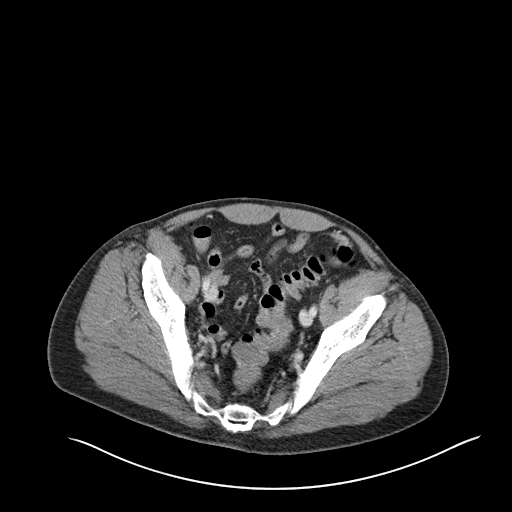
[im 31/93  lung]
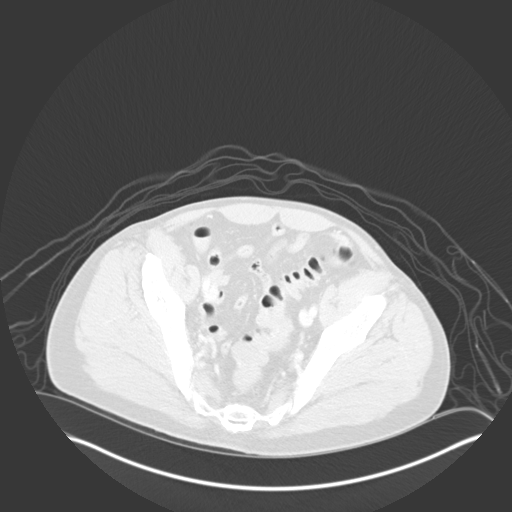
[im 47/93  soft-tissue]
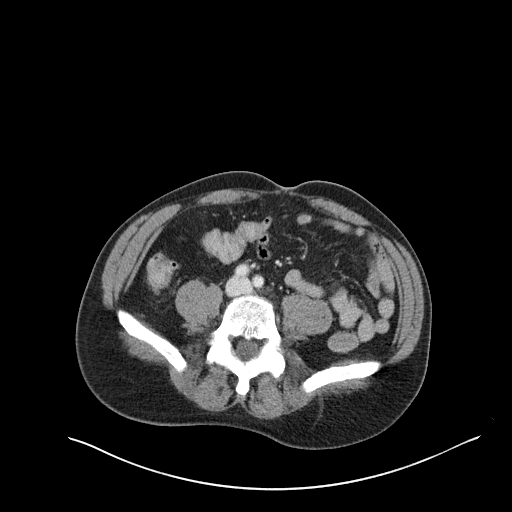
[im 47/93  lung]
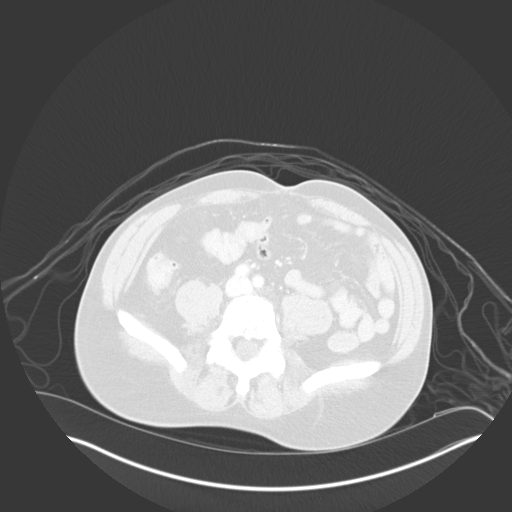
[im 62/93  soft-tissue]
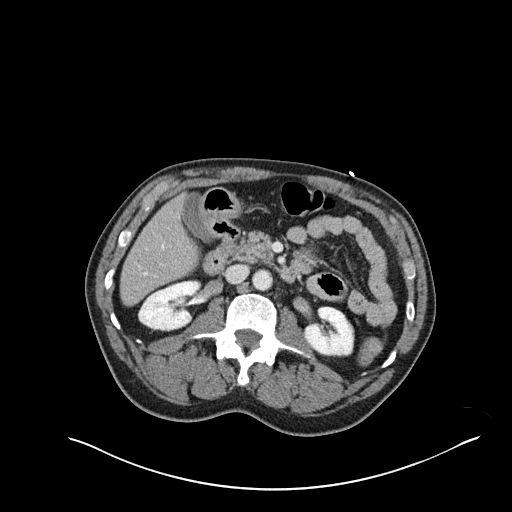
[im 62/93  lung]
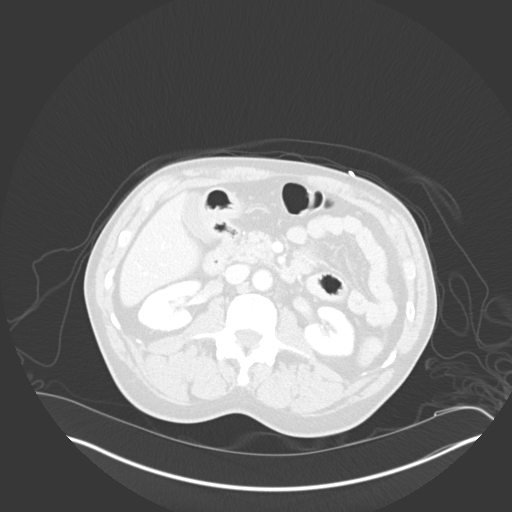
[im 77/93  soft-tissue]
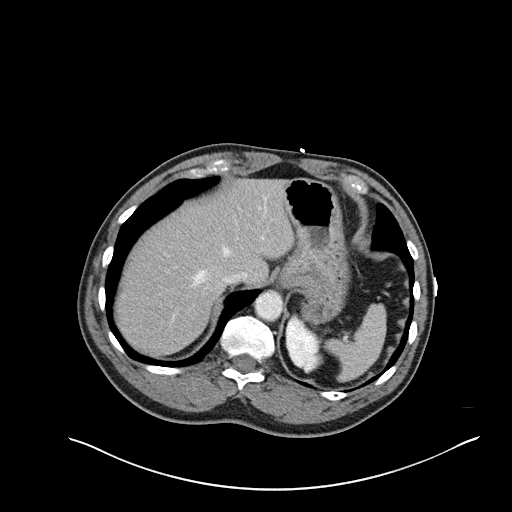
[im 77/93  lung]
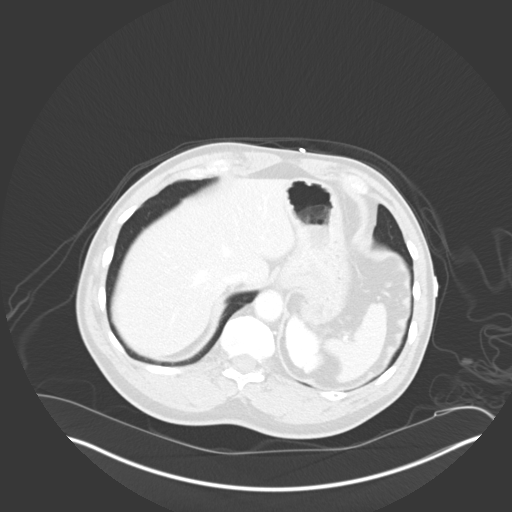

[11 of 46 positions shown; findings below may reference images not displayed]

FINDINGS: VASCULAR

Aorta: Mild atherosclerotic calcification. No aneurysmal dilatation
or dissection. No periaortic fluid collection.

Celiac: The celiac axis and its major branches are patent. There is
a replaced left hepatic artery from left gastric artery.

SMA: Patent without evidence of aneurysm, dissection, vasculitis or
significant stenosis.

Renals: Both renal arteries are patent without evidence of aneurysm,
dissection, vasculitis, fibromuscular dysplasia or significant
stenosis.

IMA: Patent without evidence of aneurysm, dissection, vasculitis or
significant stenosis.

Inflow: Mild atherosclerotic calcification. No aneurysmal dilatation
or dissection. The iliac arteries are patent.

Proximal Outflow: Bilateral common femoral and visualized portions
of the superficial and profunda femoral arteries are patent without
evidence of aneurysm, dissection, vasculitis or significant
stenosis.

Veins: The IVC is unremarkable. The SMV, splenic vein, and main
portal vein are patent. No portal venous gas.

Review of the MIP images confirms the above findings.

NON-VASCULAR

Lower chest: The visualized lung bases are clear.

No intra-abdominal free air or free fluid.

Hepatobiliary: No focal liver abnormality is seen. No gallstones,
gallbladder wall thickening, or biliary dilatation.

Pancreas: Unremarkable. No pancreatic ductal dilatation or
surrounding inflammatory changes.

Spleen: Normal in size without focal abnormality.

Adrenals/Urinary Tract: The adrenal glands unremarkable. High
density content within the renal collecting system on precontrast
study may represent residual contrast from prior administration.
However, a 3 mm nonobstructing left renal inferior pole calculus may
be present. There is no hydronephrosis on either side. There is
symmetric enhancement of the kidneys. A 1 cm left renal interpolar
cyst. The visualized ureters appear unremarkable. The urinary
bladder is collapsed.

Stomach/Bowel: There is loose stool throughout the colon compatible
with diarrheal state. There is sigmoid diverticulosis and scattered
colonic diverticula without active inflammatory changes. There is
segmental thickening of the proximal transverse colon which may be
related to underdistention. There is however apparent soft tissue
nodularity at the distal portion of the thickened segment (coronal
38/19) and therefore a mass is not excluded. Further evaluation with
colonoscopy after resolution of acute symptoms recommended. There is
overall diffuse increase in the attenuation of the colonic content
post contrast which may be related to hyperemia suggestive of
possible mild colitis. No obvious focal contrast extravasation
identified. There is no bowel obstruction. Appendectomy.

Lymphatic: No adenopathy.

Reproductive: The prostate and seminal vesicles are grossly
unremarkable.

Other: None

Musculoskeletal: Degenerative changes with disc desiccation at
L5-S1. No acute osseous pathology.
IMPRESSION: 1. No definite CT evidence of active GI bleed.
2. Diarrheal state with possible mild colitis. No bowel obstruction.
3. Colonic diverticulosis.
4. Segmental thickening of the proximal transverse colon may be
related to underdistention. A colonic mass is not excluded. Further
evaluation with colonoscopy after resolution of acute symptoms
recommended.

## 2021-08-02 ENCOUNTER — Encounter (HOSPITAL_BASED_OUTPATIENT_CLINIC_OR_DEPARTMENT_OTHER): Payer: Self-pay | Admitting: *Deleted

## 2021-08-02 ENCOUNTER — Emergency Department (HOSPITAL_BASED_OUTPATIENT_CLINIC_OR_DEPARTMENT_OTHER)
Admission: EM | Admit: 2021-08-02 | Discharge: 2021-08-02 | Disposition: A | Discharge status: Home / Self Care | Payer: Medicare Other | Attending: Emergency Medicine | Admitting: Emergency Medicine

## 2021-08-02 ENCOUNTER — Other Ambulatory Visit: Payer: Self-pay

## 2021-08-02 DIAGNOSIS — R55 Syncope and collapse: Secondary | ICD-10-CM | POA: Diagnosis not present

## 2021-08-02 DIAGNOSIS — Z5321 Procedure and treatment not carried out due to patient leaving prior to being seen by health care provider: Secondary | ICD-10-CM | POA: Insufficient documentation

## 2021-08-02 LAB — CBG MONITORING, ED: Glucose-Capillary: 103 mg/dL — ABNORMAL HIGH (ref 70–99)

## 2021-08-02 NOTE — ED Notes (Signed)
Patient notified registration that they are leaving. RN did not witness patient leaving. Unknown condition.

## 2021-08-02 NOTE — ED Triage Notes (Signed)
Pt reports syncopal episode while attending a funeral today. States that EMS responded. Reports that he had not eaten at all prior to the funeral.  Pt ambulatory, denies feeling bad now.  Denies N/V.  Pt has eaten since the episode.

## 2021-08-03 IMAGING — DX DG CHEST 1V
1 series · 1 of 1 positions shown · non-contrast
Comparison: February 12, 2019.

CLINICAL DATA: Central line placement.

EXAM:
CHEST  1 VIEW

[chest ap]
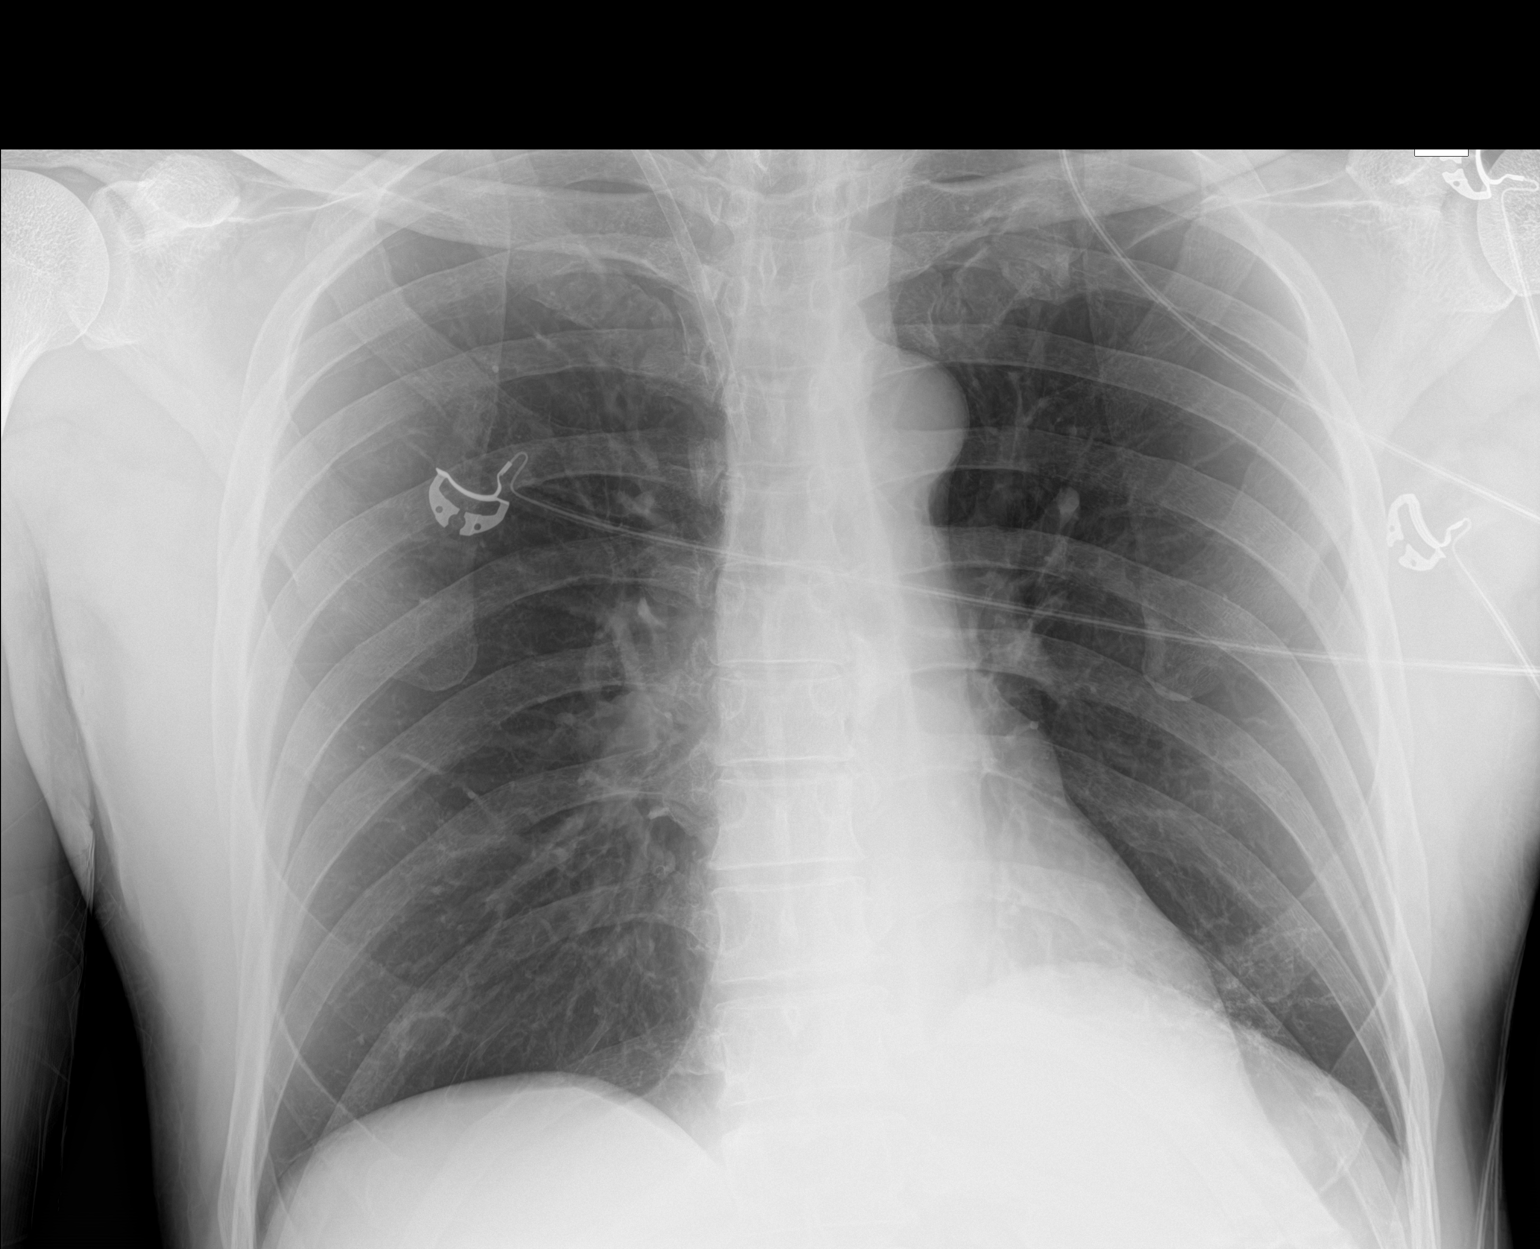

[1 of 1 positions shown; findings below may reference images not displayed]

FINDINGS: The heart size and mediastinal contours are within normal limits.
Both lungs are clear. No pneumothorax or pleural effusion is noted.
Venous sheath is seen in right internal jugular vein with distal tip
in expected position of the SVC. The visualized skeletal structures
are unremarkable.
IMPRESSION: Venous sheath is seen in right internal jugular vein with distal tip
in expected position of the SVC.

## 2021-08-06 ENCOUNTER — Other Ambulatory Visit: Payer: Self-pay

## 2021-08-07 ENCOUNTER — Encounter: Payer: Self-pay | Admitting: Family Medicine

## 2021-08-07 ENCOUNTER — Ambulatory Visit (INDEPENDENT_AMBULATORY_CARE_PROVIDER_SITE_OTHER): Payer: Medicare Other | Admitting: Family Medicine

## 2021-08-07 VITALS — BP 118/82 | HR 77 | Temp 98.1°F | Wt 180.6 lb

## 2021-08-07 DIAGNOSIS — R55 Syncope and collapse: Secondary | ICD-10-CM

## 2021-08-07 LAB — POCT GLYCOSYLATED HEMOGLOBIN (HGB A1C): Hemoglobin A1C: 5.6 % (ref 4.0–5.6)

## 2021-08-07 NOTE — Progress Notes (Signed)
Subjective:    Patient ID: Jay Robertson, male    DOB: July 23, 1956, 65 y.o.   MRN: [id number]  Chief Complaint  Patient presents with   Loss of Consciousness    Fainted Saturday at funeral, ambulance came and checked blood sugar, a1c, and heart.     HPI Patient was seen today for acute concern.  Patient endorses syncopal episode over the weekend while at a funeral.  Patient did not eat or drink anything that day.  Blood sugar and EKG normal per EMS.  Patient declined transport to hospital.  Patient then decided to go to the ED on 8/6.  EKG in ED normal and blood sugar 103.  Patient left ED document same.  Pt convinced he has diabetes on the advice of others present during the funeral.  Patient endorses going through 2.5 packs of baking in a week as he eats 8 strips every morning for breakfast.  Patient also has hamburger each day and a biscuit.  Patient states he has been doing this for years without any issues.  Past Medical History:  Diagnosis Date   ALLERGIC RHINITIS 10/24/2007   GI bleed    HYPERLIPIDEMIA 10/16/2010   HYPERTENSION 07/25/2007    Allergies  Allergen Reactions   Doxycycline Other (See Comments)    Headaches    ROS General: Denies fever, chills, night sweats, changes in weight, changes in appetite + syncope HEENT: Denies headaches, ear pain, changes in vision, rhinorrhea, sore throat CV: Denies CP, palpitations, SOB, orthopnea Pulm: Denies SOB, cough, wheezing GI: Denies abdominal pain, nausea, vomiting, diarrhea, constipation GU: Denies dysuria, hematuria, frequency Msk: Denies muscle cramps, joint pains Neuro: Denies weakness, numbness, tingling Skin: Denies rashes, bruising Psych: Denies depression, anxiety, hallucinations    Objective:    Blood pressure 118/82, pulse 77, temperature 98.1 F (36.7 C), temperature source Oral, weight 180 lb 9.6 oz (81.9 kg), SpO2 97 %.  Gen. Pleasant, well-nourished, in no distress, normal affect   HEENT: [place]/AT, face  symmetric, conjunctiva clear, no scleral icterus, PERRLA, EOMI, nares patent without drainage. Neck: No JVD, no thyromegaly, no carotid bruits Lungs: no accessory muscle use, CTAB, no wheezes or rales Cardiovascular: RRR, no m/r/g, no peripheral edema Musculoskeletal: No deformities, no cyanosis or clubbing, normal tone Neuro:  A&Ox3, CN II-XII intact, normal gait Skin:  Warm, no lesions/ rash   Wt Readings from Last 3 Encounters:  08/07/21 180 lb 9.6 oz (81.9 kg)  08/02/21 180 lb (81.6 kg)  04/23/21 182 lb 6.4 oz (82.7 kg)    Lab Results  Component Value Date   WBC 4.6 02/14/2021   HGB 9.3 (L) 02/14/2021   HCT 28.1 (L) 02/14/2021   PLT 468.0 (H) 02/14/2021   GLUCOSE 102 (H) 02/14/2021   CHOL 220 (H) 10/09/2010   TRIG 62.0 10/09/2010   HDL 42.50 10/09/2010   LDLDIRECT 163.0 10/09/2010   ALT 28 01/29/2021   AST 22 01/29/2021   NA 139 02/14/2021   K 4.1 02/14/2021   CL 106 02/14/2021   CREATININE 1.03 02/14/2021   BUN 9 02/14/2021   CO2 26 02/14/2021   TSH 0.89 10/09/2010   PSA 1.47 10/09/2010   INR 1.3 (H) 02/04/2021    Assessment/Plan:  Syncope, unspecified syncope type -Syncope likely 2/2 dehydration/hypoglycemia.  Also consider arrhythmia though EKGs have been normal. -Lifestyle modification strongly encouraged. -Discussed obtaining labs to evaluate cholesterol and for thyroid dysfunction -Given strict precautions -Consider Holter monitor for continued symptoms.  - Plan: POC HgB A1c  F/u  next week for CPE and labs  More than 50% of over 31 minutes spent in total in caring for this patient face-to-face, on chart review, and on counseling and/or coordinating care.   Grier Mitts, MD

## 2021-08-08 ENCOUNTER — Other Ambulatory Visit: Payer: Federal, State, Local not specified - PPO

## 2021-08-08 ENCOUNTER — Other Ambulatory Visit: Payer: Self-pay

## 2021-08-14 ENCOUNTER — Encounter: Payer: Self-pay | Admitting: Family Medicine

## 2021-08-14 ENCOUNTER — Ambulatory Visit (INDEPENDENT_AMBULATORY_CARE_PROVIDER_SITE_OTHER): Payer: Medicare Other | Admitting: Family Medicine

## 2021-08-14 ENCOUNTER — Other Ambulatory Visit: Payer: Self-pay

## 2021-08-14 VITALS — BP 120/84 | HR 65 | Temp 97.9°F | Ht 70.0 in | Wt 181.0 lb

## 2021-08-14 DIAGNOSIS — R55 Syncope and collapse: Secondary | ICD-10-CM | POA: Diagnosis not present

## 2021-08-14 DIAGNOSIS — I10 Essential (primary) hypertension: Secondary | ICD-10-CM

## 2021-08-14 DIAGNOSIS — Z13220 Encounter for screening for lipoid disorders: Secondary | ICD-10-CM

## 2021-08-14 LAB — COMPREHENSIVE METABOLIC PANEL
ALT: 19 U/L (ref 0–53)
AST: 15 U/L (ref 0–37)
Albumin: 4.1 g/dL (ref 3.5–5.2)
Alkaline Phosphatase: 61 U/L (ref 39–117)
BUN: 15 mg/dL (ref 6–23)
CO2: 27 mEq/L (ref 19–32)
Calcium: 9.4 mg/dL (ref 8.4–10.5)
Chloride: 101 mEq/L (ref 96–112)
Creatinine, Ser: 0.97 mg/dL (ref 0.40–1.50)
GFR: 81.86 mL/min (ref >60.00)
Glucose, Bld: 85 mg/dL (ref 70–99)
Potassium: 4.5 mEq/L (ref 3.5–5.1)
Sodium: 136 mEq/L (ref 135–145)
Total Bilirubin: 0.8 mg/dL (ref 0.2–1.2)
Total Protein: 7.2 g/dL (ref 6.0–8.3)

## 2021-08-14 LAB — CBC WITH DIFFERENTIAL/PLATELET
Basophils Absolute: 0 10*3/uL (ref 0.0–0.1)
Basophils Relative: 0.3 % (ref 0.0–3.0)
Eosinophils Absolute: 0.1 10*3/uL (ref 0.0–0.7)
Eosinophils Relative: 1.2 % (ref 0.0–5.0)
HCT: 40.1 % (ref 39.0–52.0)
Hemoglobin: 13.3 g/dL (ref 13.0–17.0)
Lymphocytes Relative: 54.8 % — ABNORMAL HIGH (ref 12.0–46.0)
Lymphs Abs: 2.2 10*3/uL (ref 0.7–4.0)
MCHC: 33.2 g/dL (ref 30.0–36.0)
MCV: 89.5 fl (ref 78.0–100.0)
Monocytes Absolute: 0.4 10*3/uL (ref 0.1–1.0)
Monocytes Relative: 9.3 % (ref 3.0–12.0)
Neutro Abs: 1.4 10*3/uL (ref 1.4–7.7)
Neutrophils Relative %: 34.4 % — ABNORMAL LOW (ref 43.0–77.0)
Platelets: 255 10*3/uL (ref 150.0–400.0)
RBC: 4.48 Mil/uL (ref 4.22–5.81)
RDW: 19.3 % — ABNORMAL HIGH (ref 11.5–15.5)
WBC: 4 10*3/uL (ref 4.0–10.5)

## 2021-08-14 LAB — LIPID PANEL
Cholesterol: 227 mg/dL — ABNORMAL HIGH (ref 0–200)
HDL: 45.9 mg/dL (ref >39.00)
LDL Cholesterol: 166 mg/dL — ABNORMAL HIGH (ref 0–99)
NonHDL: 181.43
Total CHOL/HDL Ratio: 5
Triglycerides: 79 mg/dL (ref 0.0–149.0)
VLDL: 15.8 mg/dL (ref 0.0–40.0)

## 2021-08-14 LAB — T4, FREE: Free T4: 0.75 ng/dL (ref 0.60–1.60)

## 2021-08-14 LAB — TSH: TSH: 1.83 u[IU]/mL (ref 0.35–5.50)

## 2021-08-14 NOTE — Progress Notes (Signed)
Subjective:    Patient ID: Jay Robertson, male    DOB: [birth year], 65 y.o.   MRN: [id number]  Chief Complaint  Patient presents with   Follow-up    HPI Patient was seen today for follow-up and labs.  Since last OFV for pt's syncopal episode he has been increasing p.o. intake of water and completely changing his diet.  No longer eating 8 strips of bacon.  Has home BP cuff with him this visit.    Past Medical History:  Diagnosis Date   ALLERGIC RHINITIS 10/24/2007   GI bleed    HYPERLIPIDEMIA 10/16/2010   HYPERTENSION 07/25/2007    Allergies  Allergen Reactions   Doxycycline Other (See Comments)    Headaches    ROS General: Denies fever, chills, night sweats, changes in weight, changes in appetite HEENT: Denies headaches, ear pain, changes in vision, rhinorrhea, sore throat CV: Denies CP, palpitations, SOB, orthopnea Pulm: Denies SOB, cough, wheezing GI: Denies abdominal pain, nausea, vomiting, diarrhea, constipation GU: Denies dysuria, hematuria, frequency Msk: Denies muscle cramps, joint pains Neuro: Denies weakness, numbness, tingling Skin: Denies rashes, bruising Psych: Denies depression, anxiety, hallucinations   Objective:    Blood pressure 120/84, pulse 65, temperature 97.9 F (36.6 C), temperature source Oral, height '5\' 10"'$  (1.778 m), weight 181 lb (82.1 kg), SpO2 99 %.  Gen. Pleasant, well-nourished, in no distress, normal affect   HEENT: [place]/AT, face symmetric, conjunctiva clear, no scleral icterus, PERRLA, EOMI, nares patent without drainage, pharynx without erythema or exudate. Neck: No JVD, no thyromegaly, no carotid bruits Lungs: no accessory muscle use, CTAB, no wheezes or rales Cardiovascular: RRR, no m/r/g, no peripheral edema Musculoskeletal: No deformities, no cyanosis or clubbing, normal tone Neuro:  A&Ox3, CN II-XII intact, normal gait Skin:  Warm, no lesions/ rash   Wt Readings from Last 3 Encounters:  08/14/21 181 lb (82.1 kg)  08/07/21 180 lb 9.6  oz (81.9 kg)  08/02/21 180 lb (81.6 kg)    Lab Results  Component Value Date   WBC 4.6 02/14/2021   HGB 9.3 (L) 02/14/2021   HCT 28.1 (L) 02/14/2021   PLT 468.0 (H) 02/14/2021   GLUCOSE 102 (H) 02/14/2021   CHOL 220 (H) 10/09/2010   TRIG 62.0 10/09/2010   HDL 42.50 10/09/2010   LDLDIRECT 163.0 10/09/2010   ALT 28 01/29/2021   AST 22 01/29/2021   NA 139 02/14/2021   K 4.1 02/14/2021   CL 106 02/14/2021   CREATININE 1.03 02/14/2021   BUN 9 02/14/2021   CO2 26 02/14/2021   TSH 0.89 10/09/2010   PSA 1.47 10/09/2010   INR 1.3 (H) 02/04/2021   HGBA1C 5.6 08/07/2021    Assessment/Plan:  Essential hypertension -Controlled -Patient's home BP cuff readings elevated.  Advised to contact manufacture or new cuff. -Lifestyle modifications -Continue spironolactone 25 mg daily, benazepril 20 mg daily - Plan: Comprehensive metabolic panel  Syncope, unspecified syncope type  -Currently stable. -Advised syncopal episode likely 2/2 dehydration/orthostatic hypotension -Hemoglobin A1c 5.6% on 08/07/2021 -Will obtain labs -Continue lifestyle modifications -Continue to worsen symptoms Holter monitor - Plan: TSH, T4, Free, Lipid panel, CBC with Differential/Platelet, Comprehensive metabolic panel  Screening for cholesterol level  -Continue lifestyle modifications - Plan: Lipid panel  F/u 3 months.  Grier Mitts, MD

## 2021-08-19 ENCOUNTER — Telehealth: Payer: Self-pay

## 2021-08-19 NOTE — Telephone Encounter (Signed)
Patient called requesting Potassium and Magnesium lab results when picking up Cholesterol lab results

## 2021-08-19 NOTE — Telephone Encounter (Signed)
Lab results printed off and left at front desk. Potassium is on results but magnesium is not. Magnesium was not ordered with routine labs.

## 2021-10-19 ENCOUNTER — Other Ambulatory Visit: Payer: Self-pay | Admitting: Family Medicine

## 2021-10-19 DIAGNOSIS — L739 Follicular disorder, unspecified: Secondary | ICD-10-CM

## 2021-11-03 ENCOUNTER — Other Ambulatory Visit: Payer: Self-pay | Admitting: Family Medicine

## 2021-11-03 ENCOUNTER — Telehealth: Payer: Self-pay

## 2021-11-03 NOTE — Telephone Encounter (Addendum)
Patient called requesting a Rx refill  spironolactone (ALDACTONE) 25 MG tablet   Patient called back to check on the status of refill and stated he is completely out patient was informed PCP is seeing patient's and will respond at her earliest convenience

## 2021-11-06 NOTE — Telephone Encounter (Signed)
Med was refilled

## 2021-12-04 ENCOUNTER — Telehealth: Payer: Self-pay

## 2021-12-04 NOTE — Telephone Encounter (Signed)
Patient called to schedule an appt stating he is feeling faint and was transferred to a triage nurse the call was returned and patient was informed he should be seen at an Urgent care or ED patient refused stating he would wait for next available appt with PCP

## 2021-12-05 ENCOUNTER — Ambulatory Visit (INDEPENDENT_AMBULATORY_CARE_PROVIDER_SITE_OTHER): Payer: Medicare Other | Admitting: Family Medicine

## 2021-12-05 ENCOUNTER — Encounter: Payer: Self-pay | Admitting: Family Medicine

## 2021-12-05 VITALS — BP 134/80 | HR 70 | Temp 98.2°F | Wt 187.0 lb

## 2021-12-05 DIAGNOSIS — I10 Essential (primary) hypertension: Secondary | ICD-10-CM | POA: Diagnosis not present

## 2021-12-05 DIAGNOSIS — R42 Dizziness and giddiness: Secondary | ICD-10-CM

## 2021-12-05 NOTE — Progress Notes (Signed)
Subjective:    Patient ID: Jay Robertson, male    DOB: [birth year], 65 y.o.   MRN: [id number]  Chief Complaint  Patient presents with   Loss of Consciousness    Feeling faint, started yesterday after eating [employer]. Felt faint this morning, but not as bad as yesterday.    HPI Patient was seen today for acute concern.  Patient endorses "dizziness and heart "flurries" after eating a quarter pounder and a chicken sandwich with fries from local fast food restaurants.  Patient endorses getting off his diet.  Drinking less water and eating more fast food.  Having a bacon egg and cheese biscuit each morning.  Patient had similar symptoms several months ago after not eating and eating extra salt. Blood pressure at home 143/91.  May check BP once a week.  Taking benazepril 20 mg.  Pt concerned his he has diabetes.  5.6% at visit on 08/07/2021.  Drinking 2 quarts of apple juice per week as now only drinking 2 sodas per week.  Past Medical History:  Diagnosis Date   ALLERGIC RHINITIS 10/24/2007   GI bleed    HYPERLIPIDEMIA 10/16/2010   HYPERTENSION 07/25/2007    Allergies  Allergen Reactions   Doxycycline Other (See Comments)    Headaches    ROS General: Denies fever, chills, night sweats, changes in weight, changes in appetite HEENT: Denies headaches, ear pain, changes in vision, rhinorrhea, sore throat  +dizziness CV: Denies CP, palpitations, SOB, orthopnea Pulm: Denies SOB, cough, wheezing GI: Denies abdominal pain, nausea, vomiting, diarrhea, constipation GU: Denies dysuria, hematuria, frequency Msk: Denies muscle cramps, joint pains Neuro: Denies weakness, numbness, tingling Skin: Denies rashes, bruising Psych: Denies depression, anxiety, hallucinations     Objective:    Blood pressure 134/80, pulse 70, temperature 98.2 F (36.8 C), temperature source Oral, weight 187 lb (84.8 kg), SpO2 99 %.  Gen. Pleasant, well-nourished, in no distress, normal affect   HEENT: [place]/AT, face  symmetric, conjunctiva clear, no scleral icterus, PERRLA, EOMI, nares patent without drainage Neck: No JVD, no thyromegaly, no carotid bruits Lungs: no accessory muscle use, CTAB, no wheezes or rales Cardiovascular: RRR, no m/r/g, no peripheral edema Musculoskeletal: No deformities, no cyanosis or clubbing, normal tone Neuro:  A&Ox3, CN II-XII intact, normal gait Skin:  Warm, no lesions/ rash   Wt Readings from Last 3 Encounters:  12/05/21 187 lb (84.8 kg)  08/14/21 181 lb (82.1 kg)  08/07/21 180 lb 9.6 oz (81.9 kg)    Lab Results  Component Value Date   WBC 4.0 08/14/2021   HGB 13.3 08/14/2021   HCT 40.1 08/14/2021   PLT 255.0 08/14/2021   GLUCOSE 85 08/14/2021   CHOL 227 (H) 08/14/2021   TRIG 79.0 08/14/2021   HDL 45.90 08/14/2021   LDLDIRECT 163.0 10/09/2010   LDLCALC 166 (H) 08/14/2021   ALT 19 08/14/2021   AST 15 08/14/2021   NA 136 08/14/2021   K 4.5 08/14/2021   CL 101 08/14/2021   CREATININE 0.97 08/14/2021   BUN 15 08/14/2021   CO2 27 08/14/2021   TSH 1.83 08/14/2021   PSA 1.47 10/09/2010   INR 1.3 (H) 02/04/2021   HGBA1C 5.6 08/07/2021    Assessment/Plan:  Lightheadedness -Improving  Essential hypertension -Elevated  -Discussed symptoms likely 2/2 increased sodium intake and decreased p.o. intake of water.  Reassured hemoglobin A1c on 08/07/2021 at 5.6%.  Patient advised to take lifestyle modifications more seriously as was asymptomatic when eating better..  Discussed increasing benazepril from 20 mg, but  patient declines at this time.  Continue benazepril 20 mg and spironolactone milligrams daily.  Continue checking BP at home more frequently and bring the log to clinic.  Given strict precautions  F/u in 4-6 weeks  Grier Mitts, MD

## 2021-12-10 ENCOUNTER — Other Ambulatory Visit: Payer: Self-pay | Admitting: Family Medicine

## 2021-12-31 ENCOUNTER — Encounter: Payer: Self-pay | Admitting: Gastroenterology

## 2022-01-26 ENCOUNTER — Emergency Department (HOSPITAL_COMMUNITY)
Admission: EM | Admit: 2022-01-26 | Discharge: 2022-01-27 | Disposition: A | Discharge status: Home / Self Care | Payer: Medicare Other | Attending: Emergency Medicine | Admitting: Emergency Medicine

## 2022-01-26 DIAGNOSIS — R55 Syncope and collapse: Secondary | ICD-10-CM | POA: Diagnosis present

## 2022-01-26 DIAGNOSIS — E860 Dehydration: Secondary | ICD-10-CM | POA: Diagnosis not present

## 2022-01-26 DIAGNOSIS — I10 Essential (primary) hypertension: Secondary | ICD-10-CM | POA: Diagnosis not present

## 2022-01-26 LAB — CBG MONITORING, ED: Glucose-Capillary: 107 mg/dL — ABNORMAL HIGH (ref 70–99)

## 2022-01-26 LAB — CBC WITH DIFFERENTIAL/PLATELET
Abs Immature Granulocytes: 0.02 10*3/uL (ref 0.00–0.07)
Basophils Absolute: 0 10*3/uL (ref 0.0–0.1)
Basophils Relative: 1 %
Eosinophils Absolute: 0.1 10*3/uL (ref 0.0–0.5)
Eosinophils Relative: 1 %
HCT: 34.4 % — ABNORMAL LOW (ref 39.0–52.0)
Hemoglobin: 12.2 g/dL — ABNORMAL LOW (ref 13.0–17.0)
Immature Granulocytes: 0 %
Lymphocytes Relative: 45 %
Lymphs Abs: 2.8 10*3/uL (ref 0.7–4.0)
MCH: 33.2 pg (ref 26.0–34.0)
MCHC: 35.5 g/dL (ref 30.0–36.0)
MCV: 93.7 fL (ref 80.0–100.0)
Monocytes Absolute: 0.6 10*3/uL (ref 0.1–1.0)
Monocytes Relative: 10 %
Neutro Abs: 2.8 10*3/uL (ref 1.7–7.7)
Neutrophils Relative %: 43 %
Platelets: 200 10*3/uL (ref 150–400)
RBC: 3.67 MIL/uL — ABNORMAL LOW (ref 4.22–5.81)
RDW: 13.9 % (ref 11.5–15.5)
WBC: 6.4 10*3/uL (ref 4.0–10.5)
nRBC: 0 % (ref 0.0–0.2)

## 2022-01-26 MED ORDER — SODIUM CHLORIDE 0.9 % IV BOLUS
1000.0000 mL | Freq: Once | INTRAVENOUS | Status: AC
  Administered 2022-01-26: 1000 mL via INTRAVENOUS

## 2022-01-26 NOTE — ED Triage Notes (Signed)
Patient BIBEMS from home. Patient was laying down and had cramping, got up to walk the cramp out when he then synopsized on the chair after sitting back down. No injury, did not hit head.  Patient cool and diaphoretic upon ems arrival. Patient states "I think I did to  much and did not drink enough" EMS gave 1L of NS.

## 2022-01-26 NOTE — ED Provider Notes (Signed)
[facility] EMERGENCY DEPARTMENT Provider Note   CSN: [id number] Arrival date & time: 01/26/22  2324     History  No chief complaint on file.   Jay Robertson Sr. is a 66 y.o. male.  HPI     This is a 66 year old male who presents with lightheadedness and near syncope.  Patient reports that he began to feel cramping in his upper legs.  He got out of bed.  He went to the bathroom and urinated.  He subsequently came back and began to feel lightheaded.  He sat down and stated he continued to feel "faint."  Denies room spinning dizziness.  Reports lightheadedness.  He did not actually lose consciousness.  He was given a liter of fluid by EMS and states he feels much better now.  He is back to baseline.  Did not experience any chest pain, shortness of breath, abdominal pain, nausea, vomiting.  Denies any recent illnesses or fevers.  Does report that generally he does not feel hydrated.  He has been drinking a lot of sodas recently.  He did take his blood pressure medications tonight including benazepril and spironolactone.  Home Medications Prior to Admission medications   Medication Sig Start Date End Date Taking? Authorizing Provider  benazepril (LOTENSIN) 20 MG tablet TAKE 1 TABLET BY MOUTH EVERY DAY 12/12/21   Billie Ruddy, MD  Fexofenadine-Pseudoephedrine (ALLEGRA-D 24 HOUR PO) Take 1 tablet by mouth daily.    [provider]  linezolid (ZYVOX) 600 MG tablet TAKE 1 TABLET (600 MG TOTAL) BY MOUTH TWO TIMES DAILY FOR 14 DAYS. 02/07/21 02/07/22  Hosie Poisson, MD  spironolactone (ALDACTONE) 25 MG tablet TAKE 1 TABLET BY MOUTH EVERY DAY 11/05/21   Billie Ruddy, MD  triamcinolone cream (KENALOG) 0.1 % APPLY TOPICALLY TWICE A DAY 10/20/21   Billie Ruddy, MD      Allergies    Doxycycline    Review of Systems   Review of Systems  Constitutional:  Negative for fever.  Cardiovascular:  Negative for palpitations.  Musculoskeletal:        Leg cramping  All  other systems reviewed and are negative.  Physical Exam Updated Vital Signs BP 132/83    Pulse 72    Temp 97.7 F (36.5 C) (Oral)    Resp 16    SpO2 98%  Physical Exam Vitals and nursing note reviewed.  Constitutional:      Appearance: He is well-developed. He is not ill-appearing.  HENT:     Head: Normocephalic and atraumatic.     Nose: Nose normal.     Mouth/Throat:     Mouth: Mucous membranes are moist.  Eyes:     Extraocular Movements: Extraocular movements intact.     Pupils: Pupils are equal, round, and reactive to light.  Cardiovascular:     Rate and Rhythm: Normal rate and regular rhythm.     Heart sounds: Normal heart sounds. No murmur heard. Pulmonary:     Effort: Pulmonary effort is normal. No respiratory distress.     Breath sounds: Normal breath sounds. No wheezing.  Abdominal:     General: Bowel sounds are normal.     Palpations: Abdomen is soft.     Tenderness: There is no abdominal tenderness. There is no rebound.  Musculoskeletal:        General: No tenderness.     Cervical back: Neck supple.     Right lower leg: No edema.     Left lower  leg: No edema.  Lymphadenopathy:     Cervical: No cervical adenopathy.  Skin:    General: Skin is warm and dry.  Neurological:     Mental Status: He is alert and oriented to person, place, and time.     Comments: 5 out of 5 strength in all 4 extremities  Psychiatric:        Mood and Affect: Mood normal.    ED Results / Procedures / Treatments   Labs (all labs ordered are listed, but only abnormal results are displayed) Labs Reviewed  CBC WITH DIFFERENTIAL/PLATELET - Abnormal; Notable for the following components:      Result Value   RBC 3.67 (*)    Hemoglobin 12.2 (*)    HCT 34.4 (*)    All other components within normal limits  BASIC METABOLIC PANEL - Abnormal; Notable for the following components:   Glucose, Bld 108 (*)    Calcium 8.7 (*)    All other components within normal limits  URINALYSIS, ROUTINE W  REFLEX MICROSCOPIC - Abnormal; Notable for the following components:   Hgb urine dipstick TRACE (*)    All other components within normal limits  URINALYSIS, MICROSCOPIC (REFLEX) - Abnormal; Notable for the following components:   Bacteria, UA RARE (*)    All other components within normal limits  CBG MONITORING, ED - Abnormal; Notable for the following components:   Glucose-Capillary 107 (*)    All other components within normal limits  CK    EKG EKG Interpretation  Date/Time:  Monday January 26 2022 23:29:08 EST Ventricular Rate:  67 PR Interval:  201 QRS Duration: 94 QT Interval:  396 QTC Calculation: 418 R Axis:   73 Text Interpretation: Sinus rhythm Confirmed by Thayer Jew [phone]) on 01/27/2022 12:01:18 AM  Radiology No results found.  Procedures Procedures    Medications Ordered in ED Medications  sodium chloride 0.9 % bolus 1,000 mL (0 mLs Intravenous Stopped 01/27/22 0045)    ED Course/ Medical Decision Making/ A&P                           Medical Decision Making Amount and/or Complexity of Data Reviewed Labs: ordered.   This patient presents to the ED for concern of near syncope, this involves an extensive number of treatment options, and is a complaint that carries with it a high risk of complications and morbidity.  The differential diagnosis includes dehydration, arrhythmia, vasovagal episode, less likely stroke  MDM:    This is a 66 year old male with history of hypertension who presents with near syncope.  He is nontoxic.  Vital signs are reassuring.  He is afebrile.  He is not hypotensive.  Patient states that he is back to baseline.  He received 1 L of fluids by EMS.  He is neurologically intact.  No red flags.  EKG without acute ischemic or arrhythmic changes.  He does take a diuretic.  He would be at increased risk for metabolic derangements.  He has no lower extremity swelling or asymmetry to suggest DVT.  No shortness of breath and no hypoxia.   Doubt PE.  Labs obtained.  No significant metabolic derangements.  CBC normal.  No evidence of UTI.  On recheck, he remains clinically stable and cardiac monitoring remains unremarkable.  Discussed aggressive hydration and follow-up with PCP (Labs, imaging)  Labs: I Ordered, and personally interpreted labs.  The pertinent results include: Normal CBC, BMP, urinalysis  Imaging Studies ordered:  I ordered imaging studies including none I independently visualized and interpreted imaging. I agree with the radiologist interpretation  Additional history obtained from sister.  External records from outside source obtained and reviewed including prior visits  Critical Interventions: IV fluid  Consultations: I requested consultation with the none,  and discussed lab and imaging findings as well as pertinent plan - they recommend: None  Cardiac Monitoring: The patient was maintained on a cardiac monitor.  I personally viewed and interpreted the cardiac monitored which showed an underlying rhythm of: Normal sinus rhythm  Reevaluation: After the interventions noted above, I reevaluated the patient and found that they have :improved   Considered admission for: Near syncope  Social Determinants of Health: Lives independent  Disposition: Discharge with PCP follow-up  Co morbidities that complicate the patient evaluation  Past Medical History:  Diagnosis Date   ALLERGIC RHINITIS 10/24/2007   GI bleed    HYPERLIPIDEMIA 10/16/2010   HYPERTENSION 07/25/2007     Medicines Meds ordered this encounter  Medications   sodium chloride 0.9 % bolus 1,000 mL    I have reviewed the patients home medicines and have made adjustments as needed  Problem List / ED Course: Problem List Items Addressed This Visit   None Visit Diagnoses     Near syncope    -  Primary   Dehydration                       Final Clinical Impression(s) / ED Diagnoses Final diagnoses:  Near syncope   Dehydration    Rx / DC Orders ED Discharge Orders     None         Merryl Hacker, MD 01/27/22 [phone]

## 2022-01-27 LAB — URINALYSIS, ROUTINE W REFLEX MICROSCOPIC
Bilirubin Urine: NEGATIVE
Glucose, UA: NEGATIVE mg/dL
Ketones, ur: NEGATIVE mg/dL
Leukocytes,Ua: NEGATIVE
Nitrite: NEGATIVE
Protein, ur: NEGATIVE mg/dL
Specific Gravity, Urine: 1.02 (ref 1.005–1.030)
pH: 6 (ref 5.0–8.0)

## 2022-01-27 LAB — BASIC METABOLIC PANEL
Anion gap: 8 (ref 5–15)
BUN: 15 mg/dL (ref 8–23)
CO2: 24 mmol/L (ref 22–32)
Calcium: 8.7 mg/dL — ABNORMAL LOW (ref 8.9–10.3)
Chloride: 105 mmol/L (ref 98–111)
Creatinine, Ser: 1.18 mg/dL (ref 0.61–1.24)
GFR, Estimated: >60 mL/min (ref >60)
Glucose, Bld: 108 mg/dL — ABNORMAL HIGH (ref 70–99)
Potassium: 3.7 mmol/L (ref 3.5–5.1)
Sodium: 137 mmol/L (ref 135–145)

## 2022-01-27 LAB — URINALYSIS, MICROSCOPIC (REFLEX): Squamous Epithelial / HPF: NONE SEEN (ref 0–5)

## 2022-01-27 LAB — CK: Total CK: 171 U/L (ref 49–397)

## 2022-01-27 NOTE — Discharge Instructions (Signed)
You were seen today for an episode of near syncope.  May be related to dehydration.  Follow-up with your primary physician.  Make sure that you are drinking electrolyte rich fluids.

## 2022-01-27 NOTE — ED Notes (Addendum)
Patient ambulated to bathroom with EMT Dorothea Ogle. Gait steady.

## 2022-02-03 ENCOUNTER — Ambulatory Visit (AMBULATORY_SURGERY_CENTER): Payer: Federal, State, Local not specified - PPO | Admitting: *Deleted

## 2022-02-03 ENCOUNTER — Other Ambulatory Visit: Payer: Self-pay

## 2022-02-03 VITALS — Ht 71.0 in | Wt 185.0 lb

## 2022-02-03 DIAGNOSIS — Z86010 Personal history of colonic polyps: Secondary | ICD-10-CM

## 2022-02-03 DIAGNOSIS — Z8711 Personal history of peptic ulcer disease: Secondary | ICD-10-CM

## 2022-02-03 MED ORDER — PEG 3350-KCL-NA BICARB-NACL 420 G PO SOLR: 4000.0000 mL | Freq: Once | ORAL | 0 refills | Status: AC

## 2022-02-03 NOTE — Progress Notes (Signed)

## 2022-02-04 ENCOUNTER — Encounter: Payer: Self-pay | Admitting: Family Medicine

## 2022-02-04 ENCOUNTER — Ambulatory Visit (INDEPENDENT_AMBULATORY_CARE_PROVIDER_SITE_OTHER): Payer: Medicare Other | Admitting: Family Medicine

## 2022-02-04 VITALS — BP 124/84 | HR 68 | Resp 16 | Ht 71.0 in | Wt 185.4 lb

## 2022-02-04 DIAGNOSIS — R350 Frequency of micturition: Secondary | ICD-10-CM

## 2022-02-04 DIAGNOSIS — E860 Dehydration: Secondary | ICD-10-CM | POA: Diagnosis not present

## 2022-02-04 DIAGNOSIS — R3129 Other microscopic hematuria: Secondary | ICD-10-CM

## 2022-02-04 DIAGNOSIS — Z125 Encounter for screening for malignant neoplasm of prostate: Secondary | ICD-10-CM | POA: Diagnosis not present

## 2022-02-04 DIAGNOSIS — I10 Essential (primary) hypertension: Secondary | ICD-10-CM

## 2022-02-04 LAB — COMPREHENSIVE METABOLIC PANEL
ALT: 22 U/L (ref 0–53)
AST: 15 U/L (ref 0–37)
Albumin: 4.3 g/dL (ref 3.5–5.2)
Alkaline Phosphatase: 59 U/L (ref 39–117)
BUN: 12 mg/dL (ref 6–23)
CO2: 27 mEq/L (ref 19–32)
Calcium: 9.7 mg/dL (ref 8.4–10.5)
Chloride: 101 mEq/L (ref 96–112)
Creatinine, Ser: 0.93 mg/dL (ref 0.40–1.50)
GFR: 85.82 mL/min (ref >60.00)
Glucose, Bld: 64 mg/dL — ABNORMAL LOW (ref 70–99)
Potassium: 4.2 mEq/L (ref 3.5–5.1)
Sodium: 136 mEq/L (ref 135–145)
Total Bilirubin: 0.8 mg/dL (ref 0.2–1.2)
Total Protein: 7.3 g/dL (ref 6.0–8.3)

## 2022-02-04 LAB — POCT URINALYSIS DIPSTICK
Bilirubin, UA: NEGATIVE
Blood, UA: POSITIVE
Glucose, UA: NEGATIVE
Ketones, UA: NEGATIVE
Protein, UA: POSITIVE — AB
Spec Grav, UA: 1.015 (ref 1.010–1.025)
Urobilinogen, UA: 0.2 E.U./dL
pH, UA: 5 (ref 5.0–8.0)

## 2022-02-04 LAB — URINALYSIS, ROUTINE W REFLEX MICROSCOPIC
Bilirubin Urine: NEGATIVE
Ketones, ur: NEGATIVE
Leukocytes,Ua: NEGATIVE
Nitrite: NEGATIVE
Specific Gravity, Urine: 1.015 (ref 1.000–1.030)
Total Protein, Urine: NEGATIVE
Urine Glucose: NEGATIVE
Urobilinogen, UA: 0.2 (ref 0.0–1.0)
pH: 6 (ref 5.0–8.0)

## 2022-02-04 LAB — CBC WITH DIFFERENTIAL/PLATELET
Basophils Absolute: 0 10*3/uL (ref 0.0–0.1)
Basophils Relative: 0.4 % (ref 0.0–3.0)
Eosinophils Absolute: 0.1 10*3/uL (ref 0.0–0.7)
Eosinophils Relative: 2.8 % (ref 0.0–5.0)
HCT: 43.2 % (ref 39.0–52.0)
Hemoglobin: 14.3 g/dL (ref 13.0–17.0)
Lymphocytes Relative: 50 % — ABNORMAL HIGH (ref 12.0–46.0)
Lymphs Abs: 2.2 10*3/uL (ref 0.7–4.0)
MCHC: 33.2 g/dL (ref 30.0–36.0)
MCV: 96 fl (ref 78.0–100.0)
Monocytes Absolute: 0.4 10*3/uL (ref 0.1–1.0)
Monocytes Relative: 9.9 % (ref 3.0–12.0)
Neutro Abs: 1.7 10*3/uL (ref 1.4–7.7)
Neutrophils Relative %: 36.9 % — ABNORMAL LOW (ref 43.0–77.0)
Platelets: 252 10*3/uL (ref 150.0–400.0)
RBC: 4.5 Mil/uL (ref 4.22–5.81)
RDW: 14.1 % (ref 11.5–15.5)
WBC: 4.5 10*3/uL (ref 4.0–10.5)

## 2022-02-04 LAB — HEMOGLOBIN A1C: Hgb A1c MFr Bld: 6.1 % (ref 4.6–6.5)

## 2022-02-04 LAB — PSA: PSA: 0.58 ng/mL (ref 0.10–4.00)

## 2022-02-04 MED ORDER — BENAZEPRIL HCL 40 MG PO TABS: 40.0000 mg | ORAL_TABLET | Freq: Every day | ORAL | 3 refills | Status: DC

## 2022-02-04 NOTE — Progress Notes (Signed)
Subjective:    Patient ID: Jay Pippin., male    DOB: Oct 15, 1956, 66 y.o.   MRN: [id number]  Chief Complaint  Patient presents with   Hypertension    Home BP machine reading higher    Follow-up    Colonoscopy scheduled later in Feb.     HPI Patient was seen today for f/u.  Pt seen in ED on 1/30 for near syncope and dehydration.  Pt states he thought he was drinking enough water, but had a little less during that time.  Typically drinking coffee and 3 16.9 oz bottles of water per day.  Labs from ED visit with hypocalcemia, calcium was 8.7 on 01/26/2029.  Pt endorses urinary frequency and nocturia.  May urinate 8 times per night.  States stream may be a little slower but feels like he is able to empty his bladder.  Patient has been on benazepril and spironolactone for several years.  Thinks the increased urination has been going on times years.  Denies dysuria, urinary leakage, or constipation as taking Metamucil daily.  Patient inquires about seeing a urologist and having a male provider check his prostate.    Pt mentions low back pain that started today.  States happens when moves in certain positions.  Patient denies heavy lifting, pushing pulling.  States has been sleeping on a couch that is too small for him times a couple of months while he is planning to move.  Patient has a colonoscopy scheduled later this month.   Past Medical History:  Diagnosis Date   ALLERGIC RHINITIS 10/24/2007   Blood transfusion without reported diagnosis 01/2021   Gastric ulcer    hx of gastric ulcer   GI bleed 01/2021   H. pylori infection    treated   Heart murmur    many years ago- no issues in the 1990's   HYPERLIPIDEMIA 10/16/2010   HYPERTENSION 07/25/2007    Allergies  Allergen Reactions   Doxycycline Other (See Comments)    Headaches    ROS General: Denies fever, chills, night sweats, changes in weight, changes in appetite dehydration, near syncope HEENT: Denies headaches, ear pain,  changes in vision, rhinorrhea, sore throat CV: Denies CP, palpitations, SOB, orthopnea Pulm: Denies SOB, cough, wheezing GI: Denies abdominal pain, nausea, vomiting, diarrhea, constipation GU: Denies dysuria, hematuria  +urinary frequency, nocturia Msk: Denies muscle cramps, joint pains +low back pain. Neuro: Denies weakness, numbness, tingling Skin: Denies rashes, bruising Psych: Denies depression, anxiety, hallucinations    Objective:    Blood pressure 124/84, pulse 68, resp. rate 16, height 5\' 11"  (1.803 m), weight 185 lb 6.4 oz (84.1 kg).  Gen. Pleasant, well-nourished, in no distress, normal affect   HEENT: [place]/AT, face symmetric, conjunctiva clear, no scleral icterus, PERRLA, EOMI, nares patent without drainage Lungs: no accessory muscle use, CTAB, no wheezes or rales Cardiovascular: RRR, no m/r/g, no peripheral edema Musculoskeletal: No deformities, no cyanosis or clubbing, normal tone Neuro:  A&Ox3, CN II-XII intact, normal gait Skin:  Warm, no lesions/ rash  Wt Readings from Last 3 Encounters:  02/04/22 185 lb 6.4 oz (84.1 kg)  02/03/22 185 lb (83.9 kg)  12/05/21 187 lb (84.8 kg)    Lab Results  Component Value Date   WBC 6.4 01/26/2022   HGB 12.2 (L) 01/26/2022   HCT 34.4 (L) 01/26/2022   PLT 200 01/26/2022   GLUCOSE 108 (H) 01/26/2022   CHOL 227 (H) 08/14/2021   TRIG 79.0 08/14/2021   HDL 45.90 08/14/2021  LDLDIRECT 163.0 10/09/2010   LDLCALC 166 (H) 08/14/2021   ALT 19 08/14/2021   AST 15 08/14/2021   NA 137 01/26/2022   K 3.7 01/26/2022   CL 105 01/26/2022   CREATININE 1.18 01/26/2022   BUN 15 01/26/2022   CO2 24 01/26/2022   TSH 1.83 08/14/2021   PSA 1.47 10/09/2010   INR 1.3 (H) 02/04/2021   HGBA1C 5.6 08/07/2021    Assessment/Plan:  Essential hypertension -Controlled -Given increased urinary frequency.  Spironolactone to see if patient notices a difference.  While adjusting medication will increase benazepril from 20 mg daily to 40 mg daily  for BP control. -Continue lifestyle modifications -Patient to check BP at home and keep a log to bring with him to clinic. - Plan: CMP, benazepril (LOTENSIN) 40 MG tablet  Dehydration  -increase po intake of water and fluids - Plan: CBC with Differential/Platelet, Hemoglobin A1c  Screening for prostate cancer -Discussed manual prostate exam if needed after labs obtained.  Patient will prefer a male to perform this. - Plan: PSA  Hypocalcemia -Calcium 8.7 on 01/26/2022  - Plan: CMP  Urinary frequency  -Discussed possible causes including medications, BPH -Discussed manual prostate exam to assess for BPH.  Patient would prefer a male provider and possible referral to urology.  We will determine after lab results. -POC UA with 1+ RBCs - Plan: POCT urinalysis dipstick  Microscopic hematuria -asymptomatic -POC UA with 1+ RBCs -Plan: Urinalysis with reflex microscopic  F/u in 1 month  Grier Mitts, MD

## 2022-02-04 NOTE — Patient Instructions (Addendum)
At today's visit we discussed stopping spironolactone 25 mg daily to see if you notice improvement in her frequent urination.  Because we are stopping the spironolactone we need to adjust the benazepril.  We will increase the benazepril from 20 mg daily to 40 mg daily.  A new prescription for benazepril 40 mg daily was sent to your pharmacy.  If you would like you can use the benazepril 20 mg tablets that you have at home, you would just need to take 2 tablets for a total dose of 40 mg.  If you notice a dry cough or the need to clear your throat while taking the increased dose of benazepril let us know as this sometimes can happen.  It resolves with stopping the benazepril.  While we are doing this continue monitoring of blood pressure and keeping a log to bring with you to clinic.  Also note if you notice a change in the urinary frequency.

## 2022-02-05 ENCOUNTER — Encounter (HOSPITAL_BASED_OUTPATIENT_CLINIC_OR_DEPARTMENT_OTHER): Payer: Self-pay | Admitting: Emergency Medicine

## 2022-02-05 ENCOUNTER — Emergency Department (HOSPITAL_BASED_OUTPATIENT_CLINIC_OR_DEPARTMENT_OTHER)
Admission: EM | Admit: 2022-02-05 | Discharge: 2022-02-05 | Disposition: A | Discharge status: Home / Self Care | Payer: Medicare Other | Attending: Emergency Medicine | Admitting: Emergency Medicine

## 2022-02-05 ENCOUNTER — Other Ambulatory Visit: Payer: Self-pay

## 2022-02-05 DIAGNOSIS — R42 Dizziness and giddiness: Secondary | ICD-10-CM | POA: Diagnosis not present

## 2022-02-05 DIAGNOSIS — R002 Palpitations: Secondary | ICD-10-CM | POA: Insufficient documentation

## 2022-02-05 DIAGNOSIS — I10 Essential (primary) hypertension: Secondary | ICD-10-CM | POA: Diagnosis not present

## 2022-02-05 LAB — CBC
HCT: 40.1 % (ref 39.0–52.0)
Hemoglobin: 14.1 g/dL (ref 13.0–17.0)
MCH: 32 pg (ref 26.0–34.0)
MCHC: 35.2 g/dL (ref 30.0–36.0)
MCV: 91.1 fL (ref 80.0–100.0)
Platelets: 235 10*3/uL (ref 150–400)
RBC: 4.4 MIL/uL (ref 4.22–5.81)
RDW: 13.6 % (ref 11.5–15.5)
WBC: 3.7 10*3/uL — ABNORMAL LOW (ref 4.0–10.5)
nRBC: 0 % (ref 0.0–0.2)

## 2022-02-05 LAB — URINALYSIS, ROUTINE W REFLEX MICROSCOPIC
Bilirubin Urine: NEGATIVE
Glucose, UA: NEGATIVE mg/dL
Hgb urine dipstick: NEGATIVE
Ketones, ur: NEGATIVE mg/dL
Nitrite: NEGATIVE
Protein, ur: NEGATIVE mg/dL
Specific Gravity, Urine: 1.01 (ref 1.005–1.030)
pH: 6.5 (ref 5.0–8.0)

## 2022-02-05 LAB — BASIC METABOLIC PANEL
Anion gap: 9 (ref 5–15)
BUN: 16 mg/dL (ref 8–23)
CO2: 24 mmol/L (ref 22–32)
Calcium: 9.5 mg/dL (ref 8.9–10.3)
Chloride: 103 mmol/L (ref 98–111)
Creatinine, Ser: 0.96 mg/dL (ref 0.61–1.24)
GFR, Estimated: >60 mL/min (ref >60)
Glucose, Bld: 97 mg/dL (ref 70–99)
Potassium: 3.9 mmol/L (ref 3.5–5.1)
Sodium: 136 mmol/L (ref 135–145)

## 2022-02-05 NOTE — Discharge Instructions (Addendum)
If you develop recurrent or worsening symptoms, chest pain, headache, vomiting, shortness of breath, or any other new/concerning symptoms then return to the ER for evaluation.

## 2022-02-05 NOTE — ED Notes (Signed)
Pt reports "feeling much better".

## 2022-02-05 NOTE — ED Triage Notes (Signed)
Pt arrives to ED with c/o lightheadedness and dizziness. He does reports that this is on-going for over a year. Over the past couple of weeks it has worsened and today pt was driving and felt like he was going to faint. Was seen a PCP yesterday for same and they stopped his spironolactone due to urinary frequency/dehydration. He denies CP, CT, SOB, dark/tarry stools.  He does reports occasional palpitations. Pain 0/10. Hx blood transfusions.

## 2022-02-05 NOTE — ED Provider Notes (Signed)
[facility] EMERGENC[facility] DEPT Provider Note   CSN: [id number] Arrival date & time: 02/05/22  0848     History  Chief Complaint  Patient presents with   Dizziness    OTHA MONICAL Sr. is a 66 y.o. male.  HPI 66 year old male presents with lightheadedness.  This has been on and off problem for a year or so.  He states that about 10 days ago he passed out and presented to the ER.  He saw his PCP yesterday.  However he gets lightheaded pretty much every time he wakes up in the morning.  He states typically his blood pressure is riding in the high 31D on the diastolic side.  Today however, when he went out to go do his routes he noticed the lightheadedness seem to be getting worse for several minutes.  Thought he might pass out.  He was about 5 minutes away from here so he came here but now he is feeling a lot better.  Never had any headache, chest pain, shortness of breath.  Has a history of GI bleeding but has not seen any recurrent black or bloody stools.  He states occasionally he will feel palpitations but not right now.  Home Medications Prior to Admission medications   Medication Sig Start Date End Date Taking? Authorizing Provider  benazepril (LOTENSIN) 40 MG tablet Take 1 tablet (40 mg total) by mouth daily. 02/04/22   Billie Ruddy, MD  Fexofenadine-Pseudoephedrine (ALLEGRA-D 24 HOUR PO) Take 1 tablet by mouth as needed.    [provider]  sildenafil (VIAGRA) 100 MG tablet Take 100 mg by mouth daily as needed for erectile dysfunction.    [provider]  triamcinolone cream (KENALOG) 0.1 % APPLY TOPICALLY TWICE A DAY 10/20/21   Billie Ruddy, MD      Allergies    Doxycycline    Review of Systems   Review of Systems  Respiratory:  Negative for shortness of breath.   Cardiovascular:  Positive for palpitations. Negative for chest pain.  Gastrointestinal:  Negative for abdominal pain and blood in stool.  Neurological:  Positive for  light-headedness.   Physical Exam Updated Vital Signs BP (!) 142/99 (BP Location: Left Arm)    Pulse 66    Temp 97.8 F (36.6 C) (Oral)    Resp 10    Ht 5\' 11"  (1.803 m)    Wt 84 kg    SpO2 100%    BMI 25.83 kg/m  Physical Exam Vitals and nursing note reviewed.  Constitutional:      General: He is not in acute distress.    Appearance: He is well-developed. He is not ill-appearing or diaphoretic.  HENT:     Head: Normocephalic and atraumatic.  Eyes:     Extraocular Movements: Extraocular movements intact.     Pupils: Pupils are equal, round, and reactive to light.  Cardiovascular:     Rate and Rhythm: Normal rate and regular rhythm.     Heart sounds: Normal heart sounds.  Pulmonary:     Effort: Pulmonary effort is normal.     Breath sounds: Normal breath sounds.  Abdominal:     Palpations: Abdomen is soft.     Tenderness: There is no abdominal tenderness.  Skin:    General: Skin is warm and dry.  Neurological:     Mental Status: He is alert.     Comments: CN 3-12 grossly intact. 5/5 strength in all 4 extremities. Grossly normal sensation. Normal finger to nose.  ED Results / Procedures / Treatments   Labs (all labs ordered are listed, but only abnormal results are displayed) Labs Reviewed  CBC - Abnormal; Notable for the following components:      Result Value   WBC 3.7 (*)    All other components within normal limits  URINALYSIS, ROUTINE W REFLEX MICROSCOPIC - Abnormal; Notable for the following components:   Color, Urine COLORLESS (*)    Leukocytes,Ua TRACE (*)    All other components within normal limits  BASIC METABOLIC PANEL  CBG MONITORING, ED    EKG EKG Interpretation  Date/Time:  Thursday February 05 2022 08:55:41 EST Ventricular Rate:  62 PR Interval:  188 QRS Duration: 93 QT Interval:  406 QTC Calculation: 413 R Axis:   67 Text Interpretation: Sinus rhythm Consider left atrial enlargement Abnormal R-wave progression, early transition Borderline T  wave abnormalities no significant change since Jan 26 2022 Confirmed by Sherwood Gambler [id number] on 02/05/2022 9:02:45 AM  Radiology No results found.  Procedures Procedures    Medications Ordered in ED Medications - No data to display  ED Course/ Medical Decision Making/ A&P                            Patient is well-appearing here.  His vital signs are unremarkable besides some mild hypertension.  Labs have been reviewed and are unremarkable including normal hemoglobin, normal renal function and electrolytes.  Slightly low WBC, but he has no signs/symptoms of infection.  ECG has been interpreted and is benign, especially when compared to last month.  Neuro exam is unremarkable.  I do not think any imaging would be helpful today.  I reviewed his PCP note from yesterday and they were adjusting his blood pressure meds and got to have him follow-up and thought he might be dehydrated.  Patient has declined IV fluids here.  He feels well.  I reviewed the cardiac monitoring and he has had occasional PVCs but no significant arrhythmia.  At this point, he feels stable for discharge and appears well and we will given return precautions.  Follow-up with PCP.        Final Clinical Impression(s) / ED Diagnoses Final diagnoses:  Lightheadedness    Rx / DC Orders ED Discharge Orders     None         Sherwood Gambler, MD 02/05/22 1026

## 2022-02-12 ENCOUNTER — Ambulatory Visit (INDEPENDENT_AMBULATORY_CARE_PROVIDER_SITE_OTHER): Payer: Medicare Other | Admitting: Family Medicine

## 2022-02-12 VITALS — BP 122/70 | HR 70 | Temp 97.8°F | Wt 182.8 lb

## 2022-02-12 DIAGNOSIS — R350 Frequency of micturition: Secondary | ICD-10-CM

## 2022-02-12 DIAGNOSIS — I10 Essential (primary) hypertension: Secondary | ICD-10-CM | POA: Diagnosis not present

## 2022-02-12 DIAGNOSIS — N401 Enlarged prostate with lower urinary tract symptoms: Secondary | ICD-10-CM | POA: Diagnosis not present

## 2022-02-12 MED ORDER — SPIRONOLACTONE 25 MG PO TABS: 25.0000 mg | ORAL_TABLET | Freq: Every day | ORAL | 3 refills | Status: DC

## 2022-02-12 MED ORDER — TAMSULOSIN HCL 0.4 MG PO CAPS: 0.4000 mg | ORAL_CAPSULE | Freq: Every day | ORAL | 3 refills | Status: DC

## 2022-02-12 MED ORDER — BENAZEPRIL HCL 20 MG PO TABS: 20.0000 mg | ORAL_TABLET | Freq: Every day | ORAL | 3 refills | Status: DC

## 2022-02-12 NOTE — Progress Notes (Signed)
Subjective:    Patient ID: Jay Robertson., male    DOB: [birth year], 66 y.o.   MRN: [id number]  Chief Complaint  Patient presents with   Follow-up    Seen in ed for BP. Started taking spironolactone again, has BP readings in the room, avg is around 158/100 at home    HPI Patient was seen today for follow-up.  At last OFV spironolactone 25 mg d/c'd and benazepril increased to 40 mg as patient concerned that medications were causing urinary frequency.  Pt states BP began increasing slowly, with the highest reading 182/109 on 2/14.  Pt restarted spironolactone.  Patient endorses urinary frequency during the day and night, up at least 10 times.  Patient drinking juice, water.  Cut down on caffeine.  PSA normal on 02/04/22.    Past Medical History:  Diagnosis Date   ALLERGIC RHINITIS 10/24/2007   Blood transfusion without reported diagnosis 01/2021   Gastric ulcer    hx of gastric ulcer   GI bleed 01/2021   H. pylori infection    treated   Heart murmur    many years ago- no issues in the 1990's   HYPERLIPIDEMIA 10/16/2010   HYPERTENSION 07/25/2007    Allergies  Allergen Reactions   Doxycycline Other (See Comments)    Headaches    ROS General: Denies fever, chills, night sweats, changes in weight, changes in appetite HEENT: Denies headaches, ear pain, changes in vision, rhinorrhea, sore throat CV: Denies CP, palpitations, SOB, orthopnea Pulm: Denies SOB, cough, wheezing GI: Denies abdominal pain, nausea, vomiting, diarrhea, constipation GU: Denies dysuria, hematuria, frequency  +urinary frequency Msk: Denies muscle cramps, joint pains Neuro: Denies weakness, numbness, tingling Skin: Denies rashes, bruising Psych: Denies depression, anxiety, hallucinations     Objective:    Blood pressure 122/70, pulse 70, temperature 97.8 F (36.6 C), temperature source Oral, weight 182 lb 12.8 oz (82.9 kg), SpO2 98 %.  Gen. Pleasant, well-nourished, in no distress, normal affect   HEENT:  [place]/AT, face symmetric, conjunctiva clear, no scleral icterus, PERRLA, EOMI, nares patent without drainage Lungs: no accessory muscle use, CTAB, no wheezes or rales Cardiovascular: RRR, no m/r/g, no peripheral edema Musculoskeletal: No deformities, no cyanosis or clubbing, normal tone Neuro:  A&Ox3, CN II-XII intact, normal gait Skin:  Warm, no lesions/ rash   Wt Readings from Last 3 Encounters:  02/12/22 182 lb 12.8 oz (82.9 kg)  02/05/22 185 lb 3 oz (84 kg)  02/04/22 185 lb 6.4 oz (84.1 kg)    Lab Results  Component Value Date   WBC 3.7 (L) 02/05/2022   HGB 14.1 02/05/2022   HCT 40.1 02/05/2022   PLT 235 02/05/2022   GLUCOSE 97 02/05/2022   CHOL 227 (H) 08/14/2021   TRIG 79.0 08/14/2021   HDL 45.90 08/14/2021   LDLDIRECT 163.0 10/09/2010   LDLCALC 166 (H) 08/14/2021   ALT 22 02/04/2022   AST 15 02/04/2022   NA 136 02/05/2022   K 3.9 02/05/2022   CL 103 02/05/2022   CREATININE 0.96 02/05/2022   BUN 16 02/05/2022   CO2 24 02/05/2022   TSH 1.83 08/14/2021   PSA 0.58 02/04/2022   INR 1.3 (H) 02/04/2021   HGBA1C 6.1 02/04/2022    Assessment/Plan:  Essential hypertension  -liable.  Controlled in clinic -elevation noted with d/c'ing spironolactone due to pt concern for increased urination and nocturia and increasing benazepril to 40 mg daily. -will d/c benazepril 40 mg. -restart previous meds benazepril 20 mg and spironolactone 25 mg  daily. -continue monitoring bp at home.  Consider obtaining a new bp cuff. -lifestyle modificaitons - Plan: spironolactone (ALDACTONE) 25 MG tablet, benazepril (LOTENSIN) 20 MG tablet  Benign prostatic hyperplasia with urinary frequency  -discussed possible causes of urinary frequency and nocturia. -PSA normal 02/04/22 -will start trial of flomax -for continued symptoms discussed making changes to bp meds. - Plan: tamsulosin (FLOMAX) 0.4 MG CAPS capsule  F/u in 1 month  More than 50% of over 31-33 minutes spent in total in caring for  this patient was spent face-to-face, reviewing the chart, counseling and/or coordinating care.   Grier Mitts, MD

## 2022-02-12 NOTE — Patient Instructions (Signed)
A new prescription for benazepril 20 mg daily and spironolactone 25 mg daily was sent to your pharmacy.  This should be the dose that you were previously on.  A prescription for Flomax was also sent to the pharmacy.  This is appropriate medication to help with urinary frequency.  Continue checking blood pressure at home and keeping a log to bring with you to clinic.

## 2022-02-16 ENCOUNTER — Encounter: Payer: Self-pay | Admitting: Family Medicine

## 2022-02-17 ENCOUNTER — Encounter: Payer: Self-pay | Admitting: Gastroenterology

## 2022-02-17 ENCOUNTER — Other Ambulatory Visit: Payer: Self-pay

## 2022-02-17 ENCOUNTER — Ambulatory Visit (AMBULATORY_SURGERY_CENTER): Payer: Medicare Other | Admitting: Gastroenterology

## 2022-02-17 VITALS — BP 105/66 | HR 77 | Temp 98.0°F | Resp 22 | Ht 70.0 in | Wt 185.0 lb

## 2022-02-17 DIAGNOSIS — D122 Benign neoplasm of ascending colon: Secondary | ICD-10-CM

## 2022-02-17 DIAGNOSIS — D120 Benign neoplasm of cecum: Secondary | ICD-10-CM

## 2022-02-17 DIAGNOSIS — K2970 Gastritis, unspecified, without bleeding: Secondary | ICD-10-CM | POA: Diagnosis not present

## 2022-02-17 DIAGNOSIS — Z86010 Personal history of colonic polyps: Secondary | ICD-10-CM

## 2022-02-17 DIAGNOSIS — D123 Benign neoplasm of transverse colon: Secondary | ICD-10-CM

## 2022-02-17 DIAGNOSIS — Z8711 Personal history of peptic ulcer disease: Secondary | ICD-10-CM | POA: Diagnosis not present

## 2022-02-17 DIAGNOSIS — K2950 Unspecified chronic gastritis without bleeding: Secondary | ICD-10-CM | POA: Diagnosis not present

## 2022-02-17 MED ORDER — OMEPRAZOLE 20 MG PO CPDR: 20.0000 mg | DELAYED_RELEASE_CAPSULE | Freq: Every day | ORAL | 3 refills | Status: DC

## 2022-02-17 MED ORDER — SODIUM CHLORIDE 0.9 % IV SOLN: 500.0000 mL | Freq: Once | INTRAVENOUS | Status: DC

## 2022-02-17 NOTE — Progress Notes (Signed)
Report given to PACU, vss 

## 2022-02-17 NOTE — Patient Instructions (Signed)
Handouts for diverticulosis polyps and hemorrhoids. Start Omeprazole as prescribed, 30 minutes prior to a meal once a day.  High fiber diet.  Use FiberCon 1-2 tablets orally a day.  Await pathology report.   YOU HAD AN ENDOSCOPIC PROCEDURE TODAY AT [facility]:   Refer to the procedure report that was given to you for any specific questions about what was found during the examination.  If the procedure report does not answer your questions, please call your gastroenterologist to clarify.  If you requested that your care partner not be given the details of your procedure findings, then the procedure report has been included in a sealed envelope for you to review at your convenience later.  YOU SHOULD EXPECT: Some feelings of bloating in the abdomen. Passage of more gas than usual.  Walking can help get rid of the air that was put into your GI tract during the procedure and reduce the bloating. If you had a lower endoscopy (such as a colonoscopy or flexible sigmoidoscopy) you may notice spotting of blood in your stool or on the toilet paper. If you underwent a bowel prep for your procedure, you may not have a normal bowel movement for a few days.  Please Note:  You might notice some irritation and congestion in your nose or some drainage.  This is from the oxygen used during your procedure.  There is no need for concern and it should clear up in a day or so.  SYMPTOMS TO REPORT IMMEDIATELY:  Following lower endoscopy (colonoscopy or flexible sigmoidoscopy):  Excessive amounts of blood in the stool  Significant tenderness or worsening of abdominal pains  Swelling of the abdomen that is new, acute  Fever of 100F or higher  Following upper endoscopy (EGD)  Vomiting of blood or coffee ground material  New chest pain or pain under the shoulder blades  Painful or persistently difficult swallowing  New shortness of breath  Fever of 100F or higher  Black, tarry-looking stools  For  urgent or emergent issues, a gastroenterologist can be reached at any hour by calling [phone]. Do not use MyChart messaging for urgent concerns.    DIET:  We do recommend a small meal at first, but then you may proceed to your regular diet.  Drink plenty of fluids but you should avoid alcoholic beverages for 24 hours.  ACTIVITY:  You should plan to take it easy for the rest of today and you should NOT DRIVE or use heavy machinery until tomorrow (because of the sedation medicines used during the test).    FOLLOW UP: Our staff will call the number listed on your records 48-72 hours following your procedure to check on you and address any questions or concerns that you may have regarding the information given to you following your procedure. If we do not reach you, we will leave a message.  We will attempt to reach you two times.  During this call, we will ask if you have developed any symptoms of COVID 19. If you develop any symptoms (ie: fever, flu-like symptoms, shortness of breath, cough etc.) before then, please call [phone].  If you test positive for Covid 19 in the 2 weeks post procedure, please call and report this information to [place].    If any biopsies were taken you will be contacted by phone or by letter within the next 1-3 weeks.  Please call us at [phone] if you have not heard about the biopsies in 3  weeks.    SIGNATURES/CONFIDENTIALITY: You and/or your care partner have signed paperwork which will be entered into your electronic medical record.  These signatures attest to the fact that that the information above on your After Visit Summary has been reviewed and is understood.  Full responsibility of the confidentiality of this discharge information lies with you and/or your care-partner.

## 2022-02-17 NOTE — Progress Notes (Signed)
Vitals-DT  Pt's states no medical or surgical changes since previsit or office visit.   Called to room to assist during endoscopic procedure.  Patient ID and intended procedure confirmed with present staff. Received instructions for my participation in the procedure from the performing physician.

## 2022-02-17 NOTE — Progress Notes (Signed)
GASTROENTEROLOGY PROCEDURE H&P NOTE   Primary Care Physician: Billie Ruddy, MD  HPI: Jay CENTOLA Sr. is a 66 y.o. male who presents for EGD/Colonoscopy to follow up previous gastric ulcer and also for colon cancer screening with previous polyp noted when patient had presented with diverticular hemorrhage and that polyp was not removed.  Past Medical History:  Diagnosis Date   ALLERGIC RHINITIS 10/24/2007   Blood transfusion without reported diagnosis 01/2021   Gastric ulcer    hx of gastric ulcer   GI bleed 01/2021   H. pylori infection    treated   Heart murmur    many years ago- no issues in the 1990's   HYPERLIPIDEMIA 10/16/2010   HYPERTENSION 07/25/2007   Past Surgical History:  Procedure Laterality Date   APPENDECTOMY     BIOPSY  02/01/2021   Procedure: BIOPSY;  Surgeon: Rush Landmark Telford Nab., MD;  Location: [facility];  Service: Gastroenterology;;   CLAVICLE SURGERY     possibly as a child? unknown if surgery- broke collarbone   COLONOSCOPY WITH PROPOFOL N/A 02/01/2021   Procedure: COLONOSCOPY WITH PROPOFOL;  Surgeon: Irving Copas., MD;  Location: [place];  Service: Gastroenterology;  Laterality: N/A;   ESOPHAGOGASTRODUODENOSCOPY (EGD) WITH PROPOFOL N/A 02/01/2021   Procedure: ESOPHAGOGASTRODUODENOSCOPY (EGD) WITH PROPOFOL;  Surgeon: Rush Landmark Telford Nab., MD;  Location: [place];  Service: Gastroenterology;  Laterality: N/A;   GIVENS CAPSULE STUDY N/A 02/03/2021   Procedure: GIVENS CAPSULE STUDY;  Surgeon: Yetta Flock, MD;  Location: [place];  Service: Gastroenterology;  Laterality: N/A;   NASAL SEPTUM SURGERY     septal deviation, turbinate hypertrophy and obstruction   Current Outpatient Medications  Medication Sig Dispense Refill   benazepril (LOTENSIN) 20 MG tablet Take 1 tablet (20 mg total) by mouth daily. 90 tablet 3   Fexofenadine-Pseudoephedrine (ALLEGRA-D 24 HOUR PO) Take 1 tablet by mouth as needed.     spironolactone  (ALDACTONE) 25 MG tablet Take 1 tablet (25 mg total) by mouth daily. 90 tablet 3   triamcinolone cream (KENALOG) 0.1 % APPLY TOPICALLY TWICE A DAY 60 g 2   sildenafil (VIAGRA) 100 MG tablet Take 100 mg by mouth daily as needed for erectile dysfunction.     tamsulosin (FLOMAX) 0.4 MG CAPS capsule Take 1 capsule (0.4 mg total) by mouth daily. 30 capsule 3   Current Facility-Administered Medications  Medication Dose Route Frequency Provider Last Rate Last Admin   0.9 %  sodium chloride infusion  500 mL Intravenous Once Mansouraty, Telford Nab., MD        Current Outpatient Medications:    benazepril (LOTENSIN) 20 MG tablet, Take 1 tablet (20 mg total) by mouth daily., Disp: 90 tablet, Rfl: 3   Fexofenadine-Pseudoephedrine (ALLEGRA-D 24 HOUR PO), Take 1 tablet by mouth as needed., Disp: , Rfl:    spironolactone (ALDACTONE) 25 MG tablet, Take 1 tablet (25 mg total) by mouth daily., Disp: 90 tablet, Rfl: 3   triamcinolone cream (KENALOG) 0.1 %, APPLY TOPICALLY TWICE A DAY, Disp: 60 g, Rfl: 2   sildenafil (VIAGRA) 100 MG tablet, Take 100 mg by mouth daily as needed for erectile dysfunction., Disp: , Rfl:    tamsulosin (FLOMAX) 0.4 MG CAPS capsule, Take 1 capsule (0.4 mg total) by mouth daily., Disp: 30 capsule, Rfl: 3  Current Facility-Administered Medications:    0.9 %  sodium chloride infusion, 500 mL, Intravenous, Once, Mansouraty, Telford Nab., MD Allergies  Allergen Reactions   Doxycycline Other (See Comments)  Headaches   Family History  Problem Relation Age of Onset   Hypertension Mother    Diabetes Brother    Colon cancer Neg Hx    Esophageal cancer Neg Hx    Pancreatic cancer Neg Hx    Stomach cancer Neg Hx    Liver disease Neg Hx    Hemochromatosis Neg Hx    Rectal cancer Neg Hx    Colon polyps Neg Hx    Social History   Socioeconomic History   Marital status: Single    Spouse name: Not on file   Number of children: Not on file   Years of education: Not on file    Highest education level: Not on file  Occupational History   Not on file  Tobacco Use   Smoking status: Never   Smokeless tobacco: Never   Tobacco comments:    pt notes he may have had 3-4 cigars yearly  Vaping Use   Vaping Use: Never used  Substance and Sexual Activity   Alcohol use: Yes    Comment: occ   Drug use: No   Sexual activity: Not on file  Other Topics Concern   Not on file  Social History Narrative   Not on file   Social Determinants of Health   Financial Resource Strain: Not on file  Food Insecurity: Not on file  Transportation Needs: Not on file  Physical Activity: Not on file  Stress: Not on file  Social Connections: Not on file  Intimate Partner Violence: Not on file    Physical Exam: Today's Vitals   02/17/22 1315  BP: 103/66  Pulse: 81  Temp: 98 F (36.7 C)  TempSrc: Temporal  SpO2: 97%  Weight: 185 lb (83.9 kg)  Height: 5\' 10"  (1.778 m)  PainSc: 0-No pain   Body mass index is 26.54 kg/m. GEN: NAD EYE: Sclerae anicteric ENT: MMM CV: Non-tachycardic GI: Soft, NT/ND NEURO:  Alert & Oriented x 3  Lab Results: No results for input(s): WBC, HGB, HCT, PLT in the last 72 hours. BMET No results for input(s): NA, K, CL, CO2, GLUCOSE, BUN, CREATININE, CALCIUM in the last 72 hours. LFT No results for input(s): PROT, ALBUMIN, AST, ALT, ALKPHOS, BILITOT, BILIDIR, IBILI in the last 72 hours. PT/INR No results for input(s): LABPROT, INR in the last 72 hours.   Impression / Plan: This is a 66 y.o.male who presents for EGD/Colonoscopy to follow up previous gastric ulcer and also for colon cancer screening with previous polyp noted when patient had presented with diverticular hemorrhage and that polyp was not removed.  The risks and benefits of endoscopic evaluation/treatment were discussed with the patient and/or family; these include but are not limited to the risk of perforation, infection, bleeding, missed lesions, lack of diagnosis, severe  illness requiring hospitalization, as well as anesthesia and sedation related illnesses.  The patient's history has been reviewed, patient examined, no change in status, and deemed stable for procedure.  The patient and/or family is agreeable to proceed.    Justice Britain, MD [facility] Office # [phone]

## 2022-02-17 NOTE — Op Note (Signed)
[facility] Patient Name: Jay Robertson Procedure Date: 02/17/2022 2:40 PM MRN: [id number] Endoscopist: Justice Britain , MD Age: 66 Referring MD:  Date of Birth: Aug 17, 1956 Gender: Male Account #: [id number] Procedure:                Colonoscopy Indications:              Screening for colorectal malignant neoplasm Medicines:                Monitored Anesthesia Care Procedure:                Pre-Anesthesia Assessment:                           - Prior to the procedure, a History and Physical                            was performed, and patient medications and                            allergies were reviewed. The patient's tolerance of                            previous anesthesia was also reviewed. The risks                            and benefits of the procedure and the sedation                            options and risks were discussed with the patient.                            All questions were answered, and informed consent                            was obtained. Prior Anticoagulants: The patient has                            taken no previous anticoagulant or antiplatelet                            agents. ASA Grade Assessment: II - A patient with                            mild systemic disease. After reviewing the risks                            and benefits, the patient was deemed in                            satisfactory condition to undergo the procedure.                           After obtaining informed consent, the colonoscope  was passed under direct vision. Throughout the                            procedure, the patient's blood pressure, pulse, and                            oxygen saturations were monitored continuously. The                            Olympus CF-HQ190L ([id number]) Colonoscope was                            introduced through the anus and advanced to the the                            cecum, identified by  appendiceal orifice and                            ileocecal valve. The colonoscopy was performed                            without difficulty. The patient tolerated the                            procedure. The quality of the bowel preparation was                            adequate. The ileocecal valve, appendiceal orifice,                            and rectum were photographed. Scope In: 2:53:00 PM Scope Out: 5:17:61 PM Scope Withdrawal Time: 0 hours 10 minutes 8 seconds  Total Procedure Duration: 0 hours 13 minutes 18 seconds  Findings:                 The digital rectal exam findings include                            hemorrhoids. Pertinent negatives include no                            palpable rectal lesions.                           Multiple small and large-mouthed diverticula were                            found in the recto-sigmoid colon, sigmoid colon,                            descending colon, hepatic flexure, ascending colon                            and cecum.  Three sessile polyps were found in the hepatic                            flexure, ascending colon and cecum. The polyps were                            2 to 7 mm in size. These polyps were removed with a                            cold snare. Resection and retrieval were complete.                           Normal mucosa was found in the entire colon                            otherwise.                           Non-bleeding non-thrombosed external and internal                            hemorrhoids were found during retroflexion, during                            perianal exam and during digital exam. The                            hemorrhoids were Grade II (internal hemorrhoids                            that prolapse but reduce spontaneously). Complications:            No immediate complications. Estimated Blood Loss:     Estimated blood loss was minimal. Impression:                - Hemorrhoids found on digital rectal exam.                           - Diverticulosis in the recto-sigmoid colon, in the                            sigmoid colon, in the descending colon, at the                            hepatic flexure, in the ascending colon and in the                            cecum.                           - Three 2 to 7 mm polyps at the hepatic flexure, in                            the ascending colon and in the cecum, removed with  a cold snare. Resected and retrieved.                           - Normal mucosa in the entire examined colon                            otherwise.                           - Non-bleeding non-thrombosed external and internal                            hemorrhoids. Recommendation:           - The patient will be observed post-procedure,                            until all discharge criteria are met.                           - Discharge patient to home.                           - Patient has a contact number available for                            emergencies. The signs and symptoms of potential                            delayed complications were discussed with the                            patient. Return to normal activities tomorrow.                            Written discharge instructions were provided to the                            patient.                           - High fiber diet.                           - Use FiberCon 1-2 tablets PO daily.                           - Continue present medications.                           - Await pathology results.                           - Repeat colonoscopy in 3/5/7 years for                            surveillance based on pathology results.                           -  The findings and recommendations were discussed                            with the patient.                           - The findings and recommendations were discussed                             with the patient's family. Justice Britain, MD 02/17/2022 3:17:54 PM

## 2022-02-17 NOTE — Progress Notes (Signed)
1442 Robinul 0.1 mg IV given due large amount of secretions upon assessment.  MD made aware, vss 

## 2022-02-17 NOTE — Op Note (Signed)
[place] Patient Name: Jay Robertson Procedure Date: 02/17/2022 2:41 PM MRN: [id number] Endoscopist: Justice Britain , MD Age: 66 Referring MD:  Date of Birth: 06/18/1956 Gender: Male Account #: [id number] Procedure:                Upper GI endoscopy Indications:              Follow-up of gastric ulcer, Follow-up of                            Helicobacter pylori Medicines:                Monitored Anesthesia Care Procedure:                Pre-Anesthesia Assessment:                           - Prior to the procedure, a History and Physical                            was performed, and patient medications and                            allergies were reviewed. The patient's tolerance of                            previous anesthesia was also reviewed. The risks                            and benefits of the procedure and the sedation                            options and risks were discussed with the patient.                            All questions were answered, and informed consent                            was obtained. Prior Anticoagulants: The patient has                            taken no previous anticoagulant or antiplatelet                            agents. ASA Grade Assessment: II - A patient with                            mild systemic disease. After reviewing the risks                            and benefits, the patient was deemed in                            satisfactory condition to undergo the procedure.  After obtaining informed consent, the endoscope was                            passed under direct vision. Throughout the                            procedure, the patient's blood pressure, pulse, and                            oxygen saturations were monitored continuously. The                            GIF [id number] #[id number] was introduced through the                            mouth, and advanced to the second part of duodenum.                             The upper GI endoscopy was accomplished without                            difficulty. The patient tolerated the procedure. Scope In: Scope Out: Findings:                 No gross lesions were noted in the entire esophagus.                           The Z-line was irregular and was found 43 cm from                            the incisors.                           Patchy mild inflammation characterized by erosions                            and erythema was found in the entire examined                            stomach. Biopsies were taken with a cold forceps                            for histology and Helicobacter pylori testing.                           No gross lesions were noted in the duodenal bulb,                            in the first portion of the duodenum and in the                            second portion of the duodenum. Complications:            No immediate complications. Estimated Blood Loss:  Estimated blood loss was minimal. Impression:               - No gross lesions in esophagus. Z-line irregular,                            43 cm from the incisors.                           - Gastritis. Biopsied.                           - No gross lesions in the duodenal bulb, in the                            first portion of the duodenum and in the second                            portion of the duodenum. Recommendation:           - Proceed to scheduled colonoscopy.                           - Observe patient's clinical course.                           - Recommend restarting low-dose PPI Omeprazole 20                            mg daily (take 30 minutes before a meal).                           - Await pathology results.                           - The findings and recommendations were discussed                            with the patient.                           - The findings and recommendations were discussed                             with the patient's family. Justice Britain, MD 02/17/2022 3:13:05 PM

## 2022-02-19 ENCOUNTER — Telehealth: Payer: Self-pay | Admitting: *Deleted

## 2022-02-19 ENCOUNTER — Telehealth: Payer: Self-pay

## 2022-02-19 NOTE — Telephone Encounter (Signed)
°  Follow up Call-  Call back number 02/17/2022  Post procedure Call Back phone  # [phone]  Permission to leave phone message Yes  Some recent data might be hidden     Patient questions:  Do you have a fever, pain , or abdominal swelling? No. Pain Score  0 *  Have you tolerated food without any problems? Yes.    Have you been able to return to your normal activities? Yes.    Do you have any questions about your discharge instructions: Diet   No. Medications  No. Follow up visit  No.  Do you have questions or concerns about your Care? No.  Actions: * If pain score is 4 or above: No action needed, pain <4.

## 2022-02-19 NOTE — Telephone Encounter (Signed)
Left message on follow up call. 

## 2022-02-20 ENCOUNTER — Telehealth: Payer: Self-pay | Admitting: Family Medicine

## 2022-02-20 NOTE — Telephone Encounter (Signed)
Patient called in requesting to speak with someone regarding his lab results from 02/08. Patient stated that he never received a call with his results and it has been weeks.  Please advise.

## 2022-02-23 NOTE — Telephone Encounter (Signed)
Spoke with pt, stated no one has talked to him about his labs done on 2/08. Wanted to know what was tested and the results. Informed him of the test, but he was wanting details of all that the test checked. Pt does not have MyChart, offered to print off his results, he said that would help and he would pick them up from front desk. Printed off results and placed in cabinet in front office.

## 2022-02-24 ENCOUNTER — Encounter: Payer: Self-pay | Admitting: Gastroenterology

## 2022-03-09 ENCOUNTER — Ambulatory Visit (INDEPENDENT_AMBULATORY_CARE_PROVIDER_SITE_OTHER): Payer: Medicare Other

## 2022-03-09 ENCOUNTER — Ambulatory Visit: Payer: Medicare Other

## 2022-03-09 VITALS — BP 120/60 | HR 76 | Temp 98.7°F | Ht 71.0 in | Wt 185.7 lb

## 2022-03-09 DIAGNOSIS — Z0000 Encounter for general adult medical examination without abnormal findings: Secondary | ICD-10-CM

## 2022-03-09 NOTE — Progress Notes (Signed)
Subjective:   Jay Robertson. is a 66 y.o. male who presents for Medicare Annual/Subsequent preventive examination.  Review of Systems       Objective:    Today's Vitals   03/09/22 1447  BP: 120/60  Pulse: 76  Temp: 98.7 F (37.1 C)  TempSrc: Oral  SpO2: 96%  Weight: 185 lb 11.2 oz (84.2 kg)  Height: '5\' 11"'$  (1.803 m)   Body mass index is 25.9 kg/m.  Advanced Directives 03/09/2022 02/05/2022 08/02/2021 01/30/2021 01/29/2021 02/12/2019 01/09/2015  Does Patient Have a Medical Advance Directive? Yes Yes No No No No No  Type of Paramedic of [place];Living will - - - - - -  Does patient want to make changes to medical advance directive? No - Patient declined No - Patient declined - - - - -  Copy of [place] in Chart? No - copy requested - - - - - -  Would patient like information on creating a medical advance directive? - - - Yes (Inpatient - patient requests chaplain consult to create a medical advance directive) No - Patient declined No - Patient declined No - patient declined information    Current Medications (verified) Outpatient Encounter Medications as of 03/09/2022  Medication Sig   benazepril (LOTENSIN) 20 MG tablet Take 1 tablet (20 mg total) by mouth daily.   Fexofenadine-Pseudoephedrine (ALLEGRA-D 24 HOUR PO) Take 1 tablet by mouth as needed.   omeprazole (PRILOSEC) 20 MG capsule Take 1 capsule (20 mg total) by mouth daily. Take 30 minutes before a meal   sildenafil (VIAGRA) 100 MG tablet Take 100 mg by mouth daily as needed for erectile dysfunction.   spironolactone (ALDACTONE) 25 MG tablet Take 1 tablet (25 mg total) by mouth daily.   tamsulosin (FLOMAX) 0.4 MG CAPS capsule Take 1 capsule (0.4 mg total) by mouth daily.   triamcinolone cream (KENALOG) 0.1 % APPLY TOPICALLY TWICE A DAY   No facility-administered encounter medications on file as of 03/09/2022.    Allergies (verified) Doxycycline   History: Past Medical History:   Diagnosis Date   ALLERGIC RHINITIS 10/24/2007   Blood transfusion without reported diagnosis 01/2021   Gastric ulcer    hx of gastric ulcer   GI bleed 01/2021   H. pylori infection    treated   Heart murmur    many years ago- no issues in the 1990's   HYPERLIPIDEMIA 10/16/2010   HYPERTENSION 07/25/2007   Past Surgical History:  Procedure Laterality Date   APPENDECTOMY     BIOPSY  02/01/2021   Procedure: BIOPSY;  Surgeon: Irving Copas., MD;  Location: [facility] ENDOSCOPY;  Service: Gastroenterology;;   CLAVICLE SURGERY     possibly as a child? unknown if surgery- broke collarbone   COLONOSCOPY WITH PROPOFOL N/A 02/01/2021   Procedure: COLONOSCOPY WITH PROPOFOL;  Surgeon: Irving Copas., MD;  Location: [facility];  Service: Gastroenterology;  Laterality: N/A;   ESOPHAGOGASTRODUODENOSCOPY (EGD) WITH PROPOFOL N/A 02/01/2021   Procedure: ESOPHAGOGASTRODUODENOSCOPY (EGD) WITH PROPOFOL;  Surgeon: Rush Landmark Telford Nab., MD;  Location: [facility];  Service: Gastroenterology;  Laterality: N/A;   GIVENS CAPSULE STUDY N/A 02/03/2021   Procedure: GIVENS CAPSULE STUDY;  Surgeon: Yetta Flock, MD;  Location: [facility];  Service: Gastroenterology;  Laterality: N/A;   NASAL SEPTUM SURGERY     septal deviation, turbinate hypertrophy and obstruction   Family History  Problem Relation Age of Onset   Hypertension Mother    Diabetes Brother  Colon cancer Neg Hx    Esophageal cancer Neg Hx    Pancreatic cancer Neg Hx    Stomach cancer Neg Hx    Liver disease Neg Hx    Hemochromatosis Neg Hx    Rectal cancer Neg Hx    Colon polyps Neg Hx    Social History   Socioeconomic History   Marital status: Single    Spouse name: Not on file   Number of children: Not on file   Years of education: Not on file   Highest education level: Not on file  Occupational History   Not on file  Tobacco Use   Smoking status: Never   Smokeless tobacco: Never   Tobacco comments:    pt  notes he may have had 3-4 cigars yearly  Vaping Use   Vaping Use: Never used  Substance and Sexual Activity   Alcohol use: Yes    Comment: occ   Drug use: No   Sexual activity: Not on file  Other Topics Concern   Not on file  Social History Narrative   Not on file   Social Determinants of Health   Financial Resource Strain: Low Risk    Difficulty of Paying Living Expenses: Not hard at all  Food Insecurity: No Food Insecurity   Worried About Charity fundraiser in the Last Year: Never true   Ran Out of Food in the Last Year: Never true  Transportation Needs: No Transportation Needs   Lack of Transportation (Medical): No   Lack of Transportation (Non-Medical): No  Physical Activity: Insufficiently Active   Days of Exercise per Week: 4 days   Minutes of Exercise per Session: 20 min  Stress: No Stress Concern Present   Feeling of Stress : Not at all  Social Connections: Moderately Integrated   Frequency of Communication with Friends and Family: More than three times a week   Frequency of Social Gatherings with Friends and Family: More than three times a week   Attends Religious Services: More than 4 times per year   Active Member of [employer] or Organizations: Yes   Attends Music therapist: More than 4 times per year   Marital Status: Never married    Tobacco Counseling Counseling given: Not Answered Tobacco comments: pt notes he may have had 3-4 cigars yearly   Clinical Intake:  Pre-visit preparation completed: No Diabetic?  No  Activities of Daily Living In your present state of health, do you have any difficulty performing the following activities: 03/09/2022  Hearing? N  Vision? N  Difficulty concentrating or making decisions? N  Walking or climbing stairs? N  Dressing or bathing? N  Doing errands, shopping? N  Preparing Food and eating ? N  Using the Toilet? N  In the past six months, have you accidently leaked urine? Y  Comment Wears depends  Do  you have problems with loss of bowel control? Y  Comment Wears depends  Managing your Medications? N  Managing your Finances? N  Housekeeping or managing your Housekeeping? N  Some recent data might be hidden    Patient Care Team: Billie Ruddy, MD as PCP - General (Family Medicine)  Indicate any recent Medical Services you may have received from other than [facility] providers in the past year (date may be approximate).     Assessment:   This is a routine wellness examination for [employer].  Hearing/Vision screen Hearing Screening - Comments:: No difficulty hearing Vision Screening - Comments:: Wears reading  glasses. Followed by Dr Alois Cliche  Dietary issues and exercise activities discussed: Exercise limited by: None identified   Goals Addressed               This Visit's Progress     Increase physical activity (pt-stated)        Maintain current good health.       Depression Screen PHQ 2/9 Scores 03/09/2022 02/04/2022 12/05/2021 02/19/2021 01/05/2014  PHQ - 2 Score 0 0 0 0 0  PHQ- 9 Score - - 1 - -    Fall Risk Fall Risk  03/09/2022 02/04/2022 12/05/2021 02/19/2021 01/05/2014  Falls in the past year? 1 0 0 1 No  Number falls in past yr: 0 0 - 0 -  Injury with Fall? 1 0 - 1 -  Comment Knee injury followed by ER Medical attention - - - -  Risk for fall due to : Impaired balance/gait - - History of fall(s) -  Follow up - - - Falls evaluation completed -    FALL RISK PREVENTION PERTAINING TO THE HOME:  Any stairs in or around the home? Yes  If so, are there any without handrails? No  Home free of loose throw rugs in walkways, pet beds, electrical cords, etc? Yes  Adequate lighting in your home to reduce risk of falls? Yes   ASSISTIVE DEVICES UTILIZED TO PREVENT FALLS:  Life alert? No  Use of a cane, walker or w/c? No  Grab bars in the bathroom? No  Shower chair or bench in shower? No  Elevated toilet seat or a handicapped toilet? No   TIMED UP AND GO:  Was the test  performed? Yes .  Length of time to ambulate 10 feet: 5 sec.   Gait steady and fast without use of assistive device  Cognitive Function:     6CIT Screen 03/09/2022  What Year? 0 points  What month? 0 points  What time? 0 points  Count back from 20 0 points  Months in reverse 0 points  Repeat phrase 0 points  Total Score 0    Immunizations Immunization History  Administered Date(s) Administered   PFIZER Comirnaty([place] Top)Covid-19 Tri-Sucrose Vaccine 04/10/2021   PFIZER(Purple Top)SARS-COV-2 Vaccination 03/01/2020, 04/02/2020, 10/14/2020, 04/10/2021   Pfizer Covid-19 Vaccine Bivalent Booster 25yr & up 09/15/2021   Td 07/04/2002   Tdap 11/29/2013    TDAP status: Up to date  Flu Vaccine status: Declined, Education has been provided regarding the importance of this vaccine but patient still declined. Advised may receive this vaccine at local pharmacy or Health Dept. Aware to provide a copy of the vaccination record if obtained from local pharmacy or Health Dept. Verbalized acceptance and understanding.  Pneumococcal vaccine status: Declined,  Education has been provided regarding the importance of this vaccine but patient still declined. Advised may receive this vaccine at local pharmacy or Health Dept. Aware to provide a copy of the vaccination record if obtained from local pharmacy or Health Dept. Verbalized acceptance and understanding.   Covid-19 vaccine status: Completed vaccines  Qualifies for Shingles Vaccine? Yes   Zostavax completed No   Shingrix Completed?: No.    Education has been provided regarding the importance of this vaccine. Patient has been advised to call insurance company to determine out of pocket expense if they have not yet received this vaccine. Advised may also receive vaccine at local pharmacy or Health Dept. Verbalized acceptance and understanding.  Screening Tests Health Maintenance  Topic Date Due   INFLUENZA VACCINE  03/27/2022 (Originally  07/28/2021)   Zoster Vaccines- Shingrix (1 of 2) 06/09/2022 (Originally 01/01/2006)   Pneumonia Vaccine 42+ Years old (1 - PCV) 03/10/2023 (Originally 01/01/2021)   Hepatitis C Screening  03/10/2023 (Originally 01/01/1974)   TETANUS/TDAP  11/30/2023   COLONOSCOPY (Pts 45-91yr Insurance coverage will need to be confirmed)  02/17/2025   COVID-19 Vaccine  Completed   HPV VACCINES  Aged Out    Health Maintenance  There are no preventive care reminders to display for this patient.   Colorectal cancer screening: Type of screening: Colonoscopy. Completed 02/17/22. Repeat every 3 years  Lung Cancer Screening: (Low Dose CT Chest recommended if Age 66-80years, 30 pack-year currently smoking OR have quit w/in 15years.) does not qualify.     Additional Screening:  Hepatitis C Screening: does qualify; Completed Patient deferred  Vision Screening: Recommended annual ophthalmology exams for early detection of glaucoma and other disorders of the eye. Is the patient up to date with their annual eye exam?  Yes  Who is the provider or what is the name of the office in which the patient attends annual eye exams? Dr PAlois ClicheIf pt is not established with a provider, would they like to be referred to a provider to establish care? No .   Dental Screening: Recommended annual dental exams for proper oral hygiene  Community Resource Referral / Chronic Care Management:  CRR required this visit?  No   CCM required this visit?  No      Plan:     I have personally reviewed and noted the following in the patients chart:   Medical and social history Use of alcohol, tobacco or illicit drugs  Current medications and supplements including opioid prescriptions. Patient is not currently taking opioid prescriptions. Functional ability and status Nutritional status Physical activity Advanced directives List of other physicians Hospitalizations, surgeries, and ER visits in previous 12  months Vitals Screenings to include cognitive, depression, and falls Referrals and appointments  In addition, I have reviewed and discussed with patient certain preventive protocols, quality metrics, and best practice recommendations. A written personalized care plan for preventive services as well as general preventive health recommendations were provided to patient.     BCriselda Peaches LPN   33/15/4008  Nurse Notes: Patient request f/u with concerns of occasional sharp pains in back of head.

## 2022-03-09 NOTE — Patient Instructions (Addendum)
Jay Robertson , Thank you for taking time to come for your Medicare Wellness Visit. I appreciate your ongoing commitment to your health goals. Please review the following plan we discussed and let me know if I can assist you in the future.   These are the goals we discussed:  Goals       Increase physical activity (pt-stated)      Maintain current good health.        This is a list of the screening recommended for you and due dates:  Health Maintenance  Topic Date Due   Flu Shot  03/27/2022*   Zoster (Shingles) Vaccine (1 of 2) 06/09/2022*   Pneumonia Vaccine (1 - PCV) 03/10/2023*   Hepatitis C Screening: USPSTF Recommendation to screen - Ages 18-79 yo.  03/10/2023*   Tetanus Vaccine  11/30/2023   Colon Cancer Screening  02/17/2025   COVID-19 Vaccine  Completed   HPV Vaccine  Aged Out  *Topic was postponed. The date shown is not the original due date.    Advanced directives: Yes Copies on file  Conditions/risks identified: None  Next appointment: Follow up in one year for your annual wellness visit.   Preventive Care 66 Years and Older, Male Preventive care refers to lifestyle choices and visits with your health care provider that can promote health and wellness. What does preventive care include? A yearly physical exam. This is also called an annual well check. Dental exams once or twice a year. Routine eye exams. Ask your health care provider how often you should have your eyes checked. Personal lifestyle choices, including: Daily care of your teeth and gums. Regular physical activity. Eating a healthy diet. Avoiding tobacco and drug use. Limiting alcohol use. Practicing safe sex. Taking low doses of aspirin every day. Taking vitamin and mineral supplements as recommended by your health care provider. What happens during an annual well check? The services and screenings done by your health care provider during your annual well check will depend on your age, overall  health, lifestyle risk factors, and family history of disease. Counseling  Your health care provider may ask you questions about your: Alcohol use. Tobacco use. Drug use. Emotional well-being. Home and relationship well-being. Sexual activity. Eating habits. History of falls. Memory and ability to understand (cognition). Work and work Statistician. Screening  You may have the following tests or measurements: Height, weight, and BMI. Blood pressure. Lipid and cholesterol levels. These may be checked every 5 years, or more frequently if you are over 50 years old. Skin check. Lung cancer screening. You may have this screening every year starting at age 66 if you have a 30-pack-year history of smoking and currently smoke or have quit within the past 15 years. Fecal occult blood test (FOBT) of the stool. You may have this test every year starting at age 66. Flexible sigmoidoscopy or colonoscopy. You may have a sigmoidoscopy every 5 years or a colonoscopy every 10 years starting at age 66. Prostate cancer screening. Recommendations will vary depending on your family history and other risks. Hepatitis C blood test. Hepatitis B blood test. Sexually transmitted disease (STD) testing. Diabetes screening. This is done by checking your blood sugar (glucose) after you have not eaten for a while (fasting). You may have this done every 1-3 years. Abdominal aortic aneurysm (AAA) screening. You may need this if you are a current or former smoker. Osteoporosis. You may be screened starting at age 66 if you are at high risk. Talk with your  health care provider about your test results, treatment options, and if necessary, the need for more tests. Vaccines  Your health care provider may recommend certain vaccines, such as: Influenza vaccine. This is recommended every year. Tetanus, diphtheria, and acellular pertussis (Tdap, Td) vaccine. You may need a Td booster every 10 years. Zoster vaccine. You may  need this after age 66. Pneumococcal 13-valent conjugate (PCV13) vaccine. One dose is recommended after age 66. Pneumococcal polysaccharide (PPSV23) vaccine. One dose is recommended after age 66. Talk to your health care provider about which screenings and vaccines you need and how often you need them. This information is not intended to replace advice given to you by your health care provider. Make sure you discuss any questions you have with your health care provider. Document Released: 01/10/2016 Document Revised: 09/02/2016 Document Reviewed: 10/15/2015 Elsevier Interactive Patient Education  2017 [place] Prevention in the Home Falls can cause injuries. They can happen to people of all ages. There are many things you can do to make your home safe and to help prevent falls. What can I do on the outside of my home? Regularly fix the edges of walkways and driveways and fix any cracks. Remove anything that might make you trip as you walk through a door, such as a raised step or threshold. Trim any bushes or trees on the path to your home. Use bright outdoor lighting. Clear any walking paths of anything that might make someone trip, such as rocks or tools. Regularly check to see if handrails are loose or broken. Make sure that both sides of any steps have handrails. Any raised decks and porches should have guardrails on the edges. Have any leaves, snow, or ice cleared regularly. Use sand or salt on walking paths during winter. Clean up any spills in your garage right away. This includes oil or grease spills. What can I do in the bathroom? Use night lights. Install grab bars by the toilet and in the tub and shower. Do not use towel bars as grab bars. Use non-skid mats or decals in the tub or shower. If you need to sit down in the shower, use a plastic, non-slip stool. Keep the floor dry. Clean up any water that spills on the floor as soon as it happens. Remove soap buildup in  the tub or shower regularly. Attach bath mats securely with double-sided non-slip rug tape. Do not have throw rugs and other things on the floor that can make you trip. What can I do in the bedroom? Use night lights. Make sure that you have a light by your bed that is easy to reach. Do not use any sheets or blankets that are too big for your bed. They should not hang down onto the floor. Have a firm chair that has side arms. You can use this for support while you get dressed. Do not have throw rugs and other things on the floor that can make you trip. What can I do in the kitchen? Clean up any spills right away. Avoid walking on wet floors. Keep items that you use a lot in easy-to-reach places. If you need to reach something above you, use a strong step stool that has a grab bar. Keep electrical cords out of the way. Do not use floor polish or wax that makes floors slippery. If you must use wax, use non-skid floor wax. Do not have throw rugs and other things on the floor that can make you trip. What  can I do with my stairs? Do not leave any items on the stairs. Make sure that there are handrails on both sides of the stairs and use them. Fix handrails that are broken or loose. Make sure that handrails are as long as the stairways. Check any carpeting to make sure that it is firmly attached to the stairs. Fix any carpet that is loose or worn. Avoid having throw rugs at the top or bottom of the stairs. If you do have throw rugs, attach them to the floor with carpet tape. Make sure that you have a light switch at the top of the stairs and the bottom of the stairs. If you do not have them, ask someone to add them for you. What else can I do to help prevent falls? Wear shoes that: Do not have high heels. Have rubber bottoms. Are comfortable and fit you well. Are closed at the toe. Do not wear sandals. If you use a stepladder: Make sure that it is fully opened. Do not climb a closed  stepladder. Make sure that both sides of the stepladder are locked into place. Ask someone to hold it for you, if possible. Clearly X[place] and make sure that you can see: Any grab bars or handrails. First and last steps. Where the edge of each step is. Use tools that help you move around (mobility aids) if they are needed. These include: Canes. Walkers. Scooters. Crutches. Turn on the lights when you go into a dark area. Replace any light bulbs as soon as they burn out. Set up your furniture so you have a clear path. Avoid moving your furniture around. If any of your floors are uneven, fix them. If there are any pets around you, be aware of where they are. Review your medicines with your doctor. Some medicines can make you feel dizzy. This can increase your chance of falling. Ask your doctor what other things that you can do to help prevent falls. This information is not intended to replace advice given to you by your health care provider. Make sure you discuss any questions you have with your health care provider. Document Released: 10/10/2009 Document Revised: 05/21/2016 Document Reviewed: 01/18/2015 Elsevier Interactive Patient Education  2017 [employer].

## 2022-04-18 ENCOUNTER — Other Ambulatory Visit: Payer: Self-pay | Admitting: Gastroenterology

## 2022-04-18 ENCOUNTER — Other Ambulatory Visit: Payer: Self-pay | Admitting: Family Medicine

## 2022-04-18 DIAGNOSIS — N401 Enlarged prostate with lower urinary tract symptoms: Secondary | ICD-10-CM

## 2022-05-10 ENCOUNTER — Other Ambulatory Visit: Payer: Self-pay | Admitting: Family Medicine

## 2022-05-10 DIAGNOSIS — N401 Enlarged prostate with lower urinary tract symptoms: Secondary | ICD-10-CM

## 2022-06-12 ENCOUNTER — Ambulatory Visit (INDEPENDENT_AMBULATORY_CARE_PROVIDER_SITE_OTHER): Payer: Medicare Other | Admitting: Family Medicine

## 2022-06-12 VITALS — BP 122/88 | HR 64 | Temp 97.9°F | Wt 180.4 lb

## 2022-06-12 DIAGNOSIS — Z87448 Personal history of other diseases of urinary system: Secondary | ICD-10-CM

## 2022-06-12 DIAGNOSIS — R634 Abnormal weight loss: Secondary | ICD-10-CM

## 2022-06-12 DIAGNOSIS — I10 Essential (primary) hypertension: Secondary | ICD-10-CM | POA: Diagnosis not present

## 2022-06-12 DIAGNOSIS — M545 Low back pain, unspecified: Secondary | ICD-10-CM

## 2022-06-12 LAB — POCT URINALYSIS DIPSTICK
Bilirubin, UA: NEGATIVE
Blood, UA: NEGATIVE
Glucose, UA: NEGATIVE
Ketones, UA: NEGATIVE
Leukocytes, UA: NEGATIVE
Nitrite, UA: NEGATIVE
Protein, UA: NEGATIVE
Spec Grav, UA: 1.025 (ref 1.010–1.025)
Urobilinogen, UA: 0.2 E.U./dL
pH, UA: 6 (ref 5.0–8.0)

## 2022-06-12 NOTE — Progress Notes (Signed)
Subjective:    Patient ID: Jay Robertson., male    DOB: [birth year], 66 y.o.   MRN: [id number]  Chief Complaint  Patient presents with   Follow-up    HPI Patient was seen today for follow-up.  Patient states he was advised to follow-up on history of hematuria during work physical.  Patient denies gross hematuria, dysuria, suprapubic pain.  Patient has history of BPH.  PSA normal on 02/04/2022.  Patient states he stopped checking BP at home as numbers were up and down.  Patient notes intermittent left-sided low back pain.  Inquires if it is 2/2 his mattress.  Stretching helps with the back pain.  Pt notes 5 lb wt loss in 2 wks.  States hasn't felt like cooking at home so eating out.  Pt states he has been eating less as the food has not been good.  Pt started working part-time at [employer].  Past Medical History:  Diagnosis Date   ALLERGIC RHINITIS 10/24/2007   Blood transfusion without reported diagnosis 01/2021   Gastric ulcer    hx of gastric ulcer   GI bleed 01/2021   H. pylori infection    treated   Heart murmur    many years ago- no issues in the 1990's   HYPERLIPIDEMIA 10/16/2010   HYPERTENSION 07/25/2007    Allergies  Allergen Reactions   Doxycycline Other (See Comments)    Headaches    ROS General: Denies fever, chills, night sweats +changes in weight, changes in appetite  HEENT: Denies headaches, ear pain, changes in vision, rhinorrhea, sore throat CV: Denies CP, palpitations, SOB, orthopnea Pulm: Denies SOB, cough, wheezing GI: Denies abdominal pain, nausea, vomiting, diarrhea, constipation GU: Denies dysuria, hematuria, frequency Msk: Denies muscle cramps, joint pains  +L sided low back pain Neuro: Denies weakness, numbness, tingling Skin: Denies rashes, bruising Psych: Denies depression, anxiety, hallucinations     Objective:    Blood pressure 122/88, pulse 64, temperature 97.9 F (36.6 C), temperature source Oral, weight 180 lb  6.4 oz (81.8 kg), SpO2 99 %.  Gen. Pleasant, well-nourished, in no distress, normal affect   HEENT: [place]/AT, face symmetric, conjunctiva clear, no scleral icterus, PERRLA, EOMI, nares patent without drainage Lungs: no accessory muscle use, CTAB, no wheezes or rales Cardiovascular: RRR, no m/r/g, no peripheral edema Abdomen: BS present, soft, NT/ND, no hepatosplenomegaly. Musculoskeletal: No TTP of midline spine or paraspinal muscles. No deformities, no cyanosis or clubbing, normal tone Neuro:  A&Ox3, CN II-XII intact, normal gait Skin:  Warm, no lesions/ rash  Wt Readings from Last 3 Encounters:  06/12/22 180 lb 6.4 oz (81.8 kg)  03/09/22 185 lb 11.2 oz (84.2 kg)  02/17/22 185 lb (83.9 kg)    Lab Results  Component Value Date   WBC 3.7 (L) 02/05/2022   HGB 14.1 02/05/2022   HCT 40.1 02/05/2022   PLT 235 02/05/2022   GLUCOSE 97 02/05/2022   CHOL 227 (H) 08/14/2021   TRIG 79.0 08/14/2021   HDL 45.90 08/14/2021   LDLDIRECT 163.0 10/09/2010   LDLCALC 166 (H) 08/14/2021   ALT 22 02/04/2022   AST 15 02/04/2022   NA 136 02/05/2022   K 3.9 02/05/2022   CL 103 02/05/2022   CREATININE 0.96 02/05/2022   BUN 16 02/05/2022   CO2 24 02/05/2022   TSH 1.83 08/14/2021   PSA 0.58 02/04/2022   INR 1.3 (H) 02/04/2021   HGBA1C 6.1 02/04/2022    Assessment/Plan:  Essential hypertension -controlled -continue lifestyle modifications -continue  benazepril 20 mg and spironolactone 25 mg daily  History of hematuria  -for continued hematuria f/u with Urology - Plan: POCT urinalysis dipstick  Left-sided low back pain without sciatica, unspecified chronicity -supportive care including stretching, topical analagesics, tylenol prn -PT for continued symptoms  Weight loss -Unintentional, however likely 2/2 decreased intake as food from restaurants has not been good per pt. -discussed monitoring wt closely for the next few wks.  Currently 180 lbs.  For continued wt loss 5lbs or more in the next  2 wks obtain labs and imaging.  F/u in 2-4 wks for wt loss  Grier Mitts, MD

## 2022-06-13 LAB — MICROALBUMIN / CREATININE URINE RATIO
Creatinine, Urine: 120 mg/dL (ref 20–320)
Microalb Creat Ratio: 3 mcg/mg creat (ref <30)
Microalb, Ur: 0.4 mg/dL

## 2022-06-28 ENCOUNTER — Encounter: Payer: Self-pay | Admitting: Family Medicine

## 2022-07-24 ENCOUNTER — Other Ambulatory Visit: Payer: Self-pay | Admitting: Family Medicine

## 2022-07-24 DIAGNOSIS — L739 Follicular disorder, unspecified: Secondary | ICD-10-CM

## 2022-08-05 ENCOUNTER — Other Ambulatory Visit: Payer: Self-pay | Admitting: Gastroenterology

## 2022-09-24 ENCOUNTER — Encounter: Payer: Self-pay | Admitting: Adult Health

## 2022-09-24 ENCOUNTER — Ambulatory Visit (INDEPENDENT_AMBULATORY_CARE_PROVIDER_SITE_OTHER): Payer: Medicare Other | Admitting: Adult Health

## 2022-09-24 VITALS — BP 120/80 | HR 81 | Temp 98.0°F | Wt 179.0 lb

## 2022-09-24 DIAGNOSIS — B353 Tinea pedis: Secondary | ICD-10-CM

## 2022-09-24 MED ORDER — TERBINAFINE HCL 250 MG PO TABS
250.0000 mg | ORAL_TABLET | Freq: Every day | ORAL | 0 refills | Status: DC
Start: 2022-09-24 — End: 2023-03-31

## 2022-09-24 NOTE — Progress Notes (Signed)
Subjective:    Patient ID: Jay Robertson., male    DOB: May 11, 1956, 66 y.o.   MRN: [id number]  HPI 66 year old male who  has a past medical history of ALLERGIC RHINITIS (10/24/2007), Blood transfusion without reported diagnosis (01/2021), Gastric ulcer, GI bleed (01/2021), H. pylori infection, Heart murmur, HYPERLIPIDEMIA (10/16/2010), and HYPERTENSION (07/25/2007).  He presents to the office today for an acute issue of one week of both feet peeling. At home he has been putting Vaseline on both of his feet does not feel as though this is helping much.  He does have fungal infection on the right great toe.  Has some itching but no pain related to the peeling.  He is not a diabetic.   Review of Systems See HPI   Past Medical History:  Diagnosis Date   ALLERGIC RHINITIS 10/24/2007   Blood transfusion without reported diagnosis 01/2021   Gastric ulcer    hx of gastric ulcer   GI bleed 01/2021   H. pylori infection    treated   Heart murmur    many years ago- no issues in the 1990's   HYPERLIPIDEMIA 10/16/2010   HYPERTENSION 07/25/2007    Social History   Socioeconomic History   Marital status: Single    Spouse name: Not on file   Number of children: Not on file   Years of education: Not on file   Highest education level: Not on file  Occupational History   Not on file  Tobacco Use   Smoking status: Never   Smokeless tobacco: Never   Tobacco comments:    pt notes he may have had 3-4 cigars yearly  Vaping Use   Vaping Use: Never used  Substance and Sexual Activity   Alcohol use: Yes    Comment: occ   Drug use: No   Sexual activity: Not on file  Other Topics Concern   Not on file  Social History Narrative   Not on file   Social Determinants of Health   Financial Resource Strain: Low Risk  (03/09/2022)   Overall Financial Resource Strain (CARDIA)    Difficulty of Paying Living Expenses: Not hard at all  Food Insecurity: No Food Insecurity (03/09/2022)   Hunger  Vital Sign    Worried About Running Out of Food in the Last Year: Never true    Ran Out of Food in the Last Year: Never true  Transportation Needs: No Transportation Needs (03/09/2022)   PRAPARE - Hydrologist (Medical): No    Lack of Transportation (Non-Medical): No  Physical Activity: Insufficiently Active (03/09/2022)   Exercise Vital Sign    Days of Exercise per Week: 4 days    Minutes of Exercise per Session: 20 min  Stress: No Stress Concern Present (03/09/2022)   [place]    Feeling of Stress : Not at all  Social Connections: Moderately Integrated (03/09/2022)   Social Connection and Isolation Panel [NHANES]    Frequency of Communication with Friends and Family: More than three times a week    Frequency of Social Gatherings with Friends and Family: More than three times a week    Attends Religious Services: More than 4 times per year    Active Member of [employer] or Organizations: Yes    Attends Music therapist: More than 4 times per year    Marital Status: Never married  Intimate Partner Violence: Not At Risk (  03/09/2022)   Humiliation, Afraid, Rape, and Kick questionnaire    Fear of Current or Ex-Partner: No    Emotionally Abused: No    Physically Abused: No    Sexually Abused: No    Past Surgical History:  Procedure Laterality Date   APPENDECTOMY     BIOPSY  02/01/2021   Procedure: BIOPSY;  Surgeon: Mansouraty, Telford Nab., MD;  Location: [facility] ENDOSCOPY;  Service: Gastroenterology;;   CLAVICLE SURGERY     possibly as a child? unknown if surgery- broke collarbone   COLONOSCOPY WITH PROPOFOL N/A 02/01/2021   Procedure: COLONOSCOPY WITH PROPOFOL;  Surgeon: Irving Copas., MD;  Location: [place];  Service: Gastroenterology;  Laterality: N/A;   ESOPHAGOGASTRODUODENOSCOPY (EGD) WITH PROPOFOL N/A 02/01/2021   Procedure: ESOPHAGOGASTRODUODENOSCOPY (EGD) WITH PROPOFOL;   Surgeon: Rush Landmark Telford Nab., MD;  Location: [place];  Service: Gastroenterology;  Laterality: N/A;   GIVENS CAPSULE STUDY N/A 02/03/2021   Procedure: GIVENS CAPSULE STUDY;  Surgeon: Yetta Flock, MD;  Location: [place];  Service: Gastroenterology;  Laterality: N/A;   NASAL SEPTUM SURGERY     septal deviation, turbinate hypertrophy and obstruction    Family History  Problem Relation Age of Onset   Hypertension Mother    Diabetes Brother    Colon cancer Neg Hx    Esophageal cancer Neg Hx    Pancreatic cancer Neg Hx    Stomach cancer Neg Hx    Liver disease Neg Hx    Hemochromatosis Neg Hx    Rectal cancer Neg Hx    Colon polyps Neg Hx     Allergies  Allergen Reactions   Doxycycline Other (See Comments)    Headaches    Current Outpatient Medications on File Prior to Visit  Medication Sig Dispense Refill   benazepril (LOTENSIN) 20 MG tablet Take 1 tablet (20 mg total) by mouth daily. 90 tablet 3   Fexofenadine-Pseudoephedrine (ALLEGRA-D 24 HOUR PO) Take 1 tablet by mouth as needed.     omeprazole (PRILOSEC) 20 MG capsule TAKE 1 CAPSULE BY MOUTH DAILY. TAKE 30 MINUTES BEFORE A MEAL 90 capsule 0   sildenafil (VIAGRA) 100 MG tablet Take 100 mg by mouth daily as needed for erectile dysfunction.     spironolactone (ALDACTONE) 25 MG tablet Take 1 tablet (25 mg total) by mouth daily. 90 tablet 3   tamsulosin (FLOMAX) 0.4 MG CAPS capsule TAKE 1 CAPSULE BY MOUTH EVERY DAY (Patient not taking: Reported on 06/12/2022) 90 capsule 1   triamcinolone cream (KENALOG) 0.1 % APPLY TO AFFECTED AREA TWICE A DAY 60 g 2   No current facility-administered medications on file prior to visit.    BP 120/80   Pulse 81   Temp 98 F (36.7 C)   Wt 179 lb (81.2 kg)   SpO2 98%   BMI 24.97 kg/m       Objective:   Physical Exam Vitals and nursing note reviewed.  Constitutional:      Appearance: Normal appearance.  Feet:     Right foot:     Skin integrity: Dry skin present.      Toenail Condition: Right toenails are abnormally thick. Fungal disease present.    Left foot:     Skin integrity: Dry skin present.     Comments: Significant healing and fungal disease noted throughout bilateral feet. Skin:    General: Skin is warm and dry.     Capillary Refill: Capillary refill takes less than 2 seconds.     Findings: Rash present.  Neurological:     General: No focal deficit present.     Mental Status: He is alert and oriented to person, place, and time.  Psychiatric:        Mood and Affect: Mood normal.        Behavior: Behavior normal.        Thought Content: Thought content normal.        Judgment: Judgment normal.       Assessment & Plan:  1. Tinea pedis of both feet - terbinafine (LAMISIL) 250 MG tablet; Take 1 tablet (250 mg total) by mouth daily.  Dispense: 30 tablet; Refill: 0 - Follow up with PCP if not improving in the next 1-2 weeks   Dorothyann Peng, NP

## 2022-10-23 ENCOUNTER — Ambulatory Visit (INDEPENDENT_AMBULATORY_CARE_PROVIDER_SITE_OTHER): Payer: Medicare Other | Admitting: Family Medicine

## 2022-10-23 ENCOUNTER — Encounter: Payer: Self-pay | Admitting: Family Medicine

## 2022-10-23 VITALS — BP 132/92 | HR 98 | Temp 98.7°F | Wt 177.6 lb

## 2022-10-23 DIAGNOSIS — I10 Essential (primary) hypertension: Secondary | ICD-10-CM

## 2022-10-23 DIAGNOSIS — L309 Dermatitis, unspecified: Secondary | ICD-10-CM | POA: Diagnosis not present

## 2022-10-23 NOTE — Progress Notes (Signed)
Subjective:    Patient ID: Jay Pippin., male    DOB: November 13, 1956, 66 y.o.   MRN: [id number]  Chief Complaint  Patient presents with   itching    Changed soap, caused itching wepping. Went back to sensitive soap and no more itching     HPI Patient was seen today for acute concern.  Patient endorses pruritus and whelps on skin after changing soaps. Typically uses Dove sensitive skin but the store was out so he purchased regular Dove.  Pt used product x 4 days.  Noticed symptoms each time he used the product.  Pt went back to [facility] sensitive skin and noticed improvement in symptoms.  Patient does not use any lotion or moisturizing products on skin after showering.    Patient inquires if changes in skin are normal with becoming older as he recently seen at the end of September for tinea pedis.  Pt states he has never had this before and was concerned about how he could have developed it.  Taking Lamisil 150 mg p.o.  Pt retired but staying busy.  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  Patient endorses getting about 4 hours of sleep each night as he is used to working 2 jobs.  States eyes are typically red in the morning.  Previously used Visine eyedrops with metazoline but they don't make them anymore.  Past Medical History:  Diagnosis Date   ALLERGIC RHINITIS 10/24/2007   Blood transfusion without reported diagnosis 01/2021   Gastric ulcer    hx of gastric ulcer   GI bleed 01/2021   H. pylori infection    treated   Heart murmur    many years ago- no issues in the 1990's   HYPERLIPIDEMIA 10/16/2010   HYPERTENSION 07/25/2007    Allergies  Allergen Reactions   Doxycycline Other (See Comments)    Headaches    ROS General: Denies fever, chills, night sweats, changes in weight, changes in appetite HEENT: Denies headaches, ear pain, changes in vision, rhinorrhea, sore throat CV: Denies CP, palpitations, SOB, orthopnea Pulm: Denies SOB, cough, wheezing GI: Denies abdominal  pain, nausea, vomiting, diarrhea, constipation GU: Denies dysuria, hematuria, frequency Msk: Denies muscle cramps, joint pains Neuro: Denies weakness, numbness, tingling Skin: Denies rashes, bruising + pruritus, whelps Psych: Denies depression, anxiety, hallucinations     Objective:    Blood pressure (!) 132/92, pulse 98, temperature 98.7 F (37.1 C), temperature source Oral, weight 177 lb 9.6 oz (80.6 kg), SpO2 94 %.  Gen. Pleasant, well-nourished, in no distress, normal affect   HEENT: [place]/AT, face symmetric, conjunctiva clear, no scleral icterus, PERRLA, EOMI, nares patent without drainage, Lungs: no accessory muscle use, Cardiovascular: RRR, no peripheral edema Neuro:  A&Ox3, CN II-XII intact, normal gait Skin:  Warm, intact.  Extremely dry appearing skin with excoriation marks and an area of faint erythema right medial upper arm.   Wt Readings from Last 3 Encounters:  10/23/22 177 lb 9.6 oz (80.6 kg)  09/24/22 179 lb (81.2 kg)  06/12/22 180 lb 6.4 oz (81.8 kg)    Lab Results  Component Value Date   WBC 3.7 (L) 02/05/2022   HGB 14.1 02/05/2022   HCT 40.1 02/05/2022   PLT 235 02/05/2022   GLUCOSE 97 02/05/2022   CHOL 227 (H) 08/14/2021   TRIG 79.0 08/14/2021   HDL 45.90 08/14/2021   LDLDIRECT 163.0 10/09/2010   LDLCALC 166 (H) 08/14/2021   ALT 22 02/04/2022   AST 15 02/04/2022   NA  136 02/05/2022   K 3.9 02/05/2022   CL 103 02/05/2022   CREATININE 0.96 02/05/2022   BUN 16 02/05/2022   CO2 24 02/05/2022   TSH 1.83 08/14/2021   PSA 0.58 02/04/2022   INR 1.3 (H) 02/04/2021   HGBA1C 6.1 02/04/2022   MICROALBUR 0.4 06/12/2022    Assessment/Plan:  Dermatitis -Resolved -Continue to use products for sensitive skin -Discussed applying a gentle moisturizer to skin after showering. -Continue to monitor  Essential hypertension -Elevated -Continue benazepril 20 mg daily and spironolactone 25 mg daily  F/u prn.  Has AWV next wk  Grier Mitts, MD

## 2022-10-27 ENCOUNTER — Other Ambulatory Visit: Payer: Self-pay | Admitting: Gastroenterology

## 2022-11-02 ENCOUNTER — Encounter: Payer: Medicare Other | Admitting: Family Medicine

## 2022-12-23 ENCOUNTER — Telehealth: Payer: Self-pay

## 2022-12-23 NOTE — Telephone Encounter (Signed)
Caller states he would like to make an appointment. He has of dizzineess.  12/23/2022 9:46:22 AM Attempt made - message left Renne Crigler, RN, Sherlie Ban  12/23/2022 9:57:50 AM Send To RN Personal Renne Crigler, RN, Sherlie Ban  12/23/2022 9:58:14 AM Send To RN Personal Renne Crigler, RN, Sherlie Ban  12/23/2022 10:00:42 AM Attempt made - message left De[place], RN, Levada Dy  12/23/2022 10:07:37 AM FINAL ATTEMPT MADE - message left Yes Deaton, RN, Levada Dy  12/23/22 1303 - LVM for pt to return call. If patient returns call, please schedule appt for him if he's requesting.

## 2023-01-08 ENCOUNTER — Telehealth: Payer: Self-pay | Admitting: *Deleted

## 2023-01-08 ENCOUNTER — Encounter: Payer: Self-pay | Admitting: *Deleted

## 2023-01-08 NOTE — Patient Instructions (Signed)
Visit Information  Thank you for taking time to visit with me today. Please don't hesitate to contact me if I can be of assistance to you.   Following are the goals we discussed today:   Goals Addressed             This Visit's Progress    COMPLETED: care coordination activity       Care Coordination Interventions: Reviewed medications with patient and discussed adherence with no needed refills Reviewed scheduled/upcoming provider appointments including sufficient transportation source Screening for signs and symptoms of depression related to chronic disease state  Assessed social determinant of health barriers Educated on care management services with no needs presented at this time.          Please call the care guide team at [phone] if you need to cancel or reschedule your appointment.   If you are experiencing a Mental Health or [place] or need someone to talk to, please call the Suicide and Crisis Lifeline: 988  Patient verbalizes understanding of instructions and care plan provided today and agrees to view in [place]. Active MyChart status and patient understanding of how to access instructions and care plan via MyChart confirmed with patient.     No further follow up required: No needs   Raina Mina, RN Care Management Coordinator [place] Office [phone]

## 2023-01-08 NOTE — Patient Outreach (Signed)
  Care Coordination   Initial Visit Note   01/08/2023 Name: JOHNMICHAEL MELHORN Sr. MRN: [id number] DOB: Feb 21, 1956  [name] Sr. is a 67 y.o. year old male who sees Billie Ruddy, MD for primary care. I spoke with  [name] Sr. by phone today.  What matters to the patients health and wellness today?  No needs    Goals Addressed             This Visit's Progress    COMPLETED: care coordination activity       Care Coordination Interventions: Reviewed medications with patient and discussed adherence with no needed refills Reviewed scheduled/upcoming provider appointments including sufficient transportation source Screening for signs and symptoms of depression related to chronic disease state  Assessed social determinant of health barriers Educated on care management services with no needs presented at this time.          SDOH assessments and interventions completed:  Yes  SDOH Interventions Today    Flowsheet Row Most Recent Value  SDOH Interventions   Food Insecurity Interventions Intervention Not Indicated  Housing Interventions Intervention Not Indicated  Transportation Interventions Intervention Not Indicated  Utilities Interventions Intervention Not Indicated        Care Coordination Interventions:  Yes, provided   Follow up plan: No further intervention required.   Encounter Outcome:  Pt. Visit Completed   Raina Mina, RN Care Management Coordinator [place] Office [phone]

## 2023-01-13 ENCOUNTER — Ambulatory Visit (INDEPENDENT_AMBULATORY_CARE_PROVIDER_SITE_OTHER): Payer: Medicare Other | Admitting: Family Medicine

## 2023-01-13 ENCOUNTER — Encounter: Payer: Self-pay | Admitting: Family Medicine

## 2023-01-13 VITALS — BP 118/88 | HR 65 | Temp 97.9°F | Ht 71.0 in | Wt 181.6 lb

## 2023-01-13 DIAGNOSIS — E782 Mixed hyperlipidemia: Secondary | ICD-10-CM

## 2023-01-13 DIAGNOSIS — I10 Essential (primary) hypertension: Secondary | ICD-10-CM

## 2023-01-13 LAB — LIPID PANEL
Cholesterol: 201 mg/dL — ABNORMAL HIGH (ref 0–200)
HDL: 40.6 mg/dL (ref >39.00)
LDL Cholesterol: 146 mg/dL — ABNORMAL HIGH (ref 0–99)
NonHDL: 160.78
Total CHOL/HDL Ratio: 5
Triglycerides: 75 mg/dL (ref 0.0–149.0)
VLDL: 15 mg/dL (ref 0.0–40.0)

## 2023-01-13 LAB — COMPREHENSIVE METABOLIC PANEL
ALT: 18 U/L (ref 0–53)
AST: 18 U/L (ref 0–37)
Albumin: 4.3 g/dL (ref 3.5–5.2)
Alkaline Phosphatase: 62 U/L (ref 39–117)
BUN: 15 mg/dL (ref 6–23)
CO2: 28 mEq/L (ref 19–32)
Calcium: 9.5 mg/dL (ref 8.4–10.5)
Chloride: 99 mEq/L (ref 96–112)
Creatinine, Ser: 0.88 mg/dL (ref 0.40–1.50)
GFR: 89.28 mL/min (ref >60.00)
Glucose, Bld: 84 mg/dL (ref 70–99)
Potassium: 4.2 mEq/L (ref 3.5–5.1)
Sodium: 135 mEq/L (ref 135–145)
Total Bilirubin: 0.9 mg/dL (ref 0.2–1.2)
Total Protein: 7.6 g/dL (ref 6.0–8.3)

## 2023-01-13 NOTE — Progress Notes (Signed)
Subjective:    Patient ID: Jay Robertson., male    DOB: 1956/01/09, 67 y.o.   MRN: [id number]  Chief Complaint  Patient presents with   Medical Management of Chronic Issues    Following up for Cholesterol and BP.     HPI Patient was seen today for f/u on cholesterol.  Fasting today.  Pt states he fell off a little by adding salt to his food and eating potato chips.  Had an episode of palpitations a few wks ago.   Wants R ear checked as he noticed some blood on qtip.  States may have stuck it in ear too far. Past Medical History:  Diagnosis Date   ALLERGIC RHINITIS 10/24/2007   Blood transfusion without reported diagnosis 01/2021   Gastric ulcer    hx of gastric ulcer   GI bleed 01/2021   H. pylori infection    treated   Heart murmur    many years ago- no issues in the 1990's   HYPERLIPIDEMIA 10/16/2010   HYPERTENSION 07/25/2007    Allergies  Allergen Reactions   Doxycycline Other (See Comments)    Headaches    ROS General: Denies fever, chills, night sweats, changes in weight, changes in appetite HEENT: Denies headaches, ear pain, changes in vision, rhinorrhea, sore throat CV: Denies CP, palpitations, SOB, orthopnea Pulm: Denies SOB, cough, wheezing GI: Denies abdominal pain, nausea, vomiting, diarrhea, constipation GU: Denies dysuria, hematuria, frequency Msk: Denies muscle cramps, joint pains Neuro: Denies weakness, numbness, tingling Skin: Denies rashes, bruising Psych: Denies depression, anxiety, hallucinations     Objective:    Blood pressure 118/88, pulse 65, temperature 97.9 F (36.6 C), temperature source Oral, height '5\' 11"'$  (1.803 m), weight 181 lb 9.6 oz (82.4 kg), SpO2 97 %.  Gen. Pleasant, well-nourished, in no distress, normal affect   HEENT: [place]/AT, face symmetric, conjunctiva clear, no scleral icterus, PERRLA, EOMI, nares patent without drainage, pharynx without erythema or exudate.  TMs normal b/l. Lungs: no accessory muscle use, CTAB, no wheezes  or rales Cardiovascular: RRR, no m/r/g, no peripheral edema Neuro:  A&Ox3, CN II-XII intact, normal gait Skin:  Warm, no lesions/ rash   Wt Readings from Last 3 Encounters:  01/13/23 181 lb 9.6 oz (82.4 kg)  10/23/22 177 lb 9.6 oz (80.6 kg)  09/24/22 179 lb (81.2 kg)    Lab Results  Component Value Date   WBC 3.7 (L) 02/05/2022   HGB 14.1 02/05/2022   HCT 40.1 02/05/2022   PLT 235 02/05/2022   GLUCOSE 97 02/05/2022   CHOL 227 (H) 08/14/2021   TRIG 79.0 08/14/2021   HDL 45.90 08/14/2021   LDLDIRECT 163.0 10/09/2010   LDLCALC 166 (H) 08/14/2021   ALT 22 02/04/2022   AST 15 02/04/2022   NA 136 02/05/2022   K 3.9 02/05/2022   CL 103 02/05/2022   CREATININE 0.96 02/05/2022   BUN 16 02/05/2022   CO2 24 02/05/2022   TSH 1.83 08/14/2021   PSA 0.58 02/04/2022   INR 1.3 (H) 02/04/2021   HGBA1C 6.1 02/04/2022   MICROALBUR 0.4 06/12/2022    Assessment/Plan:  Mixed hyperlipidemia  -total cholesterol 227, LDL 166 on 08/14/21 -Lifestyle modifications.   -Further recommendations based on results. - Plan: Lipid panel  Essential hypertension -controlled -continue current meds -decrease sodium intake  - Plan: CMP  F/u prn  Grier Mitts, MD

## 2023-02-10 ENCOUNTER — Ambulatory Visit: Payer: Medicare Other | Admitting: Family Medicine

## 2023-02-12 ENCOUNTER — Other Ambulatory Visit: Payer: Self-pay | Admitting: Gastroenterology

## 2023-03-31 ENCOUNTER — Ambulatory Visit (INDEPENDENT_AMBULATORY_CARE_PROVIDER_SITE_OTHER): Payer: Medicare Other | Admitting: Family Medicine

## 2023-03-31 ENCOUNTER — Other Ambulatory Visit: Payer: Self-pay | Admitting: Family Medicine

## 2023-03-31 VITALS — BP 128/86 | HR 71 | Temp 98.0°F | Resp 16 | Wt 179.0 lb

## 2023-03-31 DIAGNOSIS — Z7689 Persons encountering health services in other specified circumstances: Secondary | ICD-10-CM

## 2023-03-31 DIAGNOSIS — R7303 Prediabetes: Secondary | ICD-10-CM | POA: Diagnosis not present

## 2023-03-31 DIAGNOSIS — I10 Essential (primary) hypertension: Secondary | ICD-10-CM

## 2023-03-31 LAB — POCT GLYCOSYLATED HEMOGLOBIN (HGB A1C): Hemoglobin A1C: 5.7 % — AB (ref 4.0–5.6)

## 2023-03-31 NOTE — Progress Notes (Signed)
Established Patient Office Visit   Subjective  Patient ID: Jay Parrales., male    DOB: Jan 17, 1956  Age: 67 y.o. MRN: [id number]  Chief Complaint  Patient presents with   Hypertension    Patient reports bp was elevated at dentist office    Patient is a 67 year old male with pmh sig for HTN, pre DM, GERD, ED who was seen for several concerns. Pt has been hearing a lot about DM and wanted to make sure he does not have it.  BS was normal 12/2022.    Pt notes bp was elevated dentist office for visit to have tooth extracted.  BP was 150/90 that day.  Patient taking his medication.  Patient endorses not being fond of going to the dentist.  Patient has not been checking BP at home.  Has monitor.  Patient also notes having "some aches and pains" that have since resolved.  Patient states he does not used to having these issues.  Patient also mentions having peeling and itching of foot in September 2023.  Was given Rx for terbinafine tabs by another provider in office.  Symptoms have since resolved.  Patient states he has never had athlete's foot before.  Hypertension    Patient Active Problem List   Diagnosis Date Noted   History of Helicobacter infection 03/20/2021   Hx of colonic polyps 03/20/2021   History of lower GI bleeding 03/20/2021   History of gastric ulcer 03/20/2021   Diverticulosis of large intestine with hemorrhage    GI bleed 01/30/2021   Acute lower GI bleeding 01/29/2021   Syncope 01/29/2021   Pure hypercholesterolemia 10/16/2010   Allergic rhinitis 10/24/2007   Essential hypertension 07/25/2007   Past Surgical History:  Procedure Laterality Date   APPENDECTOMY     BIOPSY  02/01/2021   Procedure: BIOPSY;  Surgeon: Rush Landmark Telford Nab., MD;  [facility] ENDOSCOPY;  Service: Gastroenterology;;   CLAVICLE SURGERY     possibly as a child? unknown if surgery- broke collarbone   COLONOSCOPY WITH PROPOFOL N/A 02/01/2021   Procedure: COLONOSCOPY WITH PROPOFOL;  Surgeon:  Irving Copas., MD;  Location: [place];  Service: Gastroenterology;  Laterality: N/A;   ESOPHAGOGASTRODUODENOSCOPY (EGD) WITH PROPOFOL N/A 02/01/2021   Procedure: ESOPHAGOGASTRODUODENOSCOPY (EGD) WITH PROPOFOL;  Surgeon: Rush Landmark Telford Nab., MD;  Location: [place];  Service: Gastroenterology;  Laterality: N/A;   GIVENS CAPSULE STUDY N/A 02/03/2021   Procedure: GIVENS CAPSULE STUDY;  Surgeon: Yetta Flock, MD;  Location: [place];  Service: Gastroenterology;  Laterality: N/A;   NASAL SEPTUM SURGERY     septal deviation, turbinate hypertrophy and obstruction   Social History   Tobacco Use   Smoking status: Never   Smokeless tobacco: Never   Tobacco comments:    pt notes he may have had 3-4 cigars yearly  Vaping Use   Vaping Use: Never used  Substance Use Topics   Alcohol use: Yes    Comment: occ   Drug use: No   Family History  Problem Relation Age of Onset   Hypertension Mother    Diabetes Brother    Colon cancer Neg Hx    Esophageal cancer Neg Hx    Pancreatic cancer Neg Hx    Stomach cancer Neg Hx    Liver disease Neg Hx    Hemochromatosis Neg Hx    Rectal cancer Neg Hx    Colon polyps Neg Hx    Allergies  Allergen Reactions   Doxycycline Other (See Comments)  Headaches      ROS Negative unless stated above    Objective:     BP 128/86 (BP Location: Left Arm, Patient Position: Sitting, Cuff Size: Small)   Pulse 71   Temp 98 F (36.7 C) (Oral)   Resp 16   Wt 179 lb (81.2 kg)   SpO2 93%   BMI 24.97 kg/m  BP Readings from Last 3 Encounters:  03/31/23 128/86  01/13/23 118/88  10/23/22 (!) 132/92   Wt Readings from Last 3 Encounters:  03/31/23 179 lb (81.2 kg)  01/13/23 181 lb 9.6 oz (82.4 kg)  10/23/22 177 lb 9.6 oz (80.6 kg)      Physical Exam Constitutional:      General: He is not in acute distress.    Appearance: Normal appearance.  HENT:     Head: Normocephalic and atraumatic.     Nose: Nose normal.      Mouth/Throat:     Mouth: Mucous membranes are moist.  Cardiovascular:     Rate and Rhythm: Normal rate and regular rhythm.     Heart sounds: Normal heart sounds. No murmur heard.    No gallop.  Pulmonary:     Effort: Pulmonary effort is normal. No respiratory distress.     Breath sounds: Normal breath sounds. No wheezing, rhonchi or rales.  Skin:    General: Skin is warm and dry.  Neurological:     Mental Status: He is alert and oriented to person, place, and time.      No results found for any visits on 03/31/23.    Assessment & Plan:  Patient concern regarding diabetes mellitus -Hemoglobin A1c 5.7% this visit. -     POCT glycosylated hemoglobin (Hb A1C)  Essential hypertension -Controlled -Continue current medications including benazepril 20 mg daily, spironolactone 25 mg daily -Lifestyle modifications encouraged -Patient encouraged to check BP at home and keep a log to bring with him to clinic  Prediabetes -Blood sugar 84 on 01/13/2023.  Hemoglobin A1c 6.1% on 02/04/2022 -Hemoglobin A1c 5.7% this visit. -Discussed importance of lifestyle modifications -     POCT glycosylated hemoglobin (Hb A1C)   Return if symptoms worsen or fail to improve.   Billie Ruddy, MD

## 2023-04-12 ENCOUNTER — Telehealth: Payer: Self-pay | Admitting: Family Medicine

## 2023-04-12 NOTE — Telephone Encounter (Signed)
Contacted Jay Apple Sr. to schedule their annual wellness visit. Appointment made for 04/16/23.  [name] AWV direct phone # [phone]

## 2023-04-26 ENCOUNTER — Other Ambulatory Visit: Payer: Self-pay | Admitting: Family Medicine

## 2023-04-26 DIAGNOSIS — I10 Essential (primary) hypertension: Secondary | ICD-10-CM

## 2023-05-05 ENCOUNTER — Telehealth: Payer: Self-pay | Admitting: Family Medicine

## 2023-05-05 NOTE — Telephone Encounter (Signed)
Called patient to schedule Medicare Annual Wellness Visit (AWV). Left message for patient to call back and schedule Medicare Annual Wellness Visit (AWV).  Last date of AWV: 03/09/22  Please schedule an appointment at any time with [facility] or [employer].  If any questions, please contact me at [phone].  Thank you ,  [name] AWV direct phone # [phone]

## 2023-05-06 ENCOUNTER — Telehealth: Payer: Self-pay | Admitting: Family Medicine

## 2023-05-06 NOTE — Telephone Encounter (Signed)
Contacted Jay Apple Sr. to schedule their annual wellness visit. Appointment made for 05/14/23.  [name] AWV direct phone # [phone]

## 2023-05-10 ENCOUNTER — Other Ambulatory Visit: Payer: Self-pay | Admitting: Gastroenterology

## 2023-05-10 NOTE — Telephone Encounter (Signed)
Patient needs follow up appt

## 2023-05-14 ENCOUNTER — Ambulatory Visit (INDEPENDENT_AMBULATORY_CARE_PROVIDER_SITE_OTHER): Payer: Medicare Other

## 2023-05-14 VITALS — BP 120/64 | HR 62 | Temp 97.8°F | Ht 71.0 in | Wt 177.7 lb

## 2023-05-14 DIAGNOSIS — Z0000 Encounter for general adult medical examination without abnormal findings: Secondary | ICD-10-CM

## 2023-05-14 NOTE — Patient Instructions (Addendum)
[name] , Thank you for taking time to come for your Medicare Wellness Visit. I appreciate your ongoing commitment to your health goals. Please review the following plan we discussed and let me know if I can assist you in the future.   These are the goals we discussed:  Goals       Increase physical activity (pt-stated)      Maintain current good health.      Make more money (pt-stated)        This is a list of the screening recommended for you and due dates:  Health Maintenance  Topic Date Due   Zoster (Shingles) Vaccine (1 of 2) 08/14/2023*   Pneumonia Vaccine (1 of 1 - PCV) 05/13/2024*   Hepatitis C Screening: USPSTF Recommendation to screen - Ages 18-79 yo.  05/13/2024*   Flu Shot  07/29/2023   DTaP/Tdap/Td vaccine (3 - Td or Tdap) 11/30/2023   Medicare Annual Wellness Visit  05/13/2024   Colon Cancer Screening  02/17/2025   COVID-19 Vaccine  Completed   HPV Vaccine  Aged Out  *Topic was postponed. The date shown is not the original due date.    Advanced directives: In Chart  Conditions/risks identified: None  Next appointment: Follow up in one year for your annual wellness visit.   Preventive Care 67 Years and Older, Male  Preventive care refers to lifestyle choices and visits with your health care provider that can promote health and wellness. What does preventive care include? A yearly physical exam. This is also called an annual well check. Dental exams once or twice a year. Routine eye exams. Ask your health care provider how often you should have your eyes checked. Personal lifestyle choices, including: Daily care of your teeth and gums. Regular physical activity. Eating a healthy diet. Avoiding tobacco and drug use. Limiting alcohol use. Practicing safe sex. Taking low doses of aspirin every day. Taking vitamin and mineral supplements as recommended by your health care provider. What happens during an annual well check? The services and screenings done  by your health care provider during your annual well check will depend on your age, overall health, lifestyle risk factors, and family history of disease. Counseling  Your health care provider may ask you questions about your: Alcohol use. Tobacco use. Drug use. Emotional well-being. Home and relationship well-being. Sexual activity. Eating habits. History of falls. Memory and ability to understand (cognition). Work and work Astronomer. Screening  You may have the following tests or measurements: Height, weight, and BMI. Blood pressure. Lipid and cholesterol levels. These may be checked every 5 years, or more frequently if you are over 60 years old. Skin check. Lung cancer screening. You may have this screening every year starting at age 67 if you have a 30-pack-year history of smoking and currently smoke or have quit within the past 15 years. Fecal occult blood test (FOBT) of the stool. You may have this test every year starting at age 67. Flexible sigmoidoscopy or colonoscopy. You may have a sigmoidoscopy every 5 years or a colonoscopy every 10 years starting at age 67. Prostate cancer screening. Recommendations will vary depending on your family history and other risks. Hepatitis C blood test. Hepatitis B blood test. Sexually transmitted disease (STD) testing. Diabetes screening. This is done by checking your blood sugar (glucose) after you have not eaten for a while (fasting). You may have this done every 1-3 years. Abdominal aortic aneurysm (AAA) screening. You may need this if you are a  current or former smoker. Osteoporosis. You may be screened starting at age 67 if you are at high risk. Talk with your health care provider about your test results, treatment options, and if necessary, the need for more tests. Vaccines  Your health care provider may recommend certain vaccines, such as: Influenza vaccine. This is recommended every year. Tetanus, diphtheria, and acellular  pertussis (Tdap, Td) vaccine. You may need a Td booster every 10 years. Zoster vaccine. You may need this after age 67. Pneumococcal 13-valent conjugate (PCV13) vaccine. One dose is recommended after age 67. Pneumococcal polysaccharide (PPSV23) vaccine. One dose is recommended after age 60. Talk to your health care provider about which screenings and vaccines you need and how often you need them. This information is not intended to replace advice given to you by your health care provider. Make sure you discuss any questions you have with your health care provider. Document Released: 01/10/2016 Document Revised: 09/02/2016 Document Reviewed: 10/15/2015 Elsevier Interactive Patient Education  2017 [employer].  Fall Prevention in the Home Falls can cause injuries. They can happen to people of all ages. There are many things you can do to make your home safe and to help prevent falls. What can I do on the outside of my home? Regularly fix the edges of walkways and driveways and fix any cracks. Remove anything that might make you trip as you walk through a door, such as a raised step or threshold. Trim any bushes or trees on the path to your home. Use bright outdoor lighting. Clear any walking paths of anything that might make someone trip, such as rocks or tools. Regularly check to see if handrails are loose or broken. Make sure that both sides of any steps have handrails. Any raised decks and porches should have guardrails on the edges. Have any leaves, snow, or ice cleared regularly. Use sand or salt on walking paths during winter. Clean up any spills in your garage right away. This includes oil or grease spills. What can I do in the bathroom? Use night lights. Install grab bars by the toilet and in the tub and shower. Do not use towel bars as grab bars. Use non-skid mats or decals in the tub or shower. If you need to sit down in the shower, use a plastic, non-slip stool. Keep the floor  dry. Clean up any water that spills on the floor as soon as it happens. Remove soap buildup in the tub or shower regularly. Attach bath mats securely with double-sided non-slip rug tape. Do not have throw rugs and other things on the floor that can make you trip. What can I do in the bedroom? Use night lights. Make sure that you have a light by your bed that is easy to reach. Do not use any sheets or blankets that are too big for your bed. They should not hang down onto the floor. Have a firm chair that has side arms. You can use this for support while you get dressed. Do not have throw rugs and other things on the floor that can make you trip. What can I do in the kitchen? Clean up any spills right away. Avoid walking on wet floors. Keep items that you use a lot in easy-to-reach places. If you need to reach something above you, use a strong step stool that has a grab bar. Keep electrical cords out of the way. Do not use floor polish or wax that makes floors slippery. If you must use  wax, use non-skid floor wax. Do not have throw rugs and other things on the floor that can make you trip. What can I do with my stairs? Do not leave any items on the stairs. Make sure that there are handrails on both sides of the stairs and use them. Fix handrails that are broken or loose. Make sure that handrails are as long as the stairways. Check any carpeting to make sure that it is firmly attached to the stairs. Fix any carpet that is loose or worn. Avoid having throw rugs at the top or bottom of the stairs. If you do have throw rugs, attach them to the floor with carpet tape. Make sure that you have a light switch at the top of the stairs and the bottom of the stairs. If you do not have them, ask someone to add them for you. What else can I do to help prevent falls? Wear shoes that: Do not have high heels. Have rubber bottoms. Are comfortable and fit you well. Are closed at the toe. Do not wear  sandals. If you use a stepladder: Make sure that it is fully opened. Do not climb a closed stepladder. Make sure that both sides of the stepladder are locked into place. Ask someone to hold it for you, if possible. Clearly Rena and make sure that you can see: Any grab bars or handrails. First and last steps. Where the edge of each step is. Use tools that help you move around (mobility aids) if they are needed. These include: Canes. Walkers. Scooters. Crutches. Turn on the lights when you go into a dark area. Replace any light bulbs as soon as they burn out. Set up your furniture so you have a clear path. Avoid moving your furniture around. If any of your floors are uneven, fix them. If there are any pets around you, be aware of where they are. Review your medicines with your doctor. Some medicines can make you feel dizzy. This can increase your chance of falling. Ask your doctor what other things that you can do to help prevent falls. This information is not intended to replace advice given to you by your health care provider. Make sure you discuss any questions you have with your health care provider. Document Released: 10/10/2009 Document Revised: 05/21/2016 Document Reviewed: 01/18/2015 Elsevier Interactive Patient Education  2017 [employer].

## 2023-05-14 NOTE — Progress Notes (Signed)
Subjective:   Jay Robertson. is a 67 y.o. male who presents for Medicare Annual/Subsequent preventive examination.  Review of Systems     Cardiac Risk Factors include: advanced age (>44men, >63 women);male gender;hypertension     Objective:    Today's Vitals   05/14/23 1427  BP: 120/64  Pulse: 62  Temp: 97.8 F (36.6 C)  TempSrc: Oral  SpO2: 95%  Weight: 177 lb 11.2 oz (80.6 kg)  Height: 5\' 11"  (1.803 m)   Body mass index is 24.78 kg/m.     05/14/2023    2:51 PM 03/09/2022    3:12 PM 02/05/2022    8:58 AM 08/02/2021    4:56 PM 01/30/2021    3:00 PM 01/29/2021   12:37 PM 02/12/2019    4:03 PM  Advanced Directives  Does Patient Have a Medical Advance Directive? Yes Yes Yes No No No No  Type of Estate agent of [place];Living will Healthcare Power of [place];Living will       Does patient want to make changes to medical advance directive? No - Patient declined No - Patient declined No - Patient declined      Copy of Healthcare Power of Attorney in Chart? Yes - validated most recent copy scanned in chart (See row information) No - copy requested       Would patient like information on creating a medical advance directive?     Yes (Inpatient - patient requests chaplain consult to create a medical advance directive) No - Patient declined No - Patient declined    Current Medications (verified) Outpatient Encounter Medications as of 05/14/2023  Medication Sig   spironolactone (ALDACTONE) 25 MG tablet TAKE 1 TABLET (25 MG TOTAL) BY MOUTH DAILY.   benazepril (LOTENSIN) 20 MG tablet TAKE 1 TABLET BY MOUTH EVERY DAY   Fexofenadine-Pseudoephedrine (ALLEGRA-D 24 HOUR PO) Take 1 tablet by mouth as needed.   omeprazole (PRILOSEC) 20 MG capsule TAKE 1 CAPSULE BY MOUTH EVERY DAY TAKE 30 MINUTES BEFORE A MEAL   sildenafil (VIAGRA) 100 MG tablet Take 100 mg by mouth daily as needed for erectile dysfunction.   tamsulosin (FLOMAX) 0.4 MG CAPS capsule TAKE 1 CAPSULE BY MOUTH  EVERY DAY (Patient not taking: Reported on 05/14/2023)   triamcinolone cream (KENALOG) 0.1 % APPLY TO AFFECTED AREA TWICE A DAY   No facility-administered encounter medications on file as of 05/14/2023.    Allergies (verified) Doxycycline   History: Past Medical History:  Diagnosis Date   ALLERGIC RHINITIS 10/24/2007   Blood transfusion without reported diagnosis 01/2021   Gastric ulcer    hx of gastric ulcer   GI bleed 01/2021   H. pylori infection    treated   Heart murmur    many years ago- no issues in the 1990's   HYPERLIPIDEMIA 10/16/2010   HYPERTENSION 07/25/2007   Past Surgical History:  Procedure Laterality Date   APPENDECTOMY     BIOPSY  02/01/2021   Procedure: BIOPSY;  Surgeon: Lemar Lofty., MD;  Location: [facility] ENDOSCOPY;  Service: Gastroenterology;;   CLAVICLE SURGERY     possibly as a child? unknown if surgery- broke collarbone   COLONOSCOPY WITH PROPOFOL N/A 02/01/2021   Procedure: COLONOSCOPY WITH PROPOFOL;  Surgeon: Lemar Lofty., MD;  Location: [facility] ENDOSCOPY;  Service: Gastroenterology;  Laterality: N/A;   ESOPHAGOGASTRODUODENOSCOPY (EGD) WITH PROPOFOL N/A 02/01/2021   Procedure: ESOPHAGOGASTRODUODENOSCOPY (EGD) WITH PROPOFOL;  Surgeon: Meridee Score Netty Starring., MD;  Location: [facility] ENDOSCOPY;  Service: Gastroenterology;  Laterality:  N/A;   GIVENS CAPSULE STUDY N/A 02/03/2021   Procedure: GIVENS CAPSULE STUDY;  Surgeon: Benancio Deeds, MD;  Location: [facility] ENDOSCOPY;  Service: Gastroenterology;  Laterality: N/A;   NASAL SEPTUM SURGERY     septal deviation, turbinate hypertrophy and obstruction   Family History  Problem Relation Age of Onset   Hypertension Mother    Diabetes Brother    Colon cancer Neg Hx    Esophageal cancer Neg Hx    Pancreatic cancer Neg Hx    Stomach cancer Neg Hx    Liver disease Neg Hx    Hemochromatosis Neg Hx    Rectal cancer Neg Hx    Colon polyps Neg Hx    Social History   Socioeconomic History   Marital status:  Single    Spouse name: Not on file   Number of children: Not on file   Years of education: Not on file   Highest education level: Not on file  Occupational History   Not on file  Tobacco Use   Smoking status: Never   Smokeless tobacco: Never   Tobacco comments:    pt notes he may have had 3-4 cigars yearly  Vaping Use   Vaping Use: Never used  Substance and Sexual Activity   Alcohol use: Yes    Comment: occ   Drug use: No   Sexual activity: Not on file  Other Topics Concern   Not on file  Social History Narrative   Not on file   Social Determinants of Health   Financial Resource Strain: Low Risk  (05/14/2023)   Overall Financial Resource Strain (CARDIA)    Difficulty of Paying Living Expenses: Not hard at all  Food Insecurity: No Food Insecurity (05/14/2023)   Hunger Vital Sign    Worried About Running Out of Food in the Last Year: Never true    Ran Out of Food in the Last Year: Never true  Transportation Needs: No Transportation Needs (05/14/2023)   PRAPARE - Administrator, Civil Service (Medical): No    Lack of Transportation (Non-Medical): No  Physical Activity: Insufficiently Active (05/14/2023)   Exercise Vital Sign    Days of Exercise per Week: 4 days    Minutes of Exercise per Session: 20 min  Stress: No Stress Concern Present (05/14/2023)   [employer] of Occupational Health - Occupational Stress Questionnaire    Feeling of Stress : Not at all  Social Connections: Moderately Integrated (05/14/2023)   Social Connection and Isolation Panel [NHANES]    Frequency of Communication with Friends and Family: More than three times a week    Frequency of Social Gatherings with Friends and Family: More than three times a week    Attends Religious Services: More than 4 times per year    Active Member of [employer] or Organizations: Yes    Attends Engineer, structural: More than 4 times per year    Marital Status: Divorced    Tobacco  Counseling Counseling given: Not Answered Tobacco comments: pt notes he may have had 3-4 cigars yearly   Clinical Intake:  Pre-visit preparation completed: No  Pain : No/denies pain     BMI - recorded: 24.78 Nutritional Status: BMI of 19-24  Normal Nutritional Risks: None Diabetes: No  How often do you need to have someone help you when you read instructions, pamphlets, or other written materials from your doctor or pharmacy?: 1 - Never  Diabetic?  No  Interpreter Needed?: No  Information  entered by :: Theresa Mulligan LPN   Activities of Daily Living    05/14/2023    2:49 PM  In your present state of health, do you have any difficulty performing the following activities:  Hearing? 0  Vision? 0  Difficulty concentrating or making decisions? 0  Walking or climbing stairs? 0  Dressing or bathing? 0  Doing errands, shopping? 0  Preparing Food and eating ? N  Using the Toilet? N  In the past six months, have you accidently leaked urine? N  Do you have problems with loss of bowel control? N  Managing your Medications? N  Managing your Finances? N  Housekeeping or managing your Housekeeping? N    Patient Care Team: Deeann Saint, MD as PCP - General (Family Medicine)  Indicate any recent Medical Services you may have received from other than [facility] providers in the past year (date may be approximate).     Assessment:   This is a routine wellness examination for [employer].  Hearing/Vision screen Hearing Screening - Comments:: Denies hearing difficulties   Vision Screening - Comments:: Wears reading glasses -Not up to date with routine eye exams with  Dr Shea Evans  Dietary issues and exercise activities discussed: Current Exercise Habits: Home exercise routine, Type of exercise: walking, Time (Minutes): 20, Frequency (Times/Week): 4, Weekly Exercise (Minutes/Week): 80, Intensity: Moderate, Exercise limited by: None identified   Goals Addressed               This  Visit's Progress     Make more money (pt-stated)         Depression Screen    05/14/2023    2:31 PM 01/13/2023    2:33 PM 01/08/2023    1:55 PM 06/12/2022    2:05 PM 03/09/2022    3:01 PM 02/04/2022    1:38 PM 12/05/2021   11:40 AM  PHQ 2/9 Scores  PHQ - 2 Score 0 0 0 0 0 0 0  PHQ- 9 Score  0  0   1    Fall Risk    05/14/2023    2:50 PM 01/13/2023    2:33 PM 06/12/2022    2:00 PM 03/09/2022    3:08 PM 02/04/2022    1:38 PM  Fall Risk   Falls in the past year? 1 0 1 1 0  Number falls in past yr: 0 0 0 0 0  Injury with Fall? 1 0 0 1 0  Comment Skin tear to left elbow. No medical attention needed.   Knee injury followed by ER Medical attention   Risk for fall due to : No Fall Risks No Fall Risks No Fall Risks Impaired balance/gait   Follow up Falls prevention discussed Falls evaluation completed Falls evaluation completed      FALL RISK PREVENTION PERTAINING TO THE HOME:  Any stairs in or around the home? Yes  If so, are there any without handrails? No  Home free of loose throw rugs in walkways, pet beds, electrical cords, etc? Yes  Adequate lighting in your home to reduce risk of falls? Yes   ASSISTIVE DEVICES UTILIZED TO PREVENT FALLS:  Life alert? No  Use of a cane, walker or w/c? No  Grab bars in the bathroom? No  Shower chair or bench in shower? No  Elevated toilet seat or a handicapped toilet? No   TIMED UP AND GO:  Was the test performed? Yes .  Length of time to ambulate 10 feet: 10 sec.  Gait steady and fast without use of assistive device  Cognitive Function:        05/14/2023    2:52 PM 03/09/2022    3:12 PM  6CIT Screen  What Year? 0 points 0 points  What month? 0 points 0 points  What time? 0 points 0 points  Count back from 20 0 points 0 points  Months in reverse 0 points 0 points  Repeat phrase 0 points 0 points  Total Score 0 points 0 points    Immunizations Immunization History  Administered Date(s) Administered   COVID-19, mRNA,  vaccine(Comirnaty)12 years and older 12/12/2022   PFIZER Comirnaty(Carino Top)Covid-19 Tri-Sucrose Vaccine 04/10/2021   PFIZER(Purple Top)SARS-COV-2 Vaccination 03/01/2020, 04/02/2020, 10/14/2020, 04/10/2021   Pfizer Covid-19 Vaccine Bivalent Booster 65yrs & up 09/15/2021, 05/08/2022   Td 07/04/2002   Tdap 11/29/2013    TDAP status: Up to date  Flu Vaccine status: Up to date  Pneumococcal vaccine status: Due, Education has been provided regarding the importance of this vaccine. Advised may receive this vaccine at local pharmacy or Health Dept. Aware to provide a copy of the vaccination record if obtained from local pharmacy or Health Dept. Verbalized acceptance and understanding.  Covid-19 vaccine status: Completed vaccines  Qualifies for Shingles Vaccine? Yes   Zostavax completed No   Shingrix Completed?: No.    Education has been provided regarding the importance of this vaccine. Patient has been advised to call insurance company to determine out of pocket expense if they have not yet received this vaccine. Advised may also receive vaccine at local pharmacy or Health Dept. Verbalized acceptance and understanding.  Screening Tests Health Maintenance  Topic Date Due   Zoster Vaccines- Shingrix (1 of 2) 08/14/2023 (Originally 01/01/2006)   Pneumonia Vaccine 34+ Years old (1 of 1 - PCV) 05/13/2024 (Originally 01/01/2021)   Hepatitis C Screening  05/13/2024 (Originally 01/01/1974)   INFLUENZA VACCINE  07/29/2023   DTaP/Tdap/Td (3 - Td or Tdap) 11/30/2023   Medicare Annual Wellness (AWV)  05/13/2024   COLONOSCOPY (Pts 45-27yrs Insurance coverage will need to be confirmed)  02/17/2025   COVID-19 Vaccine  Completed   HPV VACCINES  Aged Out    Health Maintenance  There are no preventive care reminders to display for this patient.   Colorectal cancer screening: Type of screening: Colonoscopy. Completed 02/17/22. Repeat every 3 years  Lung Cancer Screening: (Low Dose CT Chest recommended if  Age 48-80 years, 30 pack-year currently smoking OR have quit w/in 15years.) does not qualify.     Additional Screening:  Hepatitis C Screening: does qualify; Deferred  Vision Screening: Recommended annual ophthalmology exams for early detection of glaucoma and other disorders of the eye. Is the patient up to date with their annual eye exam?  Yes  Who is the provider or what is the name of the office in which the patient attends annual eye exams? Dr Shea Evans If pt is not established with a provider, would they like to be referred to a provider to establish care? No .   Dental Screening: Recommended annual dental exams for proper oral hygiene  Community Resource Referral / Chronic Care Management:  CRR required this visit?  No   CCM required this visit?  No      Plan:     I have personally reviewed and noted the following in the patient's chart:   Medical and social history Use of alcohol, tobacco or illicit drugs  Current medications and supplements including opioid prescriptions. Patient is not currently  taking opioid prescriptions. Functional ability and status Nutritional status Physical activity Advanced directives List of other physicians Hospitalizations, surgeries, and ER visits in previous 12 months Vitals Screenings to include cognitive, depression, and falls Referrals and appointments  In addition, I have reviewed and discussed with patient certain preventive protocols, quality metrics, and best practice recommendations. A written personalized care plan for preventive services as well as general preventive health recommendations were provided to patient.     Tillie Rung, LPN   1/61/0960   Nurse Notes: Patient due Hep-C Screening

## 2023-05-27 ENCOUNTER — Ambulatory Visit: Payer: Medicare Other | Admitting: Family Medicine

## 2023-06-09 ENCOUNTER — Ambulatory Visit (INDEPENDENT_AMBULATORY_CARE_PROVIDER_SITE_OTHER): Payer: Medicare Other | Admitting: Family Medicine

## 2023-06-09 ENCOUNTER — Encounter: Payer: Self-pay | Admitting: Family Medicine

## 2023-06-09 VITALS — BP 138/80 | HR 80 | Temp 98.0°F | Wt 181.2 lb

## 2023-06-09 DIAGNOSIS — L209 Atopic dermatitis, unspecified: Secondary | ICD-10-CM | POA: Diagnosis not present

## 2023-06-09 MED ORDER — TRIAMCINOLONE ACETONIDE 0.5 % EX OINT: 1.0000 | TOPICAL_OINTMENT | Freq: Two times a day (BID) | CUTANEOUS | 0 refills | Status: DC

## 2023-06-09 NOTE — Patient Instructions (Addendum)
Referral to the dermatologist was placed.  You should expect a phone call about scheduling this appointment.  In the meantime wear protective clothing and sunscreen when out working in the yard.  Avoid taking really hot showers.  Stop using alcohol on your skin.  A prescription for lightly stronger triamcinolone ointment was sent to your pharmacy.  You can use this on your forearms twice a day for the next 14 days.  Do not apply this to your face.

## 2023-06-09 NOTE — Progress Notes (Signed)
Established Patient Office Visit   Subjective  Patient ID: Jay Robertson., male    DOB: 1956-02-25  Age: 67 y.o. MRN: [id number]  Chief Complaint  Patient presents with   Medical Management of Chronic Issues    Dry skin on both arms. Not sure if it is from doing yard work -poison ivy or sumac. 1 month ago, got a foam month mattress, and dry skin came, and was itching. Used alcohol and peroxide, calamine lotion also.. Tired kenalog creme. Got rid of mattress 2 days ago.wants to know if needs to see derm    67 year old male seen for ongoing concern.  Patient endorses dry appearing pruritic rash on bilateral forearms x 1.5 months.  Patient was unsure if he came into contact with poisonous vines while cutting grass.  Tried calamine lotion without improvement symptoms.  Also tried eczema cream, peroxide, alcohol, triamcinolone 0.1%.  Patient thought his new foam mattress may be causing symptoms so he threw it out 2 days ago.  Patient notes mild improvement.  Does not have rash on any other parts of body.  Patient wears short-sleeved shirt long pants when cutting grass with a push mower.  Takes hot showers and uses alcohol to clean skin.  In the past patient did not use lotion.  Patient concerned about duration of rash.  Inquires if he should see Derm.    Past Medical History:  Diagnosis Date   ALLERGIC RHINITIS 10/24/2007   Blood transfusion without reported diagnosis 01/2021   Gastric ulcer    hx of gastric ulcer   GI bleed 01/2021   H. pylori infection    treated   Heart murmur    many years ago- no issues in the 1990's   HYPERLIPIDEMIA 10/16/2010   HYPERTENSION 07/25/2007   Past Surgical History:  Procedure Laterality Date   APPENDECTOMY     BIOPSY  02/01/2021   Procedure: BIOPSY;  Surgeon: Meridee Score Netty Starring., MD;  Location: [facility] ENDOSCOPY;  Service: Gastroenterology;;   CLAVICLE SURGERY     possibly as a child? unknown if surgery- broke collarbone   COLONOSCOPY WITH PROPOFOL N/A  02/01/2021   Procedure: COLONOSCOPY WITH PROPOFOL;  Surgeon: Lemar Lofty., MD;  Location: [facility] ENDOSCOPY;  Service: Gastroenterology;  Laterality: N/A;   ESOPHAGOGASTRODUODENOSCOPY (EGD) WITH PROPOFOL N/A 02/01/2021   Procedure: ESOPHAGOGASTRODUODENOSCOPY (EGD) WITH PROPOFOL;  Surgeon: Meridee Score Netty Starring., MD;  Location: [facility] ENDOSCOPY;  Service: Gastroenterology;  Laterality: N/A;   GIVENS CAPSULE STUDY N/A 02/03/2021   Procedure: GIVENS CAPSULE STUDY;  Surgeon: Benancio Deeds, MD;  Location: [facility] ENDOSCOPY;  Service: Gastroenterology;  Laterality: N/A;   NASAL SEPTUM SURGERY     septal deviation, turbinate hypertrophy and obstruction   Social History   Tobacco Use   Smoking status: Never   Smokeless tobacco: Never   Tobacco comments:    pt notes he may have had 3-4 cigars yearly  Vaping Use   Vaping Use: Never used  Substance Use Topics   Alcohol use: Yes    Comment: occ   Drug use: No   Family History  Problem Relation Age of Onset   Hypertension Mother    Diabetes Brother    Colon cancer Neg Hx    Esophageal cancer Neg Hx    Pancreatic cancer Neg Hx    Stomach cancer Neg Hx    Liver disease Neg Hx    Hemochromatosis Neg Hx    Rectal cancer Neg Hx    Colon polyps Neg Hx  Allergies  Allergen Reactions   Doxycycline Other (See Comments)    Headaches      ROS Negative unless stated above    Objective:     BP 138/80 (BP Location: Left Arm, Patient Position: Sitting, Cuff Size: Normal)   Pulse 80   Temp 98 F (36.7 C) (Oral)   Wt 181 lb 3.2 oz (82.2 kg)   SpO2 96%   BMI 25.27 kg/m    Physical Exam Constitutional:      Appearance: Normal appearance.  HENT:     Head: Normocephalic and atraumatic.     Mouth/Throat:     Mouth: Mucous membranes are moist.  Eyes:     Extraocular Movements: Extraocular movements intact.     Conjunctiva/sclera: Conjunctivae normal.  Cardiovascular:     Rate and Rhythm: Normal rate.  Pulmonary:     Effort:  Pulmonary effort is normal.  Skin:    General: Skin is warm and dry.          Comments: Dorsum of bilateral forearms with hypertrophic, hyperpigmented, dry plaque with excoriation, no lesions noted.  Areas of eschar from scratching.  Distinct demarcating rash in upper arm.  Neurological:     Mental Status: He is alert and oriented to person, place, and time. Mental status is at baseline.      No results found for any visits on 06/09/23.    Assessment & Plan:  Atopic dermatitis, unspecified type -     Ambulatory referral to Dermatology -     Triamcinolone Acetonide; Apply 1 Application topically 2 (two) times daily.  Dispense: 30 g; Refill: 0  Pt advised rash on bilateral forearms eczematous in appearance.  Unclear if pt came into contact with poisonous vines when rash for started.  Patient has had issues with eczema in the past.  Advised to stop using alcohol on skin and avoid hot showers which are further drying skin.  Wear protective clothing when outdoors and sunscreen.  Triamcinolone ointment sent to pharmacy.  Derm referral placed.  Return if symptoms worsen or fail to improve.   Deeann Saint, MD

## 2023-08-05 ENCOUNTER — Ambulatory Visit (INDEPENDENT_AMBULATORY_CARE_PROVIDER_SITE_OTHER): Payer: Medicare Other | Admitting: Family Medicine

## 2023-08-05 VITALS — BP 132/80 | HR 75 | Temp 98.1°F | Wt 184.2 lb

## 2023-08-05 DIAGNOSIS — R42 Dizziness and giddiness: Secondary | ICD-10-CM

## 2023-08-05 DIAGNOSIS — I10 Essential (primary) hypertension: Secondary | ICD-10-CM | POA: Diagnosis not present

## 2023-08-05 DIAGNOSIS — E7841 Elevated Lipoprotein(a): Secondary | ICD-10-CM

## 2023-08-05 DIAGNOSIS — R0989 Other specified symptoms and signs involving the circulatory and respiratory systems: Secondary | ICD-10-CM | POA: Diagnosis not present

## 2023-08-05 NOTE — Progress Notes (Signed)
Established Patient Office Visit   Subjective  Patient ID: Jay Raia., male    DOB: 07/12/1956  Age: 67 y.o. MRN: [id number]  Chief Complaint  Patient presents with   Dizziness    Lightheaded for 1 week, bp and HR has been up and down. Wants to know how to get ahead of things, due to friend having issues -stints, blockages, pacemakers. Want to know if he has any blockages. 147/84 July. 154/86 recently.140/92 HR 78 today. 8/6 158/86 @ 7:18 pm. Yesterday 140/91.    Patient is a 67 year old male seen for ongoing concern.  Patient endorses lightheadedness x 1 week.  Noticed first thing in the morning upon getting out of bed.  Yesterday patient also felt dizzy while at the gym.  States he had a warm pulsating sensation come over him.  In the past similar episodes were caused by elevated blood pressure and diet.  Patient states BP has been 152/85, 122/83, 135-158/?.  Patient has home BP cuff with him this visit.  In office reading 132/80, home cuff 143/89.  Patient notes eating more fried foods and fast food within the last week.  Patient does note he cut down on bacon intake.  Now having 3 pieces every day instead of 8+.  Patient expresses difficulty making diet changes as he does not cook and healthier options do not taste good.    Past Medical History:  Diagnosis Date   ALLERGIC RHINITIS 10/24/2007   Blood transfusion without reported diagnosis 01/2021   Gastric ulcer    hx of gastric ulcer   GI bleed 01/2021   H. pylori infection    treated   Heart murmur    many years ago- no issues in the 1990's   HYPERLIPIDEMIA 10/16/2010   HYPERTENSION 07/25/2007   Past Surgical History:  Procedure Laterality Date   APPENDECTOMY     BIOPSY  02/01/2021   Procedure: BIOPSY;  Surgeon: Meridee Score Netty Starring., MD;  Location: [facility] ENDOSCOPY;  Service: Gastroenterology;;   CLAVICLE SURGERY     possibly as a child? unknown if surgery- broke collarbone   COLONOSCOPY WITH PROPOFOL N/A 02/01/2021    Procedure: COLONOSCOPY WITH PROPOFOL;  Surgeon: Lemar Lofty., MD;  Location: [facility] ENDOSCOPY;  Service: Gastroenterology;  Laterality: N/A;   ESOPHAGOGASTRODUODENOSCOPY (EGD) WITH PROPOFOL N/A 02/01/2021   Procedure: ESOPHAGOGASTRODUODENOSCOPY (EGD) WITH PROPOFOL;  Surgeon: Meridee Score Netty Starring., MD;  Location: [facility] ENDOSCOPY;  Service: Gastroenterology;  Laterality: N/A;   GIVENS CAPSULE STUDY N/A 02/03/2021   Procedure: GIVENS CAPSULE STUDY;  Surgeon: Benancio Deeds, MD;  Location: [facility] ENDOSCOPY;  Service: Gastroenterology;  Laterality: N/A;   NASAL SEPTUM SURGERY     septal deviation, turbinate hypertrophy and obstruction   Social History   Tobacco Use   Smoking status: Never   Smokeless tobacco: Never   Tobacco comments:    pt notes he may have had 3-4 cigars yearly  Vaping Use   Vaping status: Never Used  Substance Use Topics   Alcohol use: Yes    Comment: occ   Drug use: No   Family History  Problem Relation Age of Onset   Hypertension Mother    Diabetes Brother    Colon cancer Neg Hx    Esophageal cancer Neg Hx    Pancreatic cancer Neg Hx    Stomach cancer Neg Hx    Liver disease Neg Hx    Hemochromatosis Neg Hx    Rectal cancer Neg Hx    Colon polyps Neg  Hx    Allergies  Allergen Reactions   Doxycycline Other (See Comments)    Headaches      ROS Negative unless stated above    Objective:     BP 132/80 (BP Location: Left Arm, Patient Position: Sitting, Cuff Size: Normal)   Pulse 75   Temp 98.1 F (36.7 C) (Oral)   Wt 184 lb 3.2 oz (83.6 kg)   SpO2 95%   BMI 25.69 kg/m  BP Readings from Last 3 Encounters:  08/05/23 132/80  06/09/23 138/80  05/14/23 120/64   Wt Readings from Last 3 Encounters:  08/05/23 184 lb 3.2 oz (83.6 kg)  06/09/23 181 lb 3.2 oz (82.2 kg)  05/14/23 177 lb 11.2 oz (80.6 kg)      Physical Exam Constitutional:      General: He is not in acute distress.    Appearance: Normal appearance.  HENT:     Head:  Normocephalic and atraumatic.     Nose: Nose normal.     Mouth/Throat:     Mouth: Mucous membranes are moist.  Neck:     Vascular: Carotid bruit present.     Comments: Left carotid artery bruit. Cardiovascular:     Rate and Rhythm: Normal rate and regular rhythm.     Heart sounds: Normal heart sounds. No murmur heard.    No gallop.  Pulmonary:     Effort: Pulmonary effort is normal. No respiratory distress.     Breath sounds: Normal breath sounds. No wheezing, rhonchi or rales.  Skin:    General: Skin is warm and dry.  Neurological:     Mental Status: He is alert and oriented to person, place, and time.      No results found for any visits on 08/05/23.    Assessment & Plan:  Essential hypertension -     Ambulatory referral to Cardiology -     Lipid panel; Future  Elevated lipoprotein(a) -     Ambulatory referral to Cardiology -     Lipid panel; Future  Lightheadedness -     Ambulatory referral to Cardiology -     Lipid panel; Future  Bruit of left carotid artery -     Ambulatory referral to Cardiology -     Lipid panel; Future   Discussed current symptoms likely multifactorial.  Patient's home BP cuff reading about 10 points higher on both systolic and diastolic.  Consider obtaining new cuff.  Discussed the importance of lifestyle modifications.  Increase sodium in diet likely causing elevation in BP and lightheadedness.  Patient advised to increase p.o. intake of water and fluids.  Bruit heard on exam.  Referral to cardiology placed  Return in about 6 weeks (around 09/16/2023).   Deeann Saint, MD

## 2023-08-13 ENCOUNTER — Encounter: Payer: Self-pay | Admitting: Family Medicine

## 2023-08-28 ENCOUNTER — Other Ambulatory Visit: Payer: Self-pay | Admitting: Gastroenterology

## 2023-09-01 ENCOUNTER — Other Ambulatory Visit: Payer: Self-pay | Admitting: Gastroenterology

## 2023-10-07 ENCOUNTER — Ambulatory Visit: Payer: Federal, State, Local not specified - PPO | Admitting: Cardiovascular Disease

## 2023-10-10 ENCOUNTER — Other Ambulatory Visit: Payer: Self-pay | Admitting: Family Medicine

## 2023-10-10 DIAGNOSIS — N401 Enlarged prostate with lower urinary tract symptoms: Secondary | ICD-10-CM

## 2023-10-19 ENCOUNTER — Other Ambulatory Visit: Payer: Self-pay | Admitting: Family Medicine

## 2023-10-19 DIAGNOSIS — I10 Essential (primary) hypertension: Secondary | ICD-10-CM

## 2023-10-22 ENCOUNTER — Other Ambulatory Visit: Payer: Self-pay | Admitting: Gastroenterology

## 2023-10-25 ENCOUNTER — Telehealth: Payer: Self-pay | Admitting: Family Medicine

## 2023-10-25 NOTE — Addendum Note (Signed)
Addended by: Philipp Deputy A on: 10/25/2023 02:29 PM   Modules accepted: Orders

## 2023-10-25 NOTE — Telephone Encounter (Signed)
Prescription Request  10/25/2023  LOV: 08/05/2023  What is the name of the medication or equipment? omeprazole (PRILOSEC) 20 MG capsule  Have you contacted your pharmacy to request a refill? Yes   Which pharmacy would you like this sent to?  [id number] [place],  - [address] [place] [address] Phone: [phone] Fax: [phone]     Patient notified that their request is being sent to the clinical staff for review and that they should receive a response within 2 business days.   Please advise at Mobile [phone] (mobile)

## 2023-11-01 MED ORDER — OMEPRAZOLE 20 MG PO CPDR: 20.0000 mg | DELAYED_RELEASE_CAPSULE | Freq: Every day | ORAL | 0 refills | Status: DC

## 2023-11-01 NOTE — Telephone Encounter (Signed)
Pt called to F/U on this refill request, stating he is completely out.  Please send refill, asap.

## 2023-11-01 NOTE — Telephone Encounter (Signed)
Patient is aware 

## 2023-11-01 NOTE — Addendum Note (Signed)
Addended by: Philipp Deputy A on: 11/01/2023 12:02 PM   Modules accepted: Orders

## 2023-11-02 ENCOUNTER — Ambulatory Visit: Payer: Medicare Other | Attending: Cardiovascular Disease | Admitting: Cardiovascular Disease

## 2023-11-02 ENCOUNTER — Encounter: Payer: Self-pay | Admitting: Cardiovascular Disease

## 2023-11-02 VITALS — BP 130/76 | HR 64 | Ht 71.0 in | Wt 188.0 lb

## 2023-11-02 DIAGNOSIS — R42 Dizziness and giddiness: Secondary | ICD-10-CM | POA: Diagnosis not present

## 2023-11-02 DIAGNOSIS — I10 Essential (primary) hypertension: Secondary | ICD-10-CM | POA: Diagnosis not present

## 2023-11-02 DIAGNOSIS — R002 Palpitations: Secondary | ICD-10-CM

## 2023-11-02 DIAGNOSIS — Z72820 Sleep deprivation: Secondary | ICD-10-CM

## 2023-11-02 DIAGNOSIS — E7800 Pure hypercholesterolemia, unspecified: Secondary | ICD-10-CM | POA: Diagnosis not present

## 2023-11-02 DIAGNOSIS — R0689 Other abnormalities of breathing: Secondary | ICD-10-CM

## 2023-11-02 NOTE — Patient Instructions (Addendum)
Medication Instructions:  No changes     Lab Work: Not   needed If you have labs (blood work) drawn today and your tests are completely normal, you will receive your results only by: MyChart Message (if you have MyChart) OR A paper copy in the mail If you have any lab test that is abnormal or we need to change your treatment, we will call you to review the results.   Testing/Procedures:  Not needed  Follow-Up: At [facility], you and your health needs are our priority.  As part of our continuing mission to provide you with exceptional heart care, we have created designated Provider Care Teams.  These Care Teams include your primary Cardiologist (physician) and Advanced Practice Providers (APPs -  Physician Assistants and Nurse Practitioners) who all work together to provide you with the care you need, when you need it.     Your next appointment:   As needed     The format for your next appointment:   In Person  Provider:   None    Other Instructions

## 2023-11-02 NOTE — Progress Notes (Signed)
Cardiology Office Note    Date:  11/08/2023   ID:  Jay Radford., DOB 1956-02-10, MRN [id number]  PCP:  Deeann Saint, MD  Cardiologist:  Nicki Guadalajara, MD   Chief Complaint  Patient presents with   New Patient (Initial Visit)   Headache   Shortness of Breath    At night.    New cardiology consultation referred through the courtesy of Dr. Abbe Amsterdam for evaluation of lightheadedness, palpitations, and who admits to waking up gasping for breath.  History of Present Illness:  Jay Niccoli. is a 67 y.o. male who was recently evaluated by Dr. Abbe Amsterdam and complained of symptoms of lightheadedness, and some blood pressure lability.  Remotely, in 2022 he had undergone an echo Doppler study which showed normal LV function with EF 60 to 65% with grade 1 diastolic dysfunction.  A CT angio of his abdomen and pelvis February 2022 showed mild atherosclerotic disease.  There was no acute abnormality in the abdomen or pelvis and there was endoscopy capsule in the cecum.  He had persistent small densities in the left lung lower lobe possibly representing a postinflammatory changes.  He admits to eating a diet of fried foods and fast foods which has not been heart healthy.  He experiences most of this lightheadedness in the morning.  He was recently evaluated by Abbe Amsterdam in August 2024 at which time his blood pressure was 132/80.  He was felt to have a soft left carotid bruit he has experienced recent episodes of lightheadedness.  Prior laboratory has shown significant hyperlipidemia in January 2024 with total cholesterol 201, HDL 40.6, LDL 146, and triglycerides 75.  Hemoglobin A1c in April 2024 was 5.7.  He has a history of hypertension for approximately 20 years.  Presently, he denies any exertional precipitated chest pain.  Upon questioning, he admits that he at times wakes up gasping for breath from sleep.  He may typically go to bed between 7 and 8 PM, is often up at 11 PM to  midnight and then may go back to sleep at 3 AM until 7 AM.  He presents for initial consultation and evaluation.   Past Medical History:  Diagnosis Date   ALLERGIC RHINITIS 10/24/2007   Blood transfusion without reported diagnosis 01/2021   Gastric ulcer    hx of gastric ulcer   GI bleed 01/2021   H. pylori infection    treated   Heart murmur    many years ago- no issues in the 1990's   HYPERLIPIDEMIA 10/16/2010   HYPERTENSION 07/25/2007    Past Surgical History:  Procedure Laterality Date   APPENDECTOMY     BIOPSY  02/01/2021   Procedure: BIOPSY;  Surgeon: Meridee Score Netty Starring., MD;  Location: [facility] ENDOSCOPY;  Service: Gastroenterology;;   CLAVICLE SURGERY     possibly as a child? unknown if surgery- broke collarbone   COLONOSCOPY WITH PROPOFOL N/A 02/01/2021   Procedure: COLONOSCOPY WITH PROPOFOL;  Surgeon: Lemar Lofty., MD;  Location: [facility] ENDOSCOPY;  Service: Gastroenterology;  Laterality: N/A;   ESOPHAGOGASTRODUODENOSCOPY (EGD) WITH PROPOFOL N/A 02/01/2021   Procedure: ESOPHAGOGASTRODUODENOSCOPY (EGD) WITH PROPOFOL;  Surgeon: Meridee Score Netty Starring., MD;  Location: [facility] ENDOSCOPY;  Service: Gastroenterology;  Laterality: N/A;   GIVENS CAPSULE STUDY N/A 02/03/2021   Procedure: GIVENS CAPSULE STUDY;  Surgeon: Benancio Deeds, MD;  Location: [facility] ENDOSCOPY;  Service: Gastroenterology;  Laterality: N/A;   NASAL SEPTUM SURGERY  septal deviation, turbinate hypertrophy and obstruction    Current Medications: Outpatient Medications Prior to Visit  Medication Sig Dispense Refill   benazepril (LOTENSIN) 20 MG tablet TAKE 1 TABLET BY MOUTH EVERY DAY 90 tablet 3   Fexofenadine-Pseudoephedrine (ALLEGRA-D 24 HOUR PO) Take 1 tablet by mouth as needed.     omeprazole (PRILOSEC) 20 MG capsule Take 1 capsule (20 mg total) by mouth daily. 30 capsule 0   sildenafil (VIAGRA) 100 MG tablet Take 100 mg by mouth daily as needed for erectile dysfunction.     spironolactone (ALDACTONE) 25  MG tablet TAKE 1 TABLET (25 MG TOTAL) BY MOUTH DAILY. 90 tablet 1   triamcinolone cream (KENALOG) 0.1 % APPLY TO AFFECTED AREA TWICE A DAY 60 g 2   triamcinolone ointment (KENALOG) 0.5 % Apply 1 Application topically 2 (two) times daily. 30 g 0   No facility-administered medications prior to visit.     Allergies:   Doxycycline   Social History   Socioeconomic History   Marital status: Single    Spouse name: Not on file   Number of children: Not on file   Years of education: Not on file   Highest education level: Not on file  Occupational History   Not on file  Tobacco Use   Smoking status: Never   Smokeless tobacco: Never   Tobacco comments:    pt notes he may have had 3-4 cigars yearly  Vaping Use   Vaping status: Never Used  Substance and Sexual Activity   Alcohol use: Yes    Comment: occ   Drug use: No   Sexual activity: Not on file  Other Topics Concern   Not on file  Social History Narrative   Not on file   Social Determinants of Health   Financial Resource Strain: Low Risk  (05/14/2023)   Overall Financial Resource Strain (CARDIA)    Difficulty of Paying Living Expenses: Not hard at all  Food Insecurity: No Food Insecurity (05/14/2023)   Hunger Vital Sign    Worried About Running Out of Food in the Last Year: Never true    Ran Out of Food in the Last Year: Never true  Transportation Needs: No Transportation Needs (05/14/2023)   PRAPARE - Administrator, Civil Service (Medical): No    Lack of Transportation (Non-Medical): No  Physical Activity: Insufficiently Active (05/14/2023)   Exercise Vital Sign    Days of Exercise per Week: 4 days    Minutes of Exercise per Session: 20 min  Stress: No Stress Concern Present (05/14/2023)   [employer] of Occupational Health - Occupational Stress Questionnaire    Feeling of Stress : Not at all  Social Connections: Moderately Integrated (05/14/2023)   Social Connection and Isolation Panel [NHANES]     Frequency of Communication with Friends and Family: More than three times a week    Frequency of Social Gatherings with Friends and Family: More than three times a week    Attends Religious Services: More than 4 times per year    Active Member of [employer] or Organizations: Yes    Attends Engineer, structural: More than 4 times per year    Marital Status: Divorced    Socially he was born in [place].  He is currently divorced and was married for 13 years.  He has been divorced for 34 years.  He has 3 children.  There is no tobacco history.  He is not had alcohol since his GI bleed  in 2022.  He walks approximately 2-3 times per week for 20 minutes.  Family History:  The patient's family history includes Diabetes in his brother; Hypertension in his mother.  Mother died at age 110 and had hypertension and a brain aneurysm.  Father is deceased and had emphysema.  He has a brother age 24.  He has a sister age 6 with hypertension.  He has 2 sons age 1 and 18 and a daughter age 34.  ROS General: Negative; No fevers, chills, or night sweats;  HEENT: Negative; No changes in vision or hearing, sinus congestion, difficulty swallowing Pulmonary: Negative; No cough, wheezing, shortness of breath, hemoptysis Cardiovascular: See HPI GI: Negative; No nausea, vomiting, diarrhea, or abdominal pain GU: Negative; No dysuria, hematuria, or difficulty voiding Musculoskeletal: Negative; no myalgias, joint pain, or weakness Hematologic/Oncology: Negative; no easy bruising, bleeding Endocrine: Negative; no heat/cold intolerance; no diabetes Neuro: Negative; no changes in balance, headaches Skin: Negative; No rashes or skin lesions Psychiatric: Negative; No behavioral problems, depression Sleep: Poor sleep, has awaken gasping for breath Other comprehensive 14 point system review is negative.   PHYSICAL EXAM:   VS:  BP 130/76 (BP Location: Left Arm, Patient Position: Sitting, Cuff Size: Normal)   Pulse 64    Ht 5\' 11"  (1.803 m)   Wt 188 lb (85.3 kg)   BMI 26.22 kg/m     Repeat blood pressure by me was 120/78  Wt Readings from Last 3 Encounters:  11/02/23 188 lb (85.3 kg)  08/05/23 184 lb 3.2 oz (83.6 kg)  06/09/23 181 lb 3.2 oz (82.2 kg)    General: Alert, oriented, no distress.  Skin: normal turgor, no rashes, warm and dry HEENT: Normocephalic, atraumatic. Pupils equal round and reactive to light; sclera anicteric; extraocular muscles intact;  Nose without nasal septal hypertrophy Mouth/Parynx benign; Mallinpatti scale 3 Neck: Soft left carotid bruit Lungs: clear to ausculatation and percussion; no wheezing or rales Chest wall: without tenderness to palpitation Heart: PMI not displaced, RRR, s1 s2 normal, 1-2/6 systolic murmur, no diastolic murmur, no rubs, gallops, thrills, or heaves Abdomen: soft, nontender; no hepatosplenomehaly, BS+; abdominal aorta nontender and not dilated by palpation. Back: no CVA tenderness Pulses 2+ Musculoskeletal: full range of motion, normal strength, no joint deformities Extremities: no clubbing cyanosis or edema, Homan's sign negative  Neurologic: grossly nonfocal; Cranial nerves grossly wnl Psychologic: Normal mood and affect   Studies/Labs Reviewed:   EKG Interpretation Date/Time:  Tuesday November 02 2023 11:36:38 EST Ventricular Rate:  64 PR Interval:  192 QRS Duration:  94 QT Interval:  386 QTC Calculation: 398 R Axis:   77  Text Interpretation: Normal sinus rhythm Nonspecific ST and T wave abnormality When compared with ECG of 05-Feb-2022 08:55, PREVIOUS ECG IS PRESENT Confirmed by Nicki Guadalajara ([id number]) on 11/02/2023 12:18:40 PM    Recent Labs:    Latest Ref Rng & Units 01/13/2023    2:26 PM 02/05/2022    9:07 AM 02/04/2022    2:37 PM  BMP  Glucose 70 - 99 mg/dL 84  97  64   BUN 6 - 23 mg/dL 15  16  12    Creatinine 0.40 - 1.50 mg/dL 3.08  6.57  8.46   Sodium 135 - 145 mEq/L 135  136  136   Potassium 3.5 - 5.1 mEq/L 4.2  3.9  4.2    Chloride 96 - 112 mEq/L 99  103  101   CO2 19 - 32 mEq/L 28  24  27  Calcium 8.4 - 10.5 mg/dL 9.5  9.5  9.7         Latest Ref Rng & Units 01/13/2023    2:26 PM 02/04/2022    2:37 PM 08/14/2021   11:15 AM  Hepatic Function  Total Protein 6.0 - 8.3 g/dL 7.6  7.3  7.2   Albumin 3.5 - 5.2 g/dL 4.3  4.3  4.1   AST 0 - 37 U/L 18  15  15    ALT 0 - 53 U/L 18  22  19    Alk Phosphatase 39 - 117 U/L 62  59  61   Total Bilirubin 0.2 - 1.2 mg/dL 0.9  0.8  0.8        Latest Ref Rng & Units 02/05/2022    9:07 AM 02/04/2022    2:37 PM 01/26/2022   11:37 PM  CBC  WBC 4.0 - 10.5 K/uL 3.7  4.5  6.4   Hemoglobin 13.0 - 17.0 g/dL 45.4  09.8  11.9   Hematocrit 39.0 - 52.0 % 40.1  43.2  34.4   Platelets 150 - 400 K/uL 235  252.0  200    Lab Results  Component Value Date   MCV 91.1 02/05/2022   MCV 96.0 02/04/2022   MCV 93.7 01/26/2022   Lab Results  Component Value Date   TSH 1.83 08/14/2021   Lab Results  Component Value Date   HGBA1C 5.7 (A) 03/31/2023     BNP No results found for: "BNP"  ProBNP No results found for: "PROBNP"   Lipid Panel     Component Value Date/Time   CHOL 201 (H) 01/13/2023 1426   TRIG 75.0 01/13/2023 1426   HDL 40.60 01/13/2023 1426   CHOLHDL 5 01/13/2023 1426   VLDL 15.0 01/13/2023 1426   LDLCALC 146 (H) 01/13/2023 1426   LDLDIRECT 163.0 10/09/2010 1204     RADIOLOGY: No results found.   Additional studies/ records that were reviewed today include:   Prior records from 2022 were reviewed including echo and CT angio of abdomen.  Recent records of Jay Rough, MD were reviewed.  ASSESSMENT:    1. Essential hypertension   2. Lightheadedness   3. Palpitations   4. Pure hypercholesterolemia   5. Poor sleep   6. Awakening gasping for breath     PLAN:  Mr. Giulian Assi is a 67 year old African-American male who has a longstanding history of hypertension for approximately 20 years.  He has remote history of alcohol use and quit EtOH consumption  in 2022.  An echo Doppler study in 2022 showed normal systolic function with EF 60 to 65% with grade 1 diastolic dysfunction.  Recently, the patient has experienced periods of lightheadedness particularly in the morning and palpitations.  He was recently evaluated by Dr. Abbe Amsterdam.  It was noted that he had a soft left carotid bruit.  He has poor diet regarding eating heart healthy foods and has been demonstrated to have hyperlipidemia and most recent lipid studies in January 2024 which showed total cholesterol 201 LDL cholesterol 146 with triglycerides 75 and HDL 40.  He does have a 1/6 to 2/6 systolic murmur on physical exam.  As result, I have recommended he undergo a 2D echo Doppler study for reassessment of LV systolic and diastolic function and valvular architecture.  With his palpitations I have recommended he wear a 2-week Zio patch monitor.  I also have recommended repeat fasting comprehensive metabolic panel, lipid panel and will also check an LP(a) 8.  With his coronary risk factors I have recommended baseline screening for coronary calcification with an underlying coronary calcium score.  He also has disruptive sleep and has awaken gasping for breath.  I am concerned he may also have obstructive sleep apnea and have recommended he undergo an in-lab split-night study for evaluation.  I had a very extensive discussion with the patient regarding why have recommended the studies.  However, after significant time spent with the patient in the office, the patient states he will think about it but probably will not undergo these test presently since he felt he was to be here for a stress test.   Medication Adjustments/Labs and Tests Ordered: Current medicines are reviewed at length with the patient today.  Concerns regarding medicines are outlined above.  Medication changes, Labs and Tests ordered today are listed in the Patient Instructions below. Patient Instructions  Medication Instructions:  No  changes     Lab Work: Not   needed If you have labs (blood work) drawn today and your tests are completely normal, you will receive your results only by: MyChart Message (if you have MyChart) OR A paper copy in the mail If you have any lab test that is abnormal or we need to change your treatment, we will call you to review the results.   Testing/Procedures:  Not needed  Follow-Up: At [facility], you and your health needs are our priority.  As part of our continuing mission to provide you with exceptional heart care, we have created designated Provider Care Teams.  These Care Teams include your primary Cardiologist (physician) and Advanced Practice Providers (APPs -  Physician Assistants and Nurse Practitioners) who all work together to provide you with the care you need, when you need it.     Your next appointment:   As needed     The format for your next appointment:   In Person  Provider:   None    Other Instructions    Signed, Nicki Guadalajara, MD  11/08/2023 10:00 AM    [facility] [address], [address], [place], [place]  [address] Phone: [phone]

## 2023-11-08 ENCOUNTER — Encounter: Payer: Self-pay | Admitting: Cardiovascular Disease

## 2023-11-28 ENCOUNTER — Other Ambulatory Visit: Payer: Self-pay | Admitting: Family Medicine

## 2023-12-26 ENCOUNTER — Other Ambulatory Visit: Payer: Self-pay | Admitting: Family Medicine

## 2023-12-30 ENCOUNTER — Telehealth: Payer: Self-pay

## 2023-12-30 NOTE — Telephone Encounter (Signed)
 Copied from CRM [id number]. Topic: Clinical - Medication Question >> Dec 28, 2023  8:33 AM Eleanor C wrote: Reason for CRM: patient requested refill of omeprazole  (PRILOSEC) 20 MG capsule [Pharmacy Med Name: OMEPRAZOLE  DR 20 MG CAPSULE] and as of 12/29 it is still showing Pend status. Please advise and sign as soon as you can and/or inform patient if refused for any reason.

## 2023-12-30 NOTE — Telephone Encounter (Signed)
 Called patient to schedule an annual visit per protocol, medication has not been sent to pharmacy, patient will call back to sch.

## 2024-01-12 ENCOUNTER — Ambulatory Visit (INDEPENDENT_AMBULATORY_CARE_PROVIDER_SITE_OTHER): Payer: Medicare Other | Admitting: Family Medicine

## 2024-01-12 ENCOUNTER — Encounter: Payer: Self-pay | Admitting: Family Medicine

## 2024-01-12 VITALS — BP 124/80 | HR 77 | Temp 98.4°F | Ht 71.0 in | Wt 192.8 lb

## 2024-01-12 DIAGNOSIS — R42 Dizziness and giddiness: Secondary | ICD-10-CM

## 2024-01-12 DIAGNOSIS — K219 Gastro-esophageal reflux disease without esophagitis: Secondary | ICD-10-CM

## 2024-01-12 DIAGNOSIS — E782 Mixed hyperlipidemia: Secondary | ICD-10-CM | POA: Diagnosis not present

## 2024-01-12 DIAGNOSIS — R7303 Prediabetes: Secondary | ICD-10-CM

## 2024-01-12 DIAGNOSIS — R0989 Other specified symptoms and signs involving the circulatory and respiratory systems: Secondary | ICD-10-CM

## 2024-01-12 DIAGNOSIS — I10 Essential (primary) hypertension: Secondary | ICD-10-CM

## 2024-01-12 DIAGNOSIS — E7841 Elevated Lipoprotein(a): Secondary | ICD-10-CM

## 2024-01-12 MED ORDER — OMEPRAZOLE 20 MG PO CPDR
20.0000 mg | DELAYED_RELEASE_CAPSULE | Freq: Every day | ORAL | 3 refills | Status: AC
Start: 2024-01-12 — End: ?

## 2024-01-12 MED ORDER — SPIRONOLACTONE 25 MG PO TABS: 25.0000 mg | ORAL_TABLET | Freq: Every day | ORAL | 3 refills | Status: DC

## 2024-01-12 NOTE — Progress Notes (Signed)
Established Patient Office Visit   Subjective  Patient ID: Jay Leno., male    DOB: December 15, 1956  Age: 68 y.o. MRN: [id number]  Chief Complaint  Patient presents with   Medication Refill    Cpe     Patient is a 68 year old male seen for follow-up and med refill.  Patient had AWV 05/14/2023.  Patient states he is doing well overall.  Requesting refill on omeprazole.  Endorses occasional lightheaded sensation.  Patient seen by cardiology in November.  Sleep study recommended for possible OSA as well as Zio patch and labs.  Patient declined.    Patient Active Problem List   Diagnosis Date Noted   History of Helicobacter infection 03/20/2021   Hx of colonic polyps 03/20/2021   History of lower GI bleeding 03/20/2021   History of gastric ulcer 03/20/2021   Diverticulosis of large intestine with hemorrhage    GI bleed 01/30/2021   Acute lower GI bleeding 01/29/2021   Syncope 01/29/2021   Pure hypercholesterolemia 10/16/2010   Allergic rhinitis 10/24/2007   Essential hypertension 07/25/2007   Past Medical History:  Diagnosis Date   ALLERGIC RHINITIS 10/24/2007   Blood transfusion without reported diagnosis 01/2021   Gastric ulcer    hx of gastric ulcer   GI bleed 01/2021   H. pylori infection    treated   Heart murmur    many years ago- no issues in the 1990's   HYPERLIPIDEMIA 10/16/2010   HYPERTENSION 07/25/2007   Past Surgical History:  Procedure Laterality Date   APPENDECTOMY     BIOPSY  02/01/2021   Procedure: BIOPSY;  Surgeon: Lemar Lofty., MD;  Location: [facility] ENDOSCOPY;  Service: Gastroenterology;;   CLAVICLE SURGERY     possibly as a child? unknown if surgery- broke collarbone   COLONOSCOPY WITH PROPOFOL N/A 02/01/2021   Procedure: COLONOSCOPY WITH PROPOFOL;  Surgeon: Lemar Lofty., MD;  Location: [facility] ENDOSCOPY;  Service: Gastroenterology;  Laterality: N/A;   ESOPHAGOGASTRODUODENOSCOPY (EGD) WITH PROPOFOL N/A 02/01/2021   Procedure:  ESOPHAGOGASTRODUODENOSCOPY (EGD) WITH PROPOFOL;  Surgeon: Meridee Score Netty Starring., MD;  Location: [facility] ENDOSCOPY;  Service: Gastroenterology;  Laterality: N/A;   GIVENS CAPSULE STUDY N/A 02/03/2021   Procedure: GIVENS CAPSULE STUDY;  Surgeon: Benancio Deeds, MD;  Location: [facility] ENDOSCOPY;  Service: Gastroenterology;  Laterality: N/A;   NASAL SEPTUM SURGERY     septal deviation, turbinate hypertrophy and obstruction   Social History   Tobacco Use   Smoking status: Never   Smokeless tobacco: Never   Tobacco comments:    pt notes he may have had 3-4 cigars yearly  Vaping Use   Vaping status: Never Used  Substance Use Topics   Alcohol use: Yes    Comment: occ   Drug use: No   Family History  Problem Relation Age of Onset   Hypertension Mother    Diabetes Brother    Colon cancer Neg Hx    Esophageal cancer Neg Hx    Pancreatic cancer Neg Hx    Stomach cancer Neg Hx    Liver disease Neg Hx    Hemochromatosis Neg Hx    Rectal cancer Neg Hx    Colon polyps Neg Hx    Allergies  Allergen Reactions   Doxycycline Other (See Comments)    Headaches      ROS Negative unless stated above    Objective:     BP 124/80 (BP Location: Left Arm, Patient Position: Sitting, Cuff Size: Large)   Pulse 77  Temp 98.4 F (36.9 C) (Oral)   Ht 5\' 11"  (1.803 m)   Wt 192 lb 12.8 oz (87.5 kg)   SpO2 97%   BMI 26.89 kg/m  BP Readings from Last 3 Encounters:  01/12/24 124/80  11/02/23 130/76  08/05/23 132/80   Wt Readings from Last 3 Encounters:  01/12/24 192 lb 12.8 oz (87.5 kg)  11/02/23 188 lb (85.3 kg)  08/05/23 184 lb 3.2 oz (83.6 kg)      Physical Exam Constitutional:      General: He is not in acute distress.    Appearance: Normal appearance.  HENT:     Head: Normocephalic and atraumatic.     Nose: Nose normal.     Mouth/Throat:     Mouth: Mucous membranes are moist.  Neck:     Vascular: Carotid bruit present.     Comments: L carotid artery bruit. Cardiovascular:      Rate and Rhythm: Normal rate and regular rhythm.     Heart sounds: Normal heart sounds. No murmur heard.    No gallop.  Pulmonary:     Effort: Pulmonary effort is normal. No respiratory distress.     Breath sounds: Normal breath sounds. No wheezing, rhonchi or rales.  Abdominal:     General: Bowel sounds are normal.     Palpations: Abdomen is soft.  Skin:    General: Skin is warm and dry.  Neurological:     Mental Status: He is alert and oriented to person, place, and time.      No results found for any visits on 01/12/24.    Assessment & Plan:  Essential hypertension -Controlled -Continue current medications including benazepril 20 mg daily, spironolactone 25 mg daily -Continue lifestyle modifications -Patient encouraged to check BP at home and bring the log to clinic. -     Spironolactone; Take 1 tablet (25 mg total) by mouth daily.  Dispense: 90 tablet; Refill: 3 -     Comprehensive metabolic panel -     TSH -     Lipid panel  Mixed hyperlipidemia -Continue lifestyle modifications -Continue follow-up with cardiology -     Lipid panel  Gastroesophageal reflux disease, unspecified whether esophagitis present -Avoid foods known to cause problems -For continued or worsening symptoms follow-up with GI -     Omeprazole; Take 1 capsule (20 mg total) by mouth daily.  Dispense: 90 capsule; Refill: 3 -     Comprehensive metabolic panel -     CBC with Differential/Platelet  Prediabetes -hemoglobin A1c 5.7% in 03/31/2023 -Lifestyle modifications -     Hemoglobin A1c  Elevated lipoprotein(a) -     Lipid panel  Lightheadedness -sleep study encouraged. -Continue follow-up with cardiology -     Lipid panel  Bruit of left carotid artery -Carotid artery ultrasound advised. -Tried to stress the importance of lifestyle modifications and medication compliance to help prevent possible complications of CVD -Continue follow-up with cardiology -     Lipid panel   Return in  about 6 months (around 07/11/2024) for blood pressure.   Deeann Saint, MD

## 2024-01-13 LAB — CBC WITH DIFFERENTIAL/PLATELET
Basophils Absolute: 0 10*3/uL (ref 0.0–0.1)
Basophils Relative: 0.6 % (ref 0.0–3.0)
Eosinophils Absolute: 0.1 10*3/uL (ref 0.0–0.7)
Eosinophils Relative: 1.6 % (ref 0.0–5.0)
HCT: 41.2 % (ref 39.0–52.0)
Hemoglobin: 14.1 g/dL (ref 13.0–17.0)
Lymphocytes Relative: 49.1 % — ABNORMAL HIGH (ref 12.0–46.0)
Lymphs Abs: 2.9 10*3/uL (ref 0.7–4.0)
MCHC: 34.1 g/dL (ref 30.0–36.0)
MCV: 96.3 fL (ref 78.0–100.0)
Monocytes Absolute: 0.5 10*3/uL (ref 0.1–1.0)
Monocytes Relative: 8.4 % (ref 3.0–12.0)
Neutro Abs: 2.4 10*3/uL (ref 1.4–7.7)
Neutrophils Relative %: 40.3 % — ABNORMAL LOW (ref 43.0–77.0)
Platelets: 261 10*3/uL (ref 150.0–400.0)
RBC: 4.27 Mil/uL (ref 4.22–5.81)
RDW: 13.9 % (ref 11.5–15.5)
WBC: 5.9 10*3/uL (ref 4.0–10.5)

## 2024-01-13 LAB — COMPREHENSIVE METABOLIC PANEL
ALT: 21 U/L (ref 0–53)
AST: 20 U/L (ref 0–37)
Albumin: 4.3 g/dL (ref 3.5–5.2)
Alkaline Phosphatase: 58 U/L (ref 39–117)
BUN: 16 mg/dL (ref 6–23)
CO2: 27 meq/L (ref 19–32)
Calcium: 9.3 mg/dL (ref 8.4–10.5)
Chloride: 102 meq/L (ref 96–112)
Creatinine, Ser: 1.15 mg/dL (ref 0.40–1.50)
GFR: 65.61 mL/min (ref >60.00)
Glucose, Bld: 74 mg/dL (ref 70–99)
Potassium: 3.9 meq/L (ref 3.5–5.1)
Sodium: 138 meq/L (ref 135–145)
Total Bilirubin: 0.5 mg/dL (ref 0.2–1.2)
Total Protein: 7.1 g/dL (ref 6.0–8.3)

## 2024-01-13 LAB — TSH: TSH: 1.32 u[IU]/mL (ref 0.35–5.50)

## 2024-01-13 LAB — LIPID PANEL
Cholesterol: 205 mg/dL — ABNORMAL HIGH (ref 0–200)
HDL: 33.5 mg/dL — ABNORMAL LOW (ref >39.00)
LDL Cholesterol: 140 mg/dL — ABNORMAL HIGH (ref 0–99)
NonHDL: 171.26
Total CHOL/HDL Ratio: 6
Triglycerides: 156 mg/dL — ABNORMAL HIGH (ref 0.0–149.0)
VLDL: 31.2 mg/dL (ref 0.0–40.0)

## 2024-01-13 LAB — HEMOGLOBIN A1C: Hgb A1c MFr Bld: 6.4 % (ref 4.6–6.5)

## 2024-01-17 ENCOUNTER — Other Ambulatory Visit: Payer: Self-pay

## 2024-01-17 MED ORDER — ROSUVASTATIN CALCIUM 5 MG PO TABS: 5.0000 mg | ORAL_TABLET | Freq: Every day | ORAL | 1 refills | Status: DC

## 2024-03-05 ENCOUNTER — Other Ambulatory Visit: Payer: Self-pay | Admitting: Family Medicine

## 2024-03-05 DIAGNOSIS — L209 Atopic dermatitis, unspecified: Secondary | ICD-10-CM

## 2024-03-06 ENCOUNTER — Other Ambulatory Visit: Payer: Self-pay | Admitting: Family Medicine

## 2024-03-06 DIAGNOSIS — L739 Follicular disorder, unspecified: Secondary | ICD-10-CM

## 2024-03-06 NOTE — Telephone Encounter (Signed)
 Copied from CRM [id number]. Topic: Clinical - Medication Refill >> Mar 06, 2024 10:06 AM Marica Otter wrote: Most Recent Primary Care Visit:  Provider: Deeann Saint  Department: [facility]  Visit Type: OFFICE VISIT  Date: 01/12/2024  Medication: triamcinolone cream (KENALOG) 0.1 %  Has the patient contacted their pharmacy? Yes, no refills (Agent: If no, request that the patient contact the pharmacy for the refill. If patient does not wish to contact the pharmacy document the reason why and proceed with request.) (Agent: If yes, when and what did the pharmacy advise?)  Is this the correct pharmacy for this prescription? Yes If no, delete pharmacy and type the correct one.  This is the patient's preferred pharmacy:  [id number] [place], [place] - [address] [place] [address] Phone: [phone] Fax: [phone]    Has the prescription been filled recently? No  Is the patient out of the medication? Yes  Has the patient been seen for an appointment in the last year OR does the patient have an upcoming appointment? Yes  Can we respond through MyChart? No  Agent: Please be advised that Rx refills may take up to 3 business days. We ask that you follow-up with your pharmacy.

## 2024-03-07 MED ORDER — TRIAMCINOLONE ACETONIDE 0.1 % EX CREA: TOPICAL_CREAM | CUTANEOUS | 2 refills | Status: AC | PRN

## 2024-03-31 ENCOUNTER — Ambulatory Visit: Admitting: Family Medicine

## 2024-03-31 ENCOUNTER — Ambulatory Visit: Payer: Self-pay

## 2024-03-31 NOTE — Telephone Encounter (Signed)
 Spoke with patient patient is going to see Ophthalmology.

## 2024-03-31 NOTE — Telephone Encounter (Signed)
  Chief Complaint: right eye symptoms Symptoms: mild pain, redness, upper eyelid swelling, watery/clear discharge Frequency: x 2 days Pertinent Negatives: Patient denies "sensation of foreign body", fever Disposition: [] ED /[] Urgent Care (no appt availability in office) / [x] Appointment(In office/virtual)/ []  [facility]/ [] Home Care/ [] Refused Recommended Disposition /[] [facility]/ []  Follow-up with PCP Additional Notes: Patient states he is unsure if it was concrete dust or bug spray, or if it was due to wiping his eye with dirty hands. He states he has used eye wash, no tears eye drops and the pain and sensation of foreign body improved. He states now he just has the mild pain, swelling, redness and watering of the eye.   Copied from CRM [id number]. Topic: Clinical - Red Word Triage >> Mar 31, 2024  8:04 AM Kathryne Eriksson wrote: Red Word that prompted transfer to Nurse Triage: Swollen / Redness >> Mar 31, 2024  8:05 AM Kathryne Eriksson wrote: Patient states he had an object to get inside his right eye 2 days ago, patient's eye is swollen, red and running / discharging.  Reason for Disposition  New or worsening blurred vision  Answer Assessment - Initial Assessment Questions 1. TYPE OF FOREIGN BODY: "What got in the eye?"      Unsure, possible sheet rock, concrete or bug spray.  2. ONSET: "When did it happen?"      03/29/24.  3. MECHANISM: "How did it happen?"      Patient states he was opening some concrete bags and dust blew everywhere. He states he was sanding on some sheet rock and did not have protective eyewear on. He states he was also spraying some bees with bug spray. He states it gradually became severe over the time span of the day on 03/29/24. 4. VISION: "Do you have blurred vision?"      Occasionally.  5. PAIN: "Is it painful?" If Yes, ask: "How bad is the pain?"  (Scale 1-10; or mild, moderate, severe)     Yes, mild.  6. CONTACTS: "Do you wear contacts?"      Denies.  7. OTHER SYMPTOMS: "Do you have any other symptoms?"     Redness to eye, upper eyelid swelling, watery/clear discharge.  8. PREGNANCY: "Is there any chance you are pregnant?" "When was your last menstrual period?"     N/A.  Protocols used: Eye - Foreign Body-A-AH, Eye - Red Without Pus-A-AH

## 2024-04-12 ENCOUNTER — Other Ambulatory Visit: Payer: Self-pay | Admitting: Family Medicine

## 2024-04-12 DIAGNOSIS — I10 Essential (primary) hypertension: Secondary | ICD-10-CM

## 2024-04-24 ENCOUNTER — Ambulatory Visit (INDEPENDENT_AMBULATORY_CARE_PROVIDER_SITE_OTHER): Payer: Medicare Other | Admitting: Family Medicine

## 2024-04-24 ENCOUNTER — Encounter: Payer: Self-pay | Admitting: Family Medicine

## 2024-04-24 VITALS — BP 146/80 | HR 63 | Temp 98.3°F | Ht 71.0 in | Wt 193.8 lb

## 2024-04-24 DIAGNOSIS — L209 Atopic dermatitis, unspecified: Secondary | ICD-10-CM

## 2024-04-24 DIAGNOSIS — K219 Gastro-esophageal reflux disease without esophagitis: Secondary | ICD-10-CM | POA: Diagnosis not present

## 2024-04-24 DIAGNOSIS — E782 Mixed hyperlipidemia: Secondary | ICD-10-CM

## 2024-04-24 DIAGNOSIS — I10 Essential (primary) hypertension: Secondary | ICD-10-CM | POA: Diagnosis not present

## 2024-04-24 LAB — LIPID PANEL
Cholesterol: 156 mg/dL (ref 0–200)
HDL: 37.2 mg/dL — ABNORMAL LOW (ref >39.00)
LDL Cholesterol: 105 mg/dL — ABNORMAL HIGH (ref 0–99)
NonHDL: 118.82
Total CHOL/HDL Ratio: 4
Triglycerides: 70 mg/dL (ref 0.0–149.0)
VLDL: 14 mg/dL (ref 0.0–40.0)

## 2024-04-24 NOTE — Progress Notes (Signed)
 Established Patient Office Visit   Subjective  Patient ID: Jay Hyke., male    DOB: 02/02/1956  Age: 68 y.o. MRN: [id number]  Chief Complaint  Patient presents with   Follow-up    42month follow-up cholesterol recheck,    Medication Problem    Omeprazole  has been causing a rash on patient's arms, possible side effect     Patient is a 68 year old male seen for follow-up/repeat labs.  Patient had coffee with nondairy powder creamer this morning.  Takes BP meds in the evening.  Patient states he stopped taking omeprazole  daily and takes as needed after reading it may cause rash.  Patient thinks rash on bilateral forearms was from the medication.  Rash thought secondary to eczema, using triamcinolone  which has caused improvement.  Requesting refill..  Pt canceled dermatology appointment.  Patient mentions hearing himself wheeze at night when laying in bed.  Denies coughing, sneezing, postnasal drainage, rhinorrhea.  Taking Allegra daily for allergies.  Does not hear himself when he is laying on the couch watching TV shortly after eating.    Patient Active Problem List   Diagnosis Date Noted   History of Helicobacter infection 03/20/2021   Hx of colonic polyps 03/20/2021   History of lower GI bleeding 03/20/2021   History of gastric ulcer 03/20/2021   Diverticulosis of large intestine with hemorrhage    GI bleed 01/30/2021   Acute lower GI bleeding 01/29/2021   Syncope 01/29/2021   Pure hypercholesterolemia 10/16/2010   Allergic rhinitis 10/24/2007   Essential hypertension 07/25/2007   Past Medical History:  Diagnosis Date   ALLERGIC RHINITIS 10/24/2007   Blood transfusion without reported diagnosis 01/2021   Gastric ulcer    hx of gastric ulcer   GI bleed 01/2021   H. pylori infection    treated   Heart murmur    many years ago- no issues in the 1990's   HYPERLIPIDEMIA 10/16/2010   HYPERTENSION 07/25/2007   Past Surgical History:  Procedure Laterality Date    APPENDECTOMY     BIOPSY  02/01/2021   Procedure: BIOPSY;  Surgeon: Normie Becton., MD;  Location: [facility] ENDOSCOPY;  Service: Gastroenterology;;   CLAVICLE SURGERY     possibly as a child? unknown if surgery- broke collarbone   COLONOSCOPY WITH PROPOFOL  N/A 02/01/2021   Procedure: COLONOSCOPY WITH PROPOFOL ;  Surgeon: Normie Becton., MD;  Location: [facility] ENDOSCOPY;  Service: Gastroenterology;  Laterality: N/A;   ESOPHAGOGASTRODUODENOSCOPY (EGD) WITH PROPOFOL  N/A 02/01/2021   Procedure: ESOPHAGOGASTRODUODENOSCOPY (EGD) WITH PROPOFOL ;  Surgeon: Brice Campi Albino Alu., MD;  Location: [facility] ENDOSCOPY;  Service: Gastroenterology;  Laterality: N/A;   GIVENS CAPSULE STUDY N/A 02/03/2021   Procedure: GIVENS CAPSULE STUDY;  Surgeon: Ace Holder, MD;  Location: [facility] ENDOSCOPY;  Service: Gastroenterology;  Laterality: N/A;   NASAL SEPTUM SURGERY     septal deviation, turbinate hypertrophy and obstruction   Social History   Tobacco Use   Smoking status: Never   Smokeless tobacco: Never   Tobacco comments:    pt notes he may have had 3-4 cigars yearly  Vaping Use   Vaping status: Never Used  Substance Use Topics   Alcohol use: Yes    Comment: occ   Drug use: No   Family History  Problem Relation Age of Onset   Hypertension Mother    Diabetes Brother    Colon cancer Neg Hx    Esophageal cancer Neg Hx    Pancreatic cancer Neg Hx    Stomach cancer  Neg Hx    Liver disease Neg Hx    Hemochromatosis Neg Hx    Rectal cancer Neg Hx    Colon polyps Neg Hx    Allergies  Allergen Reactions   Doxycycline  Other (See Comments)    Headaches      ROS Negative unless stated above    Objective:     BP (!) 160/78 (BP Location: Left Arm, Patient Position: Sitting, Cuff Size: Normal) Comment: Blood pressure meds not taken  Pulse 63   Temp 98.3 F (36.8 C) (Oral)   Ht 5\' 11"  (1.803 m)   Wt 193 lb 12.8 oz (87.9 kg)   SpO2 98%   BMI 27.03 kg/m  BP Readings from Last 3 Encounters:   04/24/24 (!) 160/78  01/12/24 124/80  11/02/23 130/76   Wt Readings from Last 3 Encounters:  04/24/24 193 lb 12.8 oz (87.9 kg)  01/12/24 192 lb 12.8 oz (87.5 kg)  11/02/23 188 lb (85.3 kg)      Physical Exam Constitutional:      General: He is not in acute distress.    Appearance: Normal appearance.  HENT:     Head: Normocephalic and atraumatic.     Nose: Nose normal.     Mouth/Throat:     Mouth: Mucous membranes are moist.  Cardiovascular:     Rate and Rhythm: Normal rate and regular rhythm.     Heart sounds: Normal heart sounds. No murmur heard.    No gallop.  Pulmonary:     Effort: Pulmonary effort is normal. No respiratory distress.     Breath sounds: Normal breath sounds. No wheezing, rhonchi or rales.  Skin:    General: Skin is warm and dry.     Comments: Dry appearing skin on bilateral forearms  Neurological:     Mental Status: He is alert and oriented to person, place, and time.      No results found for any visits on 04/24/24.    Assessment & Plan:  Mixed hyperlipidemia -     Lipid panel; Future  Atopic dermatitis, unspecified type  Essential hypertension  Gastroesophageal reflux disease, unspecified whether esophagitis present  Recheck lipid panel as total cholesterol 205, HDL 33.5, LDL 140, triglycerides 156 on 01/12/2024.  Continue Crestor  5 mg daily.  Adjust if needed based on results.  Continue lifestyle modifications  Patient requested refill on triamcinolone  cream however has refills.  Advised to contact pharmacy.  Advised to reschedule dermatology appointment for likely eczema on bilateral forearms.  Diet modifications strongly encouraged such as decreasing coffee consumption to help with GERD symptoms.  No longer taking omeprazole  daily.  BP elevated this visit.  Recheck.  Previously well-controlled.  Continue current medications including benazepril  1 mg daily and spironolactone  25 mg daily.  Return in about 4 months (around 08/24/2024), or  if symptoms worsen or fail to improve, for chronic conditions, blood pressure.   Viola Greulich, MD

## 2024-04-24 NOTE — Patient Instructions (Signed)
 The triamcinolone  cream that you were requesting a refill on has several refills remaining.  Contact your pharmacy to have these filled.  I think you should reschedule your appointment with dermatology even though you feel the rash is improving.

## 2024-04-28 ENCOUNTER — Other Ambulatory Visit: Payer: Self-pay | Admitting: Family Medicine

## 2024-04-28 DIAGNOSIS — L209 Atopic dermatitis, unspecified: Secondary | ICD-10-CM

## 2024-06-08 ENCOUNTER — Ambulatory Visit: Admitting: Family Medicine

## 2024-06-09 ENCOUNTER — Encounter: Payer: Self-pay | Admitting: Family Medicine

## 2024-07-28 ENCOUNTER — Ambulatory Visit: Payer: Self-pay

## 2024-07-28 NOTE — Telephone Encounter (Signed)
 FYI Only or Action Required?: FYI only for provider.  Patient was last seen in primary care on 04/24/2024 by Mercer Clotilda SAUNDERS, MD.  Called Nurse Triage reporting Hematuria.  Symptoms began yesterday.  Interventions attempted: Nothing.  Symptoms are: unchanged.  Triage Disposition: See Physician Within 24 Hours  Patient/caregiver understands and will follow disposition?: Yes with modifications, appointment 8/4      Copied from CRM #[id number]. Topic: Clinical - Red Word Triage >> Jul 28, 2024 10:00 AM Jay Robertson wrote: Red Word that prompted transfer to Nurse Triage: Possible UTI .Jay Robertson Blood in urine, frequent urination      Reason for Disposition  Blood in urine  (Exception: Could be normal menstrual bleeding.)  Answer Assessment - Initial Assessment Questions Patient reports minor blood on toilet paper after urinating and wiping. Appointment for 8/4, patient advised to go to urgent care over the weekend for new or worsening symptoms, which he is agreeable with.         1. COLOR of URINE: Describe the color of the urine.  (e.g., tea-colored, pink, red, bloody) Do you have blood clots in your urine? (e.g., none, pea, grape, small coin)     Small amount on toilet paper after urinating  2. ONSET: When did the bleeding start?      Yesterday  3. EPISODES: How many times has there been blood in the urine? or How many times today?     Unsure  4. PAIN with URINATION: Is there any pain with passing your urine? If Yes, ask: How bad is the pain?  (Scale 1-10; or mild, moderate, severe)     No 5. FEVER: Do you have a fever? If Yes, ask: What is your temperature, how was it measured, and when did it start?     No 6. ASSOCIATED SYMPTOMS: Are you passing urine more frequently than usual?     Yes, increased urinary frequency  7. OTHER SYMPTOMS: Do you have any other symptoms? (e.g., back/flank pain, abdomen pain, vomiting)     No  Protocols used: Urine - Blood  In-A-AH

## 2024-07-28 NOTE — Telephone Encounter (Signed)
Patient has appt 8/4.

## 2024-07-30 ENCOUNTER — Encounter (HOSPITAL_COMMUNITY): Payer: Self-pay | Admitting: Pharmacy Technician

## 2024-07-30 ENCOUNTER — Emergency Department (HOSPITAL_COMMUNITY)

## 2024-07-30 ENCOUNTER — Emergency Department (HOSPITAL_COMMUNITY)
Admission: EM | Admit: 2024-07-30 | Discharge: 2024-07-30 | Disposition: A | Discharge status: Home / Self Care | Attending: Student in an Organized Health Care Education/Training Program | Admitting: Student in an Organized Health Care Education/Training Program

## 2024-07-30 ENCOUNTER — Other Ambulatory Visit: Payer: Self-pay

## 2024-07-30 DIAGNOSIS — Z79899 Other long term (current) drug therapy: Secondary | ICD-10-CM | POA: Insufficient documentation

## 2024-07-30 DIAGNOSIS — R109 Unspecified abdominal pain: Secondary | ICD-10-CM | POA: Diagnosis present

## 2024-07-30 DIAGNOSIS — E871 Hypo-osmolality and hyponatremia: Secondary | ICD-10-CM | POA: Diagnosis not present

## 2024-07-30 DIAGNOSIS — I10 Essential (primary) hypertension: Secondary | ICD-10-CM | POA: Diagnosis not present

## 2024-07-30 DIAGNOSIS — N3001 Acute cystitis with hematuria: Secondary | ICD-10-CM | POA: Insufficient documentation

## 2024-07-30 LAB — COMPREHENSIVE METABOLIC PANEL WITH GFR
ALT: 29 U/L (ref 0–44)
AST: 22 U/L (ref 15–41)
Albumin: 3.6 g/dL (ref 3.5–5.0)
Alkaline Phosphatase: 62 U/L (ref 38–126)
Anion gap: 11 (ref 5–15)
BUN: 11 mg/dL (ref 8–23)
CO2: 20 mmol/L — ABNORMAL LOW (ref 22–32)
Calcium: 9.1 mg/dL (ref 8.9–10.3)
Chloride: 101 mmol/L (ref 98–111)
Creatinine, Ser: 0.94 mg/dL (ref 0.61–1.24)
GFR, Estimated: >60 mL/min (ref >60)
Glucose, Bld: 111 mg/dL — ABNORMAL HIGH (ref 70–99)
Potassium: 4 mmol/L (ref 3.5–5.1)
Sodium: 132 mmol/L — ABNORMAL LOW (ref 135–145)
Total Bilirubin: 1.1 mg/dL (ref 0.0–1.2)
Total Protein: 7.4 g/dL (ref 6.5–8.1)

## 2024-07-30 LAB — CBC WITH DIFFERENTIAL/PLATELET
Abs Immature Granulocytes: 0.04 K/uL (ref 0.00–0.07)
Basophils Absolute: 0 K/uL (ref 0.0–0.1)
Basophils Relative: 0 %
Eosinophils Absolute: 0 K/uL (ref 0.0–0.5)
Eosinophils Relative: 0 %
HCT: 40.3 % (ref 39.0–52.0)
Hemoglobin: 13.9 g/dL (ref 13.0–17.0)
Immature Granulocytes: 0 %
Lymphocytes Relative: 20 %
Lymphs Abs: 2.5 K/uL (ref 0.7–4.0)
MCH: 32.5 pg (ref 26.0–34.0)
MCHC: 34.5 g/dL (ref 30.0–36.0)
MCV: 94.2 fL (ref 80.0–100.0)
Monocytes Absolute: 1.1 K/uL — ABNORMAL HIGH (ref 0.1–1.0)
Monocytes Relative: 8 %
Neutro Abs: 8.9 K/uL — ABNORMAL HIGH (ref 1.7–7.7)
Neutrophils Relative %: 72 %
Platelets: 240 K/uL (ref 150–400)
RBC: 4.28 MIL/uL (ref 4.22–5.81)
RDW: 13.9 % (ref 11.5–15.5)
WBC: 12.6 K/uL — ABNORMAL HIGH (ref 4.0–10.5)
nRBC: 0 % (ref 0.0–0.2)

## 2024-07-30 LAB — URINALYSIS, ROUTINE W REFLEX MICROSCOPIC
Bilirubin Urine: NEGATIVE
Glucose, UA: NEGATIVE mg/dL
Ketones, ur: 5 mg/dL — AB
Nitrite: POSITIVE — AB
Protein, ur: 30 mg/dL — AB
Specific Gravity, Urine: 1.019 (ref 1.005–1.030)
WBC, UA: >50 WBC/hpf (ref 0–5)
pH: 6 (ref 5.0–8.0)

## 2024-07-30 LAB — LIPASE, BLOOD: Lipase: 20 U/L (ref 11–51)

## 2024-07-30 LAB — I-STAT CG4 LACTIC ACID, ED: Lactic Acid, Venous: 1.6 mmol/L (ref 0.5–1.9)

## 2024-07-30 MED ORDER — CEPHALEXIN 250 MG PO CAPS
500.0000 mg | ORAL_CAPSULE | Freq: Once | ORAL | Status: AC
  Administered 2024-07-30: 500 mg via ORAL
  Filled 2024-07-30: qty 2

## 2024-07-30 MED ORDER — IOHEXOL 350 MG/ML SOLN
100.0000 mL | Freq: Once | INTRAVENOUS | Status: AC | PRN
  Administered 2024-07-30: 100 mL via INTRAVENOUS

## 2024-07-30 MED ORDER — LACTATED RINGERS IV BOLUS
1000.0000 mL | Freq: Once | INTRAVENOUS | Status: AC
  Administered 2024-07-30: 1000 mL via INTRAVENOUS

## 2024-07-30 MED ORDER — CEPHALEXIN 500 MG PO CAPS
500.0000 mg | ORAL_CAPSULE | Freq: Three times a day (TID) | ORAL | 0 refills | Status: AC
Start: 2024-07-30 — End: 2024-08-06

## 2024-07-30 MED ORDER — MORPHINE SULFATE (PF) 4 MG/ML IV SOLN
4.0000 mg | Freq: Once | INTRAVENOUS | Status: AC
  Administered 2024-07-30: 4 mg via INTRAVENOUS
  Filled 2024-07-30: qty 1

## 2024-07-30 MED ORDER — PANTOPRAZOLE SODIUM 40 MG IV SOLR
40.0000 mg | Freq: Once | INTRAVENOUS | Status: AC
  Administered 2024-07-30: 40 mg via INTRAVENOUS
  Filled 2024-07-30: qty 10

## 2024-07-30 MED ORDER — FAMOTIDINE 20 MG PO TABS: 20.0000 mg | ORAL_TABLET | Freq: Two times a day (BID) | ORAL | 0 refills | Status: DC

## 2024-07-30 NOTE — Discharge Instructions (Addendum)
 Your workup today did show evidence of a urinary tract infection.  Please continue taking Keflex  as prescribed.  We have also sent Pepcid  to the pharmacy to take over the next week.  Return to the emergency department if you have any worsening discomfort or develop any further symptoms.

## 2024-07-30 NOTE — ED Notes (Signed)
 Paper work reviewed with pt. No questions at this time. No new onset distress at this time.

## 2024-07-30 NOTE — ED Triage Notes (Signed)
 Pt here with reports of black stool today. Saturday pt with R flank pain, today complaining of L flank pain. Decreased appetite. Hx GI bleed in the past.

## 2024-07-30 NOTE — ED Provider Notes (Signed)
 [place] EMERGENCY DEPARTMENT AT [facility] Provider Note   CSN: [id number] Arrival date & time: 07/30/24  1048     Patient presents with: Abdominal Pain   Jay Robertson. is a 68 y.o. male.   68 year old male presenting to the emergency department with concern regarding a GI bleed.  He reports 2 days of abdominal pain and woke up this morning noticing dark-colored stools.  He reports right-sided flank and abdominal pain yesterday but reports the pain is now on the left side today.  He denies any blood thinners.  He reports a prior history of a GI bleed back in 2022 but states they were unable to find a source of the bleed. Review of the discharge summary shows multiple CTAs that were negative and an upper endoscopy with colonoscopy that showed no evidence of bleeding in the upper GI system but did note blood throughout the large intestine with diverticulosis.  He did receive 10 units of PRBCs transfused during that hospital stay due to his large-volume hematochezia.  It was recommended during the hospital stay that he have a mesenteric angiogram with IR, however the patient refused any further workup at that time since his bleeding was improving.  Patient reports that he has not had any further concerns for GI bleed since 2022. He has recently stopped taking his omeprazole .  He has a past medical history of GERD and hypertension. Patient reports that his last colonoscopy was around 2 years ago.  He denies any chest pain, dizziness, or palpitations.  He denies alcohol use or NSAID use.  His abdominal pain is currently located in the left lower quadrant.  He reports the 1 episode of blood in his urine last week and states he was evaluated at an outside hospital.  He reports a kidney scan scheduled for tomorrow to evaluate for any kidney stones.  Patient does admit to taking Pepto-Bismol yesterday for his abdominal discomfort.   Abdominal Pain      Prior to Admission medications    Medication Sig Start Date End Date Taking? Authorizing Provider  cephALEXin  (KEFLEX ) 500 MG capsule Take 1 capsule (500 mg total) by mouth 3 (three) times daily for 7 days. 07/30/24 08/06/24 Yes Sharyon Peitz, DO  famotidine  (PEPCID ) 20 MG tablet Take 1 tablet (20 mg total) by mouth 2 (two) times daily for 7 days. 07/30/24 08/06/24 Yes Lochlin Eppinger, DO  benazepril  (LOTENSIN ) 20 MG tablet TAKE 1 TABLET BY MOUTH EVERY DAY 04/12/24   Mercer Clotilda SAUNDERS, MD  Fexofenadine-Pseudoephedrine (ALLEGRA-D 24 HOUR PO) Take 1 tablet by mouth as needed.    [provider]  omeprazole  (PRILOSEC) 20 MG capsule Take 1 capsule (20 mg total) by mouth daily. 01/12/24   Mercer Clotilda SAUNDERS, MD  rosuvastatin  (CRESTOR ) 5 MG tablet Take 1 tablet (5 mg total) by mouth daily. 01/17/24   Mercer Clotilda SAUNDERS, MD  sildenafil  (VIAGRA ) 100 MG tablet Take 100 mg by mouth daily as needed for erectile dysfunction.    [provider]  spironolactone  (ALDACTONE ) 25 MG tablet Take 1 tablet (25 mg total) by mouth daily. 01/12/24   Mercer Clotilda SAUNDERS, MD  triamcinolone  cream (KENALOG ) 0.1 % Apply topically as needed. 03/07/24   Mercer Clotilda SAUNDERS, MD  triamcinolone  ointment (KENALOG ) 0.5 % Apply 1 Application topically 2 (two) times daily. 06/09/23   Mercer Clotilda SAUNDERS, MD    Allergies: Doxycycline     Review of Systems  Gastrointestinal:  Positive for abdominal pain.  All other systems  reviewed and are negative.   Updated Vital Signs BP 126/81 (BP Location: Left Arm)   Pulse 82   Temp 98.2 F (36.8 C) (Oral)   Resp 18   Ht 5' 11 (1.803 m)   Wt 83.5 kg   SpO2 100%   BMI 25.66 kg/m   Physical Exam Vitals and nursing note reviewed.  Constitutional:      General: He is not in acute distress.    Appearance: He is well-developed.  HENT:     Head: Normocephalic and atraumatic.  Eyes:     Conjunctiva/sclera: Conjunctivae normal.  Cardiovascular:     Rate and Rhythm: Normal rate and regular rhythm.     Heart sounds: No murmur  heard. Pulmonary:     Effort: Pulmonary effort is normal. No respiratory distress.     Breath sounds: Normal breath sounds.  Abdominal:     Palpations: Abdomen is soft.     Tenderness: There is abdominal tenderness in the left lower quadrant. There is no right CVA tenderness or left CVA tenderness.  Musculoskeletal:        General: No swelling.     Cervical back: Neck supple.  Skin:    General: Skin is warm and dry.     Capillary Refill: Capillary refill takes less than 2 seconds.  Neurological:     Mental Status: He is alert.  Psychiatric:        Mood and Affect: Mood normal.     (all labs ordered are listed, but only abnormal results are displayed) Labs Reviewed  COMPREHENSIVE METABOLIC PANEL WITH GFR - Abnormal; Notable for the following components:      Result Value   Sodium 132 (*)    CO2 20 (*)    Glucose, Bld 111 (*)    All other components within normal limits  CBC WITH DIFFERENTIAL/PLATELET - Abnormal; Notable for the following components:   WBC 12.6 (*)    Neutro Abs 8.9 (*)    Monocytes Absolute 1.1 (*)    All other components within normal limits  URINALYSIS, ROUTINE W REFLEX MICROSCOPIC - Abnormal; Notable for the following components:   APPearance CLOUDY (*)    Hgb urine dipstick LARGE (*)    Ketones, ur 5 (*)    Protein, ur 30 (*)    Nitrite POSITIVE (*)    Leukocytes,Ua LARGE (*)    Bacteria, UA RARE (*)    All other components within normal limits  LIPASE, BLOOD  I-STAT CG4 LACTIC ACID, ED  POC OCCULT BLOOD, ED    EKG: None  Radiology: CT ANGIO GI BLEED Result Date: 07/30/2024 CLINICAL DATA:  abd pain and dark stools EXAM: CTA ABDOMEN AND PELVIS WITHOUT AND WITH CONTRAST TECHNIQUE: Multidetector CT imaging of the abdomen and pelvis was performed using the standard protocol during bolus administration of intravenous contrast. Multiplanar reconstructed images and MIPs were obtained and reviewed to evaluate the vascular anatomy. RADIATION DOSE  REDUCTION: This exam was performed according to the departmental dose-optimization program which includes automated exposure control, adjustment of the mA and/or kV according to patient size and/or use of iterative reconstruction technique. CONTRAST:  OMNIPAQUE  IOHEXOL  350 MG/ML SOLN COMPARISON:  None Available. FINDINGS: VASCULAR No extravasation of intravenous contrast within the lumen of the small or large bowel. Aorta: Mild atherosclerotic plaque. Normal caliber aorta without aneurysm, dissection, vasculitis or significant stenosis. Celiac: Patent without evidence of aneurysm, dissection, vasculitis or significant stenosis. SMA: Patent without evidence of aneurysm, dissection, vasculitis or significant stenosis.  Renals: Both renal arteries are patent without evidence of aneurysm, dissection, vasculitis, fibromuscular dysplasia or significant stenosis. IMA: Patent without evidence of aneurysm, dissection, vasculitis or significant stenosis. Inflow: Mild atherosclerotic plaque. Patent without evidence of aneurysm, dissection, vasculitis or significant stenosis. Proximal Outflow: Bilateral common femoral and visualized portions of the superficial and profunda femoral arteries are patent without evidence of aneurysm, dissection, vasculitis or significant stenosis. Veins: No obvious venous abnormality within the limitations of this arterial phase study. Review of the MIP images confirms the above findings. NON-VASCULAR Lower chest: Left base atelectasis.  No acute abnormality. Hepatobiliary: No focal liver abnormality. No gallstones, gallbladder wall thickening, or pericholecystic fluid. No biliary dilatation. Pancreas: No focal lesion. Normal pancreatic contour. No surrounding inflammatory changes. No main pancreatic ductal dilatation. Spleen: Normal in size without focal abnormality. Adrenals/Urinary Tract: No adrenal nodule bilaterally. Bilateral kidneys enhance symmetrically. Bilateral extrarenal pelvis  ease. Bilateral urothelial thickening. Fluid dense lesion of the left kidney likely represents a simple renal cyst. Simple renal cysts, in the absence of clinically indicated signs/symptoms, require no independent follow-up. Subcentimeter hypodensities are too small to characterize-no further follow-up indicated. No hydronephrosis. No hydroureter. Circumferential urinary bladder wall thickening. Stomach/Bowel: Stomach is within normal limits. No evidence of bowel wall thickening or dilatation. The appendix is not definitely identified with no inflammatory changes in the right lower quadrant to suggest acute appendicitis. Lymphatic: No lymphadenopathy. Reproductive: Prostate is unremarkable. Other: No intraperitoneal free fluid. No intraperitoneal free gas. No organized fluid collection. Musculoskeletal: No abdominal wall hernia or abnormality. No suspicious lytic or blastic osseous lesions. No acute displaced fracture. L5-S1 disc bulge. IMPRESSION: VASCULAR 1. No CT evidence of active gastrointestinal hemorrhage. 2. No acute abdominal aorta abnormality. 3.  Aortic Atherosclerosis (ICD10-I70.0). NON-VASCULAR 1. Findings suggestive of urinary tract infection. Recommend correlation with urinalysis. 2. Colonic diverticulosis with no acute diverticulitis. Electronically Signed   By: Morgane  Naveau M.D.   On: 07/30/2024 13:54     Procedures   Medications Ordered in the ED  cephALEXin  (KEFLEX ) capsule 500 mg (has no administration in time range)  pantoprazole  (PROTONIX ) injection 40 mg (40 mg Intravenous Given 07/30/24 1203)  morphine  (PF) 4 MG/ML injection 4 mg (4 mg Intravenous Given 07/30/24 1203)  lactated ringers  bolus 1,000 mL (1,000 mLs Intravenous New Bag/Given 07/30/24 1203)  iohexol  (OMNIPAQUE ) 350 MG/ML injection 100 mL (100 mLs Intravenous Contrast Given 07/30/24 1324)    Clinical Course as of 07/30/24 1507  Sun Jul 30, 2024  1358 IMPRESSION: VASCULAR  1. No CT evidence of active gastrointestinal  hemorrhage. 2. No acute abdominal aorta abnormality. 3.  Aortic Atherosclerosis (ICD10-I70.0).  NON-VASCULAR  1. Findings suggestive of urinary tract infection. Recommend correlation with urinalysis. 2. Colonic diverticulosis with no acute diverticulitis.  [AL]    Clinical Course User Index [AL] Garnie Borchardt, DO                                 Medical Decision Making This patient presents to the ED for concern of dark colored stool. This complaint can involve an extensive number of treatment options and could carry with it a risk of complications (including morbidity). The differential diagnosis includes but not limited to GI bleed, Pepto-Bismol ingestion, gastritis, gastric ulcer, and others.  External records from outside source obtained if available and applicable  Reviewed any available information if applicable including prior office visits, ED visits, or hospitalizations.   Lab Tests: I Ordered, and  personally interpreted labs.   The pertinent results include: Hemoglobin stable and within normal limits.  Mild hyponatremia noted at 132. UA positive for nitrates, white blood cells, red blood cells, and leukocyte esterase.  Imaging Studies ordered: I ordered imaging studies including CTA abdomen and pelvis I independently visualized and I agree with the radiologist interpretation. Imaging results: No significant intra-abdominal abnormality.  No findings consistent with a GI bleed.  Mild cystitis noted  Problem List / ED Course:      Abdominal pain, dark-colored stool Lab work and imaging did not show any evidence of an acute GI bleed.  We did attempt to do a fecal occult test, however he did not have any stool in the rectum that we were able to test.  No evidence of active bleeding on exam.  CTA does show diverticulosis without diverticulitis.  CT suggested evidence of cystitis.  Urinalysis also shows evidence of a urinary tract infection.  I believe this is the cause of his  abdominal pain at this time.  He is well-appearing on reexamination.  We will start him on Keflex  and Pepcid . I do suspect that his 1 dark-colored stool could be secondary to the Pepto-Bismol he was taking yesterday.  He does have a past medical history of a GI bleed previously, however he does not have any active bleeding and only had 1 stool this morning that was concerning.  We do not have any acute intra-abdominal abnormalities seen on imaging that could explain some mild GI bleeding.   Reevaluation: After the interventions noted above, I reevaluated the patient and found that they have :improved  Dispostion: After consideration of diagnostic results and the patient's current presentation, it was determined that the patient was stable for DC.  Return precautions were discussed.  All questions answered.   Amount and/or Complexity of Data Reviewed Labs: ordered. Radiology: ordered.  Risk Prescription drug management.     Final diagnoses:  Acute cystitis with hematuria  Abdominal pain, unspecified abdominal location    ED Discharge Orders          Ordered    cephALEXin  (KEFLEX ) 500 MG capsule  3 times daily        07/30/24 1505    famotidine  (PEPCID ) 20 MG tablet  2 times daily        07/30/24 1505               Brooklynne Pereida, DO 07/30/24 1507

## 2024-07-31 ENCOUNTER — Encounter: Payer: Self-pay | Admitting: Family Medicine

## 2024-07-31 ENCOUNTER — Ambulatory Visit (INDEPENDENT_AMBULATORY_CARE_PROVIDER_SITE_OTHER): Admitting: Family Medicine

## 2024-07-31 VITALS — BP 138/84 | HR 72 | Temp 98.5°F | Ht 71.0 in | Wt 185.6 lb

## 2024-07-31 DIAGNOSIS — R300 Dysuria: Secondary | ICD-10-CM

## 2024-07-31 DIAGNOSIS — N3001 Acute cystitis with hematuria: Secondary | ICD-10-CM

## 2024-08-04 ENCOUNTER — Telehealth: Payer: Self-pay

## 2024-08-04 NOTE — Transitions of Care (Post Inpatient/ED Visit) (Signed)
   08/04/2024  Name: Jay MOLDEN Sr. MRN: [id number] DOB: 1956/11/12  Today's TOC FU Call Status: Today's TOC FU Call Status:: Successful TOC FU Call Completed TOC FU Call Complete Date: 08/04/24 Patient's Name and Date of Birth confirmed.  Transition Care Management Follow-up Telephone Call Date of Discharge: 07/30/24 Discharge Facility: [facility]) Type of Discharge: Emergency Department Reason for ED Visit: Other: (blood in urine, back pain) How have you been since you were released from the hospital?: Better Any questions or concerns?: No  Items Reviewed: Did you receive and understand the discharge instructions provided?: Yes Medications obtained,verified, and reconciled?: Yes (Medications Reviewed) Any new allergies since your discharge?: No Dietary orders reviewed?: NA Do you have support at home?: Yes  Medications Reviewed Today: Medications Reviewed Today     Reviewed by Bridie Colquhoun, CMA (Certified Medical Assistant) on 08/04/24 at 1202  Med List Status: <None>   Medication Order Taking? Sig Documenting Provider Last Dose Status Informant  benazepril  (LOTENSIN ) 20 MG tablet [id number] Yes TAKE 1 TABLET BY MOUTH EVERY DAY Mercer Clotilda SAUNDERS, MD  Active   cephALEXin  (KEFLEX ) 500 MG capsule [id number] Yes Take 1 capsule (500 mg total) by mouth 3 (three) times daily for 7 days. Lowther, Amy, DO  Active   famotidine  (PEPCID ) 20 MG tablet [id number] Yes Take 1 tablet (20 mg total) by mouth 2 (two) times daily for 7 days. Lowther, Amy, DO  Active   Fexofenadine-Pseudoephedrine (ALLEGRA-D 24 HOUR PO) [id number] Yes Take 1 tablet by mouth as needed. [provider]  Active   omeprazole  (PRILOSEC) 20 MG capsule [id number] Yes Take 1 capsule (20 mg total) by mouth daily. Mercer Clotilda SAUNDERS, MD  Active   rosuvastatin  (CRESTOR ) 5 MG tablet [id number] Yes Take 1 tablet (5 mg total) by mouth daily. Mercer Clotilda SAUNDERS, MD  Active   sildenafil  (VIAGRA ) 100 MG tablet [id number] Yes Take 100  mg by mouth daily as needed for erectile dysfunction. [provider]  Active   spironolactone  (ALDACTONE ) 25 MG tablet [id number] Yes Take 1 tablet (25 mg total) by mouth daily. Mercer Clotilda SAUNDERS, MD  Active   triamcinolone  cream (KENALOG ) 0.1 % [id number] Yes Apply topically as needed. Mercer Clotilda SAUNDERS, MD  Active   triamcinolone  ointment (KENALOG ) 0.5 % [id number]  Apply 1 Application topically 2 (two) times daily.  Patient not taking: Reported on 08/04/2024   Mercer Clotilda SAUNDERS, MD  Active             Home Care and Equipment/Supplies:    Functional Questionnaire:    Follow up appointments reviewed: PCP Follow-up appointment confirmed?: No (pt declined appt at this time and states has another dr appt on 8/22 too close to the appt. pt states he will call back to schedule an appointment.)    SIGNATURE: Yariela Tison, CMA

## 2024-08-06 NOTE — Progress Notes (Signed)
 Established Patient Office Visit   Subjective  Patient ID: Jay Colaizzi., male    DOB: 1956/10/31  Age: 68 y.o. MRN: [id number]  Chief Complaint  Patient presents with   Acute Visit    Patient came in today for Urine Frequency with blood in the Urine, seen in the ED 8/3, given Keflex  and pecid     Pt is a 68 year old male seen for ED follow-up.  Patient states he was seen last week on Friday at [facility] for symptoms.  They became worse over the weekend and he presented to the ED on 07/30/2024 for urinary frequency and hematuria.  UTI noted.  Keflex  started.  Patient feeling better since starting antibiotic.  Denies fever, chills, nausea, vomiting.  Pt was afraid to cancel appointment as he did not want a late cancellation fee or a no show.    Patient Active Problem List   Diagnosis Date Noted   History of Helicobacter infection 03/20/2021   Hx of colonic polyps 03/20/2021   History of lower GI bleeding 03/20/2021   History of gastric ulcer 03/20/2021   Diverticulosis of large intestine with hemorrhage    GI bleed 01/30/2021   Acute lower GI bleeding 01/29/2021   Syncope 01/29/2021   Pure hypercholesterolemia 10/16/2010   Allergic rhinitis 10/24/2007   Essential hypertension 07/25/2007   Past Medical History:  Diagnosis Date   ALLERGIC RHINITIS 10/24/2007   Blood transfusion without reported diagnosis 01/2021   Gastric ulcer    hx of gastric ulcer   GI bleed 01/2021   H. pylori infection    treated   Heart murmur    many years ago- no issues in the 1990's   HYPERLIPIDEMIA 10/16/2010   HYPERTENSION 07/25/2007   Past Surgical History:  Procedure Laterality Date   APPENDECTOMY     BIOPSY  02/01/2021   Procedure: BIOPSY;  Surgeon: Wilhelmenia Aloha Raddle., MD;  Location: [facility] ENDOSCOPY;  Service: Gastroenterology;;   CLAVICLE SURGERY     possibly as a child? unknown if surgery- broke collarbone   COLONOSCOPY WITH PROPOFOL  N/A 02/01/2021   Procedure: COLONOSCOPY WITH  PROPOFOL ;  Surgeon: Wilhelmenia Aloha Raddle., MD;  Location: [facility] ENDOSCOPY;  Service: Gastroenterology;  Laterality: N/A;   ESOPHAGOGASTRODUODENOSCOPY (EGD) WITH PROPOFOL  N/A 02/01/2021   Procedure: ESOPHAGOGASTRODUODENOSCOPY (EGD) WITH PROPOFOL ;  Surgeon: Wilhelmenia Aloha Raddle., MD;  Location: [facility] ENDOSCOPY;  Service: Gastroenterology;  Laterality: N/A;   GIVENS CAPSULE STUDY N/A 02/03/2021   Procedure: GIVENS CAPSULE STUDY;  Surgeon: Leigh Elspeth SQUIBB, MD;  Location: [facility] ENDOSCOPY;  Service: Gastroenterology;  Laterality: N/A;   NASAL SEPTUM SURGERY     septal deviation, turbinate hypertrophy and obstruction   Social History   Tobacco Use   Smoking status: Never   Smokeless tobacco: Never   Tobacco comments:    pt notes he may have had 3-4 cigars yearly  Vaping Use   Vaping status: Never Used  Substance Use Topics   Alcohol use: Yes    Comment: occ   Drug use: No   Family History  Problem Relation Age of Onset   Hypertension Mother    Diabetes Brother    Colon cancer Neg Hx    Esophageal cancer Neg Hx    Pancreatic cancer Neg Hx    Stomach cancer Neg Hx    Liver disease Neg Hx    Hemochromatosis Neg Hx    Rectal cancer Neg Hx    Colon polyps Neg Hx    Allergies  Allergen Reactions  Doxycycline  Other (See Comments)    Headaches    ROS Negative unless stated above    Objective:     BP 138/84 (BP Location: Left Arm, Patient Position: Sitting, Cuff Size: Normal)   Pulse 72   Temp 98.5 F (36.9 C) (Oral)   Ht 5' 11 (1.803 m)   Wt 185 lb 9.6 oz (84.2 kg)   SpO2 97%   BMI 25.89 kg/m  BP Readings from Last 3 Encounters:  07/31/24 138/84  07/30/24 134/84  04/24/24 (!) 146/80   Wt Readings from Last 3 Encounters:  07/31/24 185 lb 9.6 oz (84.2 kg)  07/30/24 184 lb (83.5 kg)  04/24/24 193 lb 12.8 oz (87.9 kg)      Physical Exam Constitutional:      General: He is not in acute distress.    Appearance: Normal appearance.  HENT:     Head: Normocephalic and  atraumatic.     Nose: Nose normal.     Mouth/Throat:     Mouth: Mucous membranes are moist.  Cardiovascular:     Rate and Rhythm: Normal rate and regular rhythm.     Heart sounds: Normal heart sounds. No murmur heard.    No gallop.  Pulmonary:     Effort: Pulmonary effort is normal. No respiratory distress.     Breath sounds: Normal breath sounds. No wheezing, rhonchi or rales.  Skin:    General: Skin is warm and dry.  Neurological:     Mental Status: He is alert and oriented to person, place, and time.        07/31/2024    2:59 PM 04/24/2024    9:48 AM 08/05/2023    3:57 PM  Depression screen PHQ 2/9  Decreased Interest 0 0 2  Down, Depressed, Hopeless 0 0 0  PHQ - 2 Score 0 0 2  Altered sleeping 0 0 0  Tired, decreased energy 0 0 2  Change in appetite 0 0   Feeling bad or failure about yourself  0 0 0  Trouble concentrating 0 1 0  Moving slowly or fidgety/restless 0 0 0  Suicidal thoughts 0 0 0  PHQ-9 Score 0 1 4  Difficult doing work/chores Not difficult at all Not difficult at all Not difficult at all      07/31/2024    2:59 PM 04/24/2024    9:48 AM  GAD 7 : Generalized Anxiety Score  Nervous, Anxious, on Edge 0 0  Control/stop worrying 0 0  Worry too much - different things 0 0  Trouble relaxing 0 0  Restless 0 0  Easily annoyed or irritable 0 0  Afraid - awful might happen 0 0  Total GAD 7 Score 0 0  Anxiety Difficulty Not difficult at all Not difficult at all     No results found for any visits on 07/31/24.    Assessment & Plan:   Acute cystitis with hematuria  Seen in ED 07/30/24 for acute cystitis with hematuria.  Sx improving.  Complete course of Keflex . Increase intake of fluids.  F/u prn.  Return if symptoms worsen or fail to improve.   Clotilda JONELLE Single, MD

## 2024-08-09 ENCOUNTER — Other Ambulatory Visit: Payer: Self-pay | Admitting: Family Medicine

## 2024-08-10 ENCOUNTER — Inpatient Hospital Stay: Admitting: Family Medicine

## 2024-12-06 ENCOUNTER — Ambulatory Visit: Admitting: Family Medicine

## 2024-12-06 ENCOUNTER — Encounter: Payer: Self-pay | Admitting: Family Medicine

## 2024-12-06 VITALS — BP 122/82 | HR 68 | Temp 97.8°F | Ht 71.0 in | Wt 194.6 lb

## 2024-12-06 DIAGNOSIS — R7303 Prediabetes: Secondary | ICD-10-CM | POA: Diagnosis not present

## 2024-12-06 DIAGNOSIS — Z125 Encounter for screening for malignant neoplasm of prostate: Secondary | ICD-10-CM

## 2024-12-06 DIAGNOSIS — Z711 Person with feared health complaint in whom no diagnosis is made: Secondary | ICD-10-CM | POA: Diagnosis not present

## 2024-12-06 DIAGNOSIS — R29898 Other symptoms and signs involving the musculoskeletal system: Secondary | ICD-10-CM | POA: Diagnosis not present

## 2024-12-06 DIAGNOSIS — I10 Essential (primary) hypertension: Secondary | ICD-10-CM

## 2024-12-06 DIAGNOSIS — R3121 Asymptomatic microscopic hematuria: Secondary | ICD-10-CM

## 2024-12-06 LAB — BASIC METABOLIC PANEL WITH GFR
BUN: 13 mg/dL (ref 6–23)
CO2: 28 meq/L (ref 19–32)
Calcium: 9.3 mg/dL (ref 8.4–10.5)
Chloride: 101 meq/L (ref 96–112)
Creatinine, Ser: 0.88 mg/dL (ref 0.40–1.50)
GFR: 88.09 mL/min (ref >60.00)
Glucose, Bld: 94 mg/dL (ref 70–99)
Potassium: 4.6 meq/L (ref 3.5–5.1)
Sodium: 135 meq/L (ref 135–145)

## 2024-12-06 LAB — PSA, MEDICARE: PSA: 0.44 ng/mL (ref 0.10–4.00)

## 2024-12-06 LAB — POC URINALSYSI DIPSTICK (AUTOMATED)
Bilirubin, UA: NEGATIVE
Blood, UA: POSITIVE
Glucose, UA: NEGATIVE
Ketones, UA: NEGATIVE
Leukocytes, UA: NEGATIVE
Nitrite, UA: NEGATIVE
Protein, UA: NEGATIVE
Spec Grav, UA: 1.015 (ref 1.010–1.025)
Urobilinogen, UA: 0.2 U/dL
pH, UA: 5.5 (ref 5.0–8.0)

## 2024-12-06 LAB — POCT GLYCOSYLATED HEMOGLOBIN (HGB A1C): Hemoglobin A1C: 5.7 % — AB (ref 4.0–5.6)

## 2024-12-06 NOTE — Progress Notes (Signed)
 Established Patient Office Visit   Subjective  Patient ID: Jay Robertson., male    DOB: [birth year]  Age: 67 y.o. MRN: [id number]  Chief Complaint  Patient presents with   Medical Management of Chronic Issues    Patient had a follow-up at the hospital in November were they told him he still had blood in his urine and wanted to know if he still has a UTI.    Pt is a 68 yo male seen for f/u.  Pt seen in Aug for UTI at [facility] 8/1 and in ED 8/3.  Followed up with this provider the next day 8/4. Completed abx course.  Went to [facility] 11/14 or 21st to f/u see if he still had it.  Is asymptomatic.  States was told he still had signs of a slight UTI?  Denies overt hematuria, dysuria, back pain, n/v, constipation, fever, chills, frequency.  Drinking 6 bottles of water per day.    Mentions R knee popping just below knee.  Went to UC 2 days ago.  Xrays negative.  Advised likely msk issue.  Taking tylenol  daily for h/o arhtritis in L foot which is helping.  States legs feel tight. Typically lifts wts but does not work on lower body.    Patient Active Problem List   Diagnosis Date Noted   History of Helicobacter infection 03/20/2021   Hx of colonic polyps 03/20/2021   History of lower GI bleeding 03/20/2021   History of gastric ulcer 03/20/2021   Diverticulosis of large intestine with hemorrhage    GI bleed 01/30/2021   Acute lower GI bleeding 01/29/2021   Syncope 01/29/2021   Pure hypercholesterolemia 10/16/2010   Allergic rhinitis 10/24/2007   Essential hypertension 07/25/2007   Past Medical History:  Diagnosis Date   ALLERGIC RHINITIS 10/24/2007   Blood transfusion without reported diagnosis 01/2021   Gastric ulcer    hx of gastric ulcer   GI bleed 01/2021   H. pylori infection    treated   Heart murmur    many years ago- no issues in the 1990's   HYPERLIPIDEMIA 10/16/2010   HYPERTENSION 07/25/2007   Past Surgical History:  Procedure Laterality Date   APPENDECTOMY     BIOPSY   02/01/2021   Procedure: BIOPSY;  Surgeon: Wilhelmenia Aloha Raddle., MD;  Location: [facility] ENDOSCOPY;  Service: Gastroenterology;;   CLAVICLE SURGERY     possibly as a child? unknown if surgery- broke collarbone   COLONOSCOPY WITH PROPOFOL  N/A 02/01/2021   Procedure: COLONOSCOPY WITH PROPOFOL ;  Surgeon: Wilhelmenia Aloha Raddle., MD;  Location: [facility] ENDOSCOPY;  Service: Gastroenterology;  Laterality: N/A;   ESOPHAGOGASTRODUODENOSCOPY (EGD) WITH PROPOFOL  N/A 02/01/2021   Procedure: ESOPHAGOGASTRODUODENOSCOPY (EGD) WITH PROPOFOL ;  Surgeon: Wilhelmenia Aloha Raddle., MD;  Location: [facility] ENDOSCOPY;  Service: Gastroenterology;  Laterality: N/A;   GIVENS CAPSULE STUDY N/A 02/03/2021   Procedure: GIVENS CAPSULE STUDY;  Surgeon: Leigh Elspeth SQUIBB, MD;  Location: [facility] ENDOSCOPY;  Service: Gastroenterology;  Laterality: N/A;   NASAL SEPTUM SURGERY     septal deviation, turbinate hypertrophy and obstruction   Social History   Tobacco Use   Smoking status: Never   Smokeless tobacco: Never   Tobacco comments:    pt notes he may have had 3-4 cigars yearly  Vaping Use   Vaping status: Never Used  Substance Use Topics   Alcohol use: Yes    Comment: occ   Drug use: No   Family History  Problem Relation Age of Onset   Hypertension Mother  Diabetes Brother    Colon cancer Neg Hx    Esophageal cancer Neg Hx    Pancreatic cancer Neg Hx    Stomach cancer Neg Hx    Liver disease Neg Hx    Hemochromatosis Neg Hx    Rectal cancer Neg Hx    Colon polyps Neg Hx    Allergies  Allergen Reactions   Doxycycline  Other (See Comments)    Headaches    ROS Negative unless stated above    Objective:     BP 122/82 (BP Location: Left Arm, Patient Position: Sitting, Cuff Size: Large)   Pulse 68   Temp 97.8 F (36.6 C) (Oral)   Ht 5' 11 (1.803 m)   Wt 194 lb 9.6 oz (88.3 kg)   SpO2 99%   BMI 27.14 kg/m  BP Readings from Last 3 Encounters:  12/06/24 122/82  07/31/24 138/84  07/30/24 134/84   Wt Readings from  Last 3 Encounters:  12/06/24 194 lb 9.6 oz (88.3 kg)  07/31/24 185 lb 9.6 oz (84.2 kg)  07/30/24 184 lb (83.5 kg)      Physical Exam Constitutional:      General: He is not in acute distress.    Appearance: Normal appearance.  HENT:     Head: Normocephalic and atraumatic.     Right Ear: Hearing, tympanic membrane, ear canal and external ear normal. There is no impacted cerumen.     Left Ear: Hearing, tympanic membrane, ear canal and external ear normal. There is no impacted cerumen.     Nose: Nose normal.     Mouth/Throat:     Mouth: Mucous membranes are moist.  Cardiovascular:     Rate and Rhythm: Normal rate and regular rhythm.     Heart sounds: Normal heart sounds. No murmur heard.    No gallop.  Pulmonary:     Effort: Pulmonary effort is normal. No respiratory distress.     Breath sounds: Normal breath sounds. No wheezing, rhonchi or rales.  Abdominal:     Tenderness: There is no abdominal tenderness. There is no right CVA tenderness or left CVA tenderness.  Skin:    General: Skin is warm and dry.  Neurological:     Mental Status: He is alert and oriented to person, place, and time.  Psychiatric:        Behavior: Behavior is cooperative.        12/06/2024   10:00 AM 07/31/2024    2:59 PM 04/24/2024    9:48 AM  Depression screen PHQ 2/9  Decreased Interest 0 0 0  Down, Depressed, Hopeless 0 0 0  PHQ - 2 Score 0 0 0  Altered sleeping 0 0 0  Tired, decreased energy 0 0 0  Change in appetite 0 0 0  Feeling bad or failure about yourself  0 0 0  Trouble concentrating 1 0 1  Moving slowly or fidgety/restless 0 0 0  Suicidal thoughts 0 0 0  PHQ-9 Score 1 0  1   Difficult doing work/chores Not difficult at all Not difficult at all Not difficult at all     Data saved with a previous flowsheet row definition      12/06/2024   10:00 AM 07/31/2024    2:59 PM 04/24/2024    9:48 AM  GAD 7 : Generalized Anxiety Score  Nervous, Anxious, on Edge 1 0 0  Control/stop  worrying 0 0 0  Worry too much - different things 0 0 0  Trouble relaxing  0 0 0  Restless 0 0 0  Easily annoyed or irritable 0 0 0  Afraid - awful might happen 0 0 0  Total GAD 7 Score 1 0 0  Anxiety Difficulty Not difficult at all Not difficult at all Not difficult at all     Results for orders placed or performed in visit on 12/06/24  POCT Urinalysis Dipstick (Automated)  Result Value Ref Range   Color, UA yellow    Clarity, UA clear    Glucose, UA Negative Negative   Bilirubin, UA Negative    Ketones, UA Negative    Spec Grav, UA 1.015 1.010 - 1.025   Blood, UA Positive 1+    pH, UA 5.5 5.0 - 8.0   Protein, UA Negative Negative   Urobilinogen, UA 0.2 0.2 or 1.0 E.U./dL   Nitrite, UA neg    Leukocytes, UA Negative Negative  POC HgB A1c  Result Value Ref Range   Hemoglobin A1C 5.7 (A) 4.0 - 5.6 %   HbA1c POC (<> result, manual entry)     HbA1c, POC (prediabetic range)     HbA1c, POC (controlled diabetic range)        Assessment & Plan:   Asymptomatic microscopic hematuria -     Urinalysis, Routine w reflex microscopic -     Ambulatory referral to Urology -     Basic metabolic panel with GFR; Future -     PSA, Medicare; Future  Concern about urinary tract disease without diagnosis -     POCT Urinalysis Dipstick (Automated)  Essential hypertension -     Basic metabolic panel with GFR; Future  Prediabetes -     POCT glycosylated hemoglobin (Hb A1C)  Popping sound of knee joint  Screening for prostate cancer -     PSA, Medicare; Future   Patient with urinary concerns.  POC UA with 1+ RBCs.  No signs of infection noted.  Obtain microscopic analysis.  Given continued hematuria referred to urology.  Labs ordered to further evaluate hematuria and renal function.  BP stable.  Continue current medications and lifestyle modifications.  Hemoglobin A1c 5.7% this visit which remains in the prediabetic range.  Work on diet changes.  Popping of right knee likely from muscle  tightness.  Discussed exercises and stretching to improve symptoms.  Continue follow-up with Ortho if needed as recent workup including imaging negative.  Return if symptoms worsen or fail to improve.   Clotilda JONELLE Single, MD

## 2024-12-06 NOTE — Patient Instructions (Signed)
 No signs of urinary tract infection from urinalysis.  A small amount of blood is still present.  Given this we will place a referral to send you to the urologist to evaluate further.  Orders for labs to check your kidney function and to screen for prostate cancer were placed.  You can have these done while you are here in clinic.  Your hemoglobin A1c was 5.7% this visit.  This is just within the prediabetic range is 5.6% and below is normal.  Decrease intake of rice, bread, Pasta, potatoes.  Work on exercises to strengthen your lower body.

## 2024-12-07 ENCOUNTER — Ambulatory Visit: Payer: Self-pay | Admitting: Family Medicine

## 2024-12-07 LAB — URINALYSIS, ROUTINE W REFLEX MICROSCOPIC
Bilirubin Urine: NEGATIVE
Ketones, ur: NEGATIVE
Leukocytes,Ua: NEGATIVE
Nitrite: NEGATIVE
Specific Gravity, Urine: 1.01 (ref 1.000–1.030)
Total Protein, Urine: NEGATIVE
Urine Glucose: NEGATIVE
Urobilinogen, UA: 0.2 (ref 0.0–1.0)
pH: 6 (ref 5.0–8.0)

## 2025-01-06 ENCOUNTER — Other Ambulatory Visit: Payer: Self-pay | Admitting: Family Medicine

## 2025-01-06 DIAGNOSIS — I10 Essential (primary) hypertension: Secondary | ICD-10-CM

## 2025-01-16 ENCOUNTER — Ambulatory Visit: Admitting: Family Medicine

## 2025-03-26 ENCOUNTER — Ambulatory Visit
